# Patient Record
Sex: Female | Born: 1965
Health system: Southern US, Community
[De-identification: ages and names within clinical notes are randomized; demographics above are authoritative.]

## PROBLEM LIST (undated history)

## (undated) DIAGNOSIS — E550 Rickets, active: Secondary | ICD-10-CM

## (undated) DIAGNOSIS — T7840XA Allergy, unspecified, initial encounter: Secondary | ICD-10-CM

## (undated) DIAGNOSIS — E039 Hypothyroidism, unspecified: Secondary | ICD-10-CM

## (undated) DIAGNOSIS — M1990 Unspecified osteoarthritis, unspecified site: Secondary | ICD-10-CM

## (undated) DIAGNOSIS — I10 Essential (primary) hypertension: Secondary | ICD-10-CM

## (undated) HISTORY — PX: TUBAL LIGATION: SHX77

## (undated) HISTORY — DX: Unspecified osteoarthritis, unspecified site: M19.90

## (undated) HISTORY — DX: Rickets, active: E55.0

## (undated) HISTORY — DX: Allergy, unspecified, initial encounter: T78.40XA

## (undated) HISTORY — PX: LEG SURGERY: SHX1003

---

## 2012-08-06 ENCOUNTER — Other Ambulatory Visit: Payer: Self-pay | Admitting: Orthopedic Surgery

## 2012-08-06 MED ORDER — DEXAMETHASONE SODIUM PHOSPHATE 10 MG/ML IJ SOLN: 10.0000 mg | Freq: Once | INTRAMUSCULAR | Status: DC

## 2012-08-06 NOTE — Progress Notes (Signed)
Preoperative surgical orders have been place into the Epic hospital system for Rebecca Miles on 08/06/2012, 5:43 PM  by Patrica Duel for surgery on 09/26/2012.  Preop Femur orders including IV Tylenol, and IV Decadron as long as there are no contraindications to the above medications. Avel Peace, PA-C

## 2012-09-12 ENCOUNTER — Other Ambulatory Visit: Payer: Self-pay | Admitting: Orthopedic Surgery

## 2012-09-12 ENCOUNTER — Encounter (HOSPITAL_COMMUNITY): Payer: Self-pay | Admitting: Pharmacy Technician

## 2012-09-17 ENCOUNTER — Ambulatory Visit (HOSPITAL_COMMUNITY)
Admission: RE | Admit: 2012-09-17 | Discharge: 2012-09-17 | Disposition: A | Discharge status: Home / Self Care | Payer: PRIVATE HEALTH INSURANCE | Source: Ambulatory Visit | Attending: Orthopedic Surgery | Admitting: Orthopedic Surgery

## 2012-09-17 ENCOUNTER — Encounter (HOSPITAL_COMMUNITY): Payer: Self-pay

## 2012-09-17 ENCOUNTER — Encounter (HOSPITAL_COMMUNITY)
Admission: RE | Admit: 2012-09-17 | Discharge: 2012-09-17 | Disposition: A | Discharge status: Home / Self Care | Payer: PRIVATE HEALTH INSURANCE | Source: Ambulatory Visit | Attending: Orthopedic Surgery | Admitting: Orthopedic Surgery

## 2012-09-17 DIAGNOSIS — M4120 Other idiopathic scoliosis, site unspecified: Secondary | ICD-10-CM | POA: Insufficient documentation

## 2012-09-17 DIAGNOSIS — I10 Essential (primary) hypertension: Secondary | ICD-10-CM | POA: Insufficient documentation

## 2012-09-17 DIAGNOSIS — Z01818 Encounter for other preprocedural examination: Secondary | ICD-10-CM | POA: Insufficient documentation

## 2012-09-17 DIAGNOSIS — Z01812 Encounter for preprocedural laboratory examination: Secondary | ICD-10-CM | POA: Insufficient documentation

## 2012-09-17 HISTORY — DX: Essential (primary) hypertension: I10

## 2012-09-17 HISTORY — DX: Hypothyroidism, unspecified: E03.9

## 2012-09-17 HISTORY — DX: Unspecified osteoarthritis, unspecified site: M19.90

## 2012-09-17 LAB — URINALYSIS, ROUTINE W REFLEX MICROSCOPIC
Bilirubin Urine: NEGATIVE
Ketones, ur: NEGATIVE mg/dL
Nitrite: NEGATIVE
pH: 6 (ref 5.0–8.0)

## 2012-09-17 LAB — URINE MICROSCOPIC-ADD ON

## 2012-09-17 LAB — CBC
HCT: 40.9 % (ref 36.0–46.0)
MCV: 86.3 fL (ref 78.0–100.0)
Platelets: 434 10*3/uL — ABNORMAL HIGH (ref 150–400)
RBC: 4.74 MIL/uL (ref 3.87–5.11)
WBC: 8 10*3/uL (ref 4.0–10.5)

## 2012-09-17 LAB — COMPREHENSIVE METABOLIC PANEL
AST: 16 U/L (ref 0–37)
Alkaline Phosphatase: 83 U/L (ref 39–117)
BUN: 7 mg/dL (ref 6–23)
CO2: 28 mEq/L (ref 19–32)
Chloride: 100 mEq/L (ref 96–112)
Creatinine, Ser: 0.62 mg/dL (ref 0.50–1.10)
GFR calc non Af Amer: >90 mL/min (ref >90)
Potassium: 3.5 mEq/L (ref 3.5–5.1)
Total Bilirubin: 0.5 mg/dL (ref 0.3–1.2)

## 2012-09-17 LAB — PROTIME-INR: INR: 1.04 (ref 0.00–1.49)

## 2012-09-17 LAB — SURGICAL PCR SCREEN: Staphylococcus aureus: NEGATIVE

## 2012-09-17 NOTE — Patient Instructions (Signed)
20 Rebecca Miles  09/17/2012   Your procedure is scheduled on: 09/26/12   Report to [facility] at 1015 AM.  Call this number if you have problems the morning of surgery: [phone]   Remember:   Do not eat food:After Midnight.  May have clear liquids:until 0600am then npo  Clear liquids include soda, tea, black coffee, apple or grape juice, broth.  Take these medicines the morning of surgery with A SIP OF WATER:   Do not wear jewelry, make-up or nail polish.  Do not wear lotions, powders, or perfumes.  .  Do not shave 48 hours prior to surgery.    Do not bring valuables to the hospital.  Contacts, dentures or bridgework may not be worn into surgery.  Leave suitcase in the car. After surgery it may be brought to your room.  For patients admitted to the hospital, checkout time is 11:00 AM the day of discharge.         Special Instructions: Incentive Spirometry - Practice and bring it with you on the day of surgery.   Please read over the following fact sheets that you were given: MRSA Information, coughing and deep breathing exercises, leg exercises, Incentive Spirometry Fact Sheet, Blood Transfusion Fact Sheet              Failure to comply with these instructions may result in cancellation of your surgery.                 Patient Signature _______________________________              Nurse signature ________________________________

## 2012-09-18 NOTE — Progress Notes (Signed)
Urinalysis with micro results faxed via EPIC to Dr Lequita Halt.

## 2012-09-19 NOTE — Progress Notes (Signed)
Dr Okey Dupre evaluated confirmed ekg .  No prior EKG done at office of PCP.  No further orders given.

## 2012-09-24 ENCOUNTER — Other Ambulatory Visit: Payer: Self-pay | Admitting: Orthopedic Surgery

## 2012-09-24 NOTE — H&P (Signed)
Rebecca Miles  DOB: 07/09/1966 Separated / Language: English / Race: Black or African American Female  Date of Admission:  09/26/2012  Chief Complaint:  Right Femur Deformity  History of Present Illness The patient is a 47 year old female who comes in for a preoperative History and Physical. The patient is scheduled for a right femoral corrective osteotomy with retrograde IM femoral nailing to be performed by Dr. Frank V. Aluisio, MD at [facility] on 09/26/2012. The patient is a 47 year old female who presents with knee complaints. The patient is seen for a second opinion. The patient reports left knee and right knee symptoms including: pain, giving way, weakness and soreness which began year(s) ago. The patient has the current diagnosis of knee osteoarthritis. Prior to being seen today the patient was previously evaluated by a colleague (Dr. Bassett at [facility]). Previous work-up for this problem has included knee x-rays. Past treatment for this problem has included intra-articular injection of corticosteroids (last injections 04/26/12). Note for "Knee pain": She has been using a cane to ambulate because the knees are bowing so much now. 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 She brought her xrays on a disc today. She's had significant congenital issues with both legs. 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 She's had one operation on her left tibia and two on the right for correction of angular deformity. She's had no problems since then with the tibiae. She still has significant bowing of the femurs. She has significant pain in both knees, right worse than left. It is occurring with activity and with rest. She does not get swelling. She had injections which provided very short-term benefit. Currently the knees are preventing her from doing what she desires. 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  She's not having any hip pain associated with this. No lower extremity weakness or paresthesias. She would like to proceed with surgery to correct her legs. They have been treated conservatively in the past for the above stated problem and despite conservative measures, they continue to have progressive pain and severe functional limitations and dysfunction. They have failed non-operative management including home exercise, medications, and injections. It is felt that they would benefit from undergoing total joint replacement. Risks and benefits of the procedure have been discussed with the patient and they elect to proceed with surgery. There are no active contraindications to surgery such as ongoing infection or rapidly progressive neurological disease.   Problem List Primary osteoarthritis of both knees (715.16) Vitiligo (709.01)   Allergies No Known Drug Allergies   Family History Hypertension. mother and grandmother mothers side Kidney disease. mother Osteoarthritis. grandmother mothers side Diabetes Mellitus. mother Drug / Alcohol Addiction. brother Rheumatoid Arthritis. grandmother mothers side Father. Parkinsonism. Mother. Hypertension, Chronic kidney disease, Hepatitis C. Children. 3 Children (3 sons) with Rickets   Social History Living situation. Lives with family Current work status. disabled Drug/Alcohol Rehab (Currently). no Drug/Alcohol Rehab (Previously). no Marital status. married Tobacco use. former smoker; smoke(d) 1/2 pack(s) per day Number of flights of stairs before winded. 2-3 Pain Contract. no Tobacco / smoke exposure. no Children. 4 Alcohol use. current drinker; drinks wine; only occasionally per week Post-Surgical Plans. Plan is to go home. Current occupation. Certified Nursing Assistant (CNA)   Medication History Armour Thyroid (120MG Tablet, Oral) Active. Diovan HCT (160-12.5MG Tablet, Oral) Active. Multivitamins (  Oral) Active. Ibuprofen (800MG Tablet,   Oral) Active. Tylenol Arthritis Pain (650MG Tablet ER, Oral) Active.  Past Surgical History Tubal Ligation Corrective Left Leg Surgery. Date: 1975. [facility] Corrective Right Leg Surgery. Date: 1982. [facility]   Medical History Rheumatoid Arthritis High blood pressure Osteoarthritis Menopause   Review of Systems General:Not Present- Chills, Fever, Night Sweats, Fatigue, Weight Gain, Weight Loss and Memory Loss. Skin:Not Present- Hives, Itching, Rash, Eczema and Lesions. HEENT:Not Present- Tinnitus, Headache, Double Vision, Visual Loss, Hearing Loss and Dentures. Respiratory:Not Present- Shortness of breath with exertion, Shortness of breath at rest, Allergies, Coughing up blood and Chronic Cough. Cardiovascular:Present- Swelling. Not Present- Chest Pain, Racing/skipping heartbeats, Difficulty Breathing Lying Down, Murmur and Palpitations. Gastrointestinal:Not Present- Bloody Stool, Heartburn, Abdominal Pain, Vomiting, Nausea, Constipation, Diarrhea, Difficulty Swallowing, Jaundice and Loss of appetitie. Female Genitourinary:Present- Urinary frequency. Not Present- Blood in Urine, Weak urinary stream, Discharge, Flank Pain, Incontinence, Painful Urination, Urgency, Urinary Retention and Urinating at Night. Musculoskeletal:Present- Muscle Weakness, Joint Swelling, Joint Pain and Morning Stiffness. Not Present- Muscle Pain, Back Pain and Spasms. Neurological:Not Present- Tremor, Dizziness, Blackout spells, Paralysis, Difficulty with balance and Weakness. Psychiatric:Not Present- Insomnia.   Vitals Weight: 223 lb Height: 58 in Weight was reported by patient. Height was reported by patient. Body Surface Area: 2.03 m Body Mass Index: 46.61 kg/m Pulse: 88 (Regular) Resp.: 16 (Unlabored) BP: 136/84 (Sitting, Right Arm, Standard)    Physical Exam The physical exam findings are as follows:  Note:  Patient is a 47 year old female with continued knee pain. Patient is accompanied today by daughter Jessica and her granddaughter.   General Mental Status - Alert, cooperative and good historian. General Appearance- pleasant. Not in acute distress. Orientation- Oriented X3. Build & Nutrition- Overweight, Well nourished and Well developed. Gait- abnormal and Use of assistive device (4 prong cane).   Head and Neck Head- normocephalic, atraumatic . Neck Global Assessment- supple. no bruit auscultated on the right and no bruit auscultated on the left.   Eye Pupil- Bilateral- Regular and Round. Motion- Bilateral- EOMI.   Chest and Lung Exam Auscultation: Breath sounds:- clear at anterior chest wall and - clear at posterior chest wall. Adventitious sounds:- No Adventitious sounds.   Cardiovascular Auscultation:Rhythm- Regular rate and rhythm. Heart Sounds- S1 WNL and S2 WNL. Murmurs & Other Heart Sounds:Auscultation of the heart reveals - No Murmurs.   Abdomen Inspection:Contour- Generalized moderate distention. Palpation/Percussion:Tenderness- Abdomen is non-tender to palpation. Rigidity (guarding)- Abdomen is soft. Auscultation:Auscultation of the abdomen reveals - Bowel sounds normal.   Female Genitourinary Not done, not pertinent to present illness  Musculoskeletal  Very pleasant, well-developed female, alert and oriented, in no apparent distress. Evaluation of her hips shows normal motion, no discomfort. While standing, she has profound bowing in both femurs. She has procurvatum and varus of the femurs. Her knees show no effusion. There is significant varus at the right knee, range 5-110, marked crepitus on ROM, tender medial greater than lateral with no instability noted. There is left knee varus but to a lesser degree. Range is about 5-120. Moderate crepitus on ROM. Slight tenderness and no instability noted. Pulses, sensation and  motor are intact, both lower extremities. She walks with a horribly antalgic gait pattern.  RADIOGRAPHS: AP and lateral of both femurs, AP and lateral of both knees, show significant angular deformities of about 20 degrees in both femurs. Once again, there is a varus and procurvatum of both femurs, worse on the right than the left. Knees show horrific arthritis, bone on bone, medial and patellofemoral   with large varus deformity, worse on the right than the left.  Assessment & Plan Primary osteoarthritis of both knees (715.16) Bilateral Femoral Deformities  Note: Plan is for a right femoral corrective osteotomy with retrograde IM femoral nialing by Dr. Aluisio.  Plan is to go home following the surgery.  PCP - Dr. Vita Bland  Signed electronically by DREW L PERKINS, PA-C  

## 2012-09-26 ENCOUNTER — Ambulatory Visit (HOSPITAL_COMMUNITY): Payer: PRIVATE HEALTH INSURANCE | Admitting: Anesthesiology

## 2012-09-26 ENCOUNTER — Encounter (HOSPITAL_COMMUNITY): Payer: Self-pay | Admitting: *Deleted

## 2012-09-26 ENCOUNTER — Encounter (HOSPITAL_COMMUNITY)
Admission: RE | Disposition: A | Discharge status: Home Health | Payer: Self-pay | Source: Ambulatory Visit | Attending: Orthopedic Surgery

## 2012-09-26 ENCOUNTER — Ambulatory Visit (HOSPITAL_COMMUNITY): Payer: PRIVATE HEALTH INSURANCE

## 2012-09-26 ENCOUNTER — Encounter (HOSPITAL_COMMUNITY): Payer: Self-pay | Admitting: Anesthesiology

## 2012-09-26 ENCOUNTER — Inpatient Hospital Stay (HOSPITAL_COMMUNITY)
Admission: RE | Admit: 2012-09-26 | Discharge: 2012-09-28 | DRG: 482 | Disposition: A | Discharge status: Home Health | Payer: PRIVATE HEALTH INSURANCE | Source: Ambulatory Visit | Attending: Orthopedic Surgery | Admitting: Orthopedic Surgery

## 2012-09-26 DIAGNOSIS — Q683 Congenital bowing of femur: Secondary | ICD-10-CM

## 2012-09-26 DIAGNOSIS — Q742 Other congenital malformations of lower limb(s), including pelvic girdle: Secondary | ICD-10-CM

## 2012-09-26 DIAGNOSIS — I10 Essential (primary) hypertension: Secondary | ICD-10-CM | POA: Diagnosis present

## 2012-09-26 DIAGNOSIS — Z79899 Other long term (current) drug therapy: Secondary | ICD-10-CM

## 2012-09-26 DIAGNOSIS — Z87891 Personal history of nicotine dependence: Secondary | ICD-10-CM

## 2012-09-26 DIAGNOSIS — L80 Vitiligo: Secondary | ICD-10-CM | POA: Diagnosis present

## 2012-09-26 DIAGNOSIS — M179 Osteoarthritis of knee, unspecified: Secondary | ICD-10-CM

## 2012-09-26 DIAGNOSIS — M069 Rheumatoid arthritis, unspecified: Secondary | ICD-10-CM | POA: Diagnosis present

## 2012-09-26 DIAGNOSIS — E039 Hypothyroidism, unspecified: Secondary | ICD-10-CM | POA: Diagnosis present

## 2012-09-26 DIAGNOSIS — M1710 Unilateral primary osteoarthritis, unspecified knee: Principal | ICD-10-CM | POA: Diagnosis present

## 2012-09-26 HISTORY — PX: TIBIA OSTEOTOMY: SHX1065

## 2012-09-26 HISTORY — PX: FEMUR IM NAIL: SHX1597

## 2012-09-26 LAB — TYPE AND SCREEN
ABO/RH(D): A POS
Antibody Screen: NEGATIVE

## 2012-09-26 LAB — ABO/RH: ABO/RH(D): A POS

## 2012-09-26 SURGERY — OSTEOTOMY, TIBIA
Anesthesia: General | Site: Leg Upper | Laterality: Right

## 2012-09-26 MED ORDER — METOCLOPRAMIDE HCL 5 MG/ML IJ SOLN: 5.0000 mg | Freq: Three times a day (TID) | INTRAMUSCULAR | Status: DC | PRN

## 2012-09-26 MED ORDER — MIDAZOLAM HCL 5 MG/5ML IJ SOLN
INTRAMUSCULAR | Status: DC | PRN
  Administered 2012-09-26: 2 mg via INTRAVENOUS

## 2012-09-26 MED ORDER — KCL IN DEXTROSE-NACL 20-5-0.9 MEQ/L-%-% IV SOLN
INTRAVENOUS | Status: DC
  Administered 2012-09-26 – 2012-09-27 (×2): via INTRAVENOUS
  Filled 2012-09-26 (×4): qty 1000

## 2012-09-26 MED ORDER — CHLORHEXIDINE GLUCONATE 4 % EX LIQD
60.0000 mL | Freq: Once | CUTANEOUS | Status: DC
  Filled 2012-09-26: qty 60

## 2012-09-26 MED ORDER — IRBESARTAN 150 MG PO TABS
150.0000 mg | ORAL_TABLET | Freq: Every day | ORAL | Status: DC
  Administered 2012-09-27 – 2012-09-28 (×2): 150 mg via ORAL
  Filled 2012-09-26 (×2): qty 1

## 2012-09-26 MED ORDER — DEXAMETHASONE SODIUM PHOSPHATE 10 MG/ML IJ SOLN
INTRAMUSCULAR | Status: DC | PRN
  Administered 2012-09-26: 10 mg via INTRAVENOUS

## 2012-09-26 MED ORDER — 0.9 % SODIUM CHLORIDE (POUR BTL) OPTIME
TOPICAL | Status: DC | PRN
  Administered 2012-09-26: 1000 mL

## 2012-09-26 MED ORDER — FLEET ENEMA 7-19 GM/118ML RE ENEM: 1.0000 | ENEMA | Freq: Once | RECTAL | Status: AC | PRN

## 2012-09-26 MED ORDER — CISATRACURIUM BESYLATE (PF) 10 MG/5ML IV SOLN
INTRAVENOUS | Status: DC | PRN
  Administered 2012-09-26: 10 mg via INTRAVENOUS

## 2012-09-26 MED ORDER — OXYCODONE HCL 5 MG PO TABS
5.0000 mg | ORAL_TABLET | ORAL | Status: DC | PRN
  Administered 2012-09-27: 5 mg via ORAL
  Administered 2012-09-27 (×2): 10 mg via ORAL
  Administered 2012-09-27: 5 mg via ORAL
  Administered 2012-09-27 – 2012-09-28 (×2): 10 mg via ORAL
  Filled 2012-09-26 (×5): qty 2
  Filled 2012-09-26: qty 1

## 2012-09-26 MED ORDER — LIDOCAINE HCL (CARDIAC) 20 MG/ML IV SOLN
INTRAVENOUS | Status: DC | PRN
  Administered 2012-09-26: 50 mg via INTRAVENOUS
  Administered 2012-09-26: 100 mg via INTRAVENOUS

## 2012-09-26 MED ORDER — PROPOFOL 10 MG/ML IV BOLUS
INTRAVENOUS | Status: DC | PRN
  Administered 2012-09-26: 200 mg via INTRAVENOUS

## 2012-09-26 MED ORDER — POLYETHYLENE GLYCOL 3350 17 G PO PACK: 17.0000 g | PACK | Freq: Every day | ORAL | Status: DC | PRN

## 2012-09-26 MED ORDER — TRAMADOL HCL 50 MG PO TABS
50.0000 mg | ORAL_TABLET | Freq: Four times a day (QID) | ORAL | Status: DC | PRN
  Administered 2012-09-26: 100 mg via ORAL
  Filled 2012-09-26: qty 2

## 2012-09-26 MED ORDER — VALSARTAN-HYDROCHLOROTHIAZIDE 160-12.5 MG PO TABS: 1.0000 | ORAL_TABLET | Freq: Every day | ORAL | Status: DC

## 2012-09-26 MED ORDER — ACETAMINOPHEN 10 MG/ML IV SOLN
1000.0000 mg | Freq: Once | INTRAVENOUS | Status: AC
  Administered 2012-09-26: 1000 mg via INTRAVENOUS

## 2012-09-26 MED ORDER — ONDANSETRON HCL 4 MG/2ML IJ SOLN
INTRAMUSCULAR | Status: DC | PRN
  Administered 2012-09-26: 4 mg via INTRAVENOUS

## 2012-09-26 MED ORDER — LACTATED RINGERS IV SOLN
INTRAVENOUS | Status: DC
  Administered 2012-09-26: 1000 mL via INTRAVENOUS

## 2012-09-26 MED ORDER — METHYLPREDNISOLONE ACETATE 40 MG/ML IJ SUSP
INTRAMUSCULAR | Status: AC
  Filled 2012-09-26: qty 1

## 2012-09-26 MED ORDER — ENOXAPARIN SODIUM 40 MG/0.4ML ~~LOC~~ SOLN
40.0000 mg | SUBCUTANEOUS | Status: DC
  Administered 2012-09-27 – 2012-09-28 (×2): 40 mg via SUBCUTANEOUS
  Filled 2012-09-26 (×3): qty 0.4

## 2012-09-26 MED ORDER — ONDANSETRON HCL 4 MG PO TABS: 4.0000 mg | ORAL_TABLET | Freq: Four times a day (QID) | ORAL | Status: DC | PRN

## 2012-09-26 MED ORDER — CEFAZOLIN SODIUM-DEXTROSE 2-3 GM-% IV SOLR
2.0000 g | INTRAVENOUS | Status: AC
  Administered 2012-09-26: 2 g via INTRAVENOUS

## 2012-09-26 MED ORDER — MORPHINE SULFATE 2 MG/ML IJ SOLN
1.0000 mg | INTRAMUSCULAR | Status: DC | PRN
  Administered 2012-09-26: 2 mg via INTRAVENOUS
  Administered 2012-09-26: 1 mg via INTRAVENOUS
  Administered 2012-09-26: 2 mg via INTRAVENOUS
  Filled 2012-09-26 (×3): qty 1

## 2012-09-26 MED ORDER — SUCCINYLCHOLINE CHLORIDE 20 MG/ML IJ SOLN
INTRAMUSCULAR | Status: DC | PRN
  Administered 2012-09-26: 100 mg via INTRAVENOUS

## 2012-09-26 MED ORDER — CEFAZOLIN SODIUM-DEXTROSE 2-3 GM-% IV SOLR
2.0000 g | Freq: Four times a day (QID) | INTRAVENOUS | Status: AC
  Administered 2012-09-26 – 2012-09-27 (×3): 2 g via INTRAVENOUS
  Filled 2012-09-26 (×4): qty 50

## 2012-09-26 MED ORDER — BISACODYL 10 MG RE SUPP: 10.0000 mg | Freq: Every day | RECTAL | Status: DC | PRN

## 2012-09-26 MED ORDER — CEFAZOLIN SODIUM-DEXTROSE 2-3 GM-% IV SOLR
INTRAVENOUS | Status: AC
  Filled 2012-09-26: qty 50

## 2012-09-26 MED ORDER — ONDANSETRON HCL 4 MG/2ML IJ SOLN: 4.0000 mg | Freq: Four times a day (QID) | INTRAMUSCULAR | Status: DC | PRN

## 2012-09-26 MED ORDER — METOCLOPRAMIDE HCL 10 MG PO TABS: 5.0000 mg | ORAL_TABLET | Freq: Three times a day (TID) | ORAL | Status: DC | PRN

## 2012-09-26 MED ORDER — METHYLPREDNISOLONE ACETATE 80 MG/ML IJ SUSP
INTRAMUSCULAR | Status: DC | PRN
  Administered 2012-09-26: 80 mg via INTRA_ARTICULAR

## 2012-09-26 MED ORDER — THYROID 60 MG PO TABS
120.0000 mg | ORAL_TABLET | Freq: Every day | ORAL | Status: DC
Start: 2012-09-27 — End: 2012-09-28
  Administered 2012-09-27 – 2012-09-28 (×2): 120 mg via ORAL
  Filled 2012-09-26 (×3): qty 2

## 2012-09-26 MED ORDER — METHOCARBAMOL 100 MG/ML IJ SOLN
500.0000 mg | Freq: Four times a day (QID) | INTRAVENOUS | Status: DC | PRN
  Administered 2012-09-26: 500 mg via INTRAVENOUS
  Filled 2012-09-26: qty 5

## 2012-09-26 MED ORDER — ACETAMINOPHEN 10 MG/ML IV SOLN
INTRAVENOUS | Status: AC
  Filled 2012-09-26: qty 100

## 2012-09-26 MED ORDER — HYDROMORPHONE HCL PF 1 MG/ML IJ SOLN
INTRAMUSCULAR | Status: DC | PRN
  Administered 2012-09-26 (×2): .4 mg via INTRAVENOUS
  Administered 2012-09-26 (×2): .6 mg via INTRAVENOUS

## 2012-09-26 MED ORDER — STERILE WATER FOR IRRIGATION IR SOLN
Status: DC | PRN
  Administered 2012-09-26: 3000 mL

## 2012-09-26 MED ORDER — HYDROMORPHONE HCL PF 1 MG/ML IJ SOLN
INTRAMUSCULAR | Status: AC
  Filled 2012-09-26: qty 1

## 2012-09-26 MED ORDER — KETAMINE HCL 10 MG/ML IJ SOLN
INTRAMUSCULAR | Status: DC | PRN
  Administered 2012-09-26: 30 mg via INTRAVENOUS
  Administered 2012-09-26 (×2): 10 mg via INTRAVENOUS

## 2012-09-26 MED ORDER — LACTATED RINGERS IV SOLN
INTRAVENOUS | Status: DC | PRN
  Administered 2012-09-26 (×3): via INTRAVENOUS

## 2012-09-26 MED ORDER — BUPIVACAINE HCL (PF) 0.25 % IJ SOLN
INTRAMUSCULAR | Status: AC
  Filled 2012-09-26: qty 30

## 2012-09-26 MED ORDER — HYDROCHLOROTHIAZIDE 12.5 MG PO CAPS
12.5000 mg | ORAL_CAPSULE | Freq: Every day | ORAL | Status: DC
  Administered 2012-09-27 – 2012-09-28 (×2): 12.5 mg via ORAL
  Filled 2012-09-26 (×2): qty 1

## 2012-09-26 MED ORDER — HYDROMORPHONE HCL PF 1 MG/ML IJ SOLN
0.2500 mg | INTRAMUSCULAR | Status: DC | PRN
  Administered 2012-09-26: 0.5 mg via INTRAVENOUS
  Administered 2012-09-26: 0.25 mg via INTRAVENOUS
  Administered 2012-09-26: 0.5 mg via INTRAVENOUS

## 2012-09-26 MED ORDER — METHOCARBAMOL 500 MG PO TABS
500.0000 mg | ORAL_TABLET | Freq: Four times a day (QID) | ORAL | Status: DC | PRN
  Administered 2012-09-27 – 2012-09-28 (×3): 500 mg via ORAL
  Filled 2012-09-26 (×3): qty 1

## 2012-09-26 MED ORDER — BUPIVACAINE HCL (PF) 0.25 % IJ SOLN
INTRAMUSCULAR | Status: DC | PRN
  Administered 2012-09-26: 5 mL via INTRA_ARTICULAR

## 2012-09-26 MED ORDER — DOCUSATE SODIUM 100 MG PO CAPS
100.0000 mg | ORAL_CAPSULE | Freq: Two times a day (BID) | ORAL | Status: DC
  Administered 2012-09-26 – 2012-09-28 (×4): 100 mg via ORAL

## 2012-09-26 MED ORDER — SODIUM CHLORIDE 0.9 % IV SOLN: INTRAVENOUS | Status: DC

## 2012-09-26 MED ORDER — SUFENTANIL CITRATE 50 MCG/ML IV SOLN
INTRAVENOUS | Status: DC | PRN
  Administered 2012-09-26 (×2): 10 ug via INTRAVENOUS
  Administered 2012-09-26: 20 ug via INTRAVENOUS
  Administered 2012-09-26: 10 ug via INTRAVENOUS

## 2012-09-26 SURGICAL SUPPLY — 65 items
BAG ZIPLOCK 12X15 (MISCELLANEOUS) ×3 IMPLANT
BANDAGE ELASTIC 6 VELCRO ST LF (GAUZE/BANDAGES/DRESSINGS) ×3 IMPLANT
BANDAGE ESMARK 6X9 LF (GAUZE/BANDAGES/DRESSINGS) ×2 IMPLANT
BANDAGE GAUZE ELAST BULKY 4 IN (GAUZE/BANDAGES/DRESSINGS) ×3 IMPLANT
BIT DRILL CALIBRATED 4.3MMX365 (DRILL) ×4 IMPLANT
BIT DRILL CROWE PNT TWST 4.5MM (DRILL) ×2 IMPLANT
BLADE SAW SGTL 18X1.27X75 (BLADE) ×3 IMPLANT
BNDG COHESIVE 4X5 TAN STRL (GAUZE/BANDAGES/DRESSINGS) ×3 IMPLANT
BNDG ESMARK 6X9 LF (GAUZE/BANDAGES/DRESSINGS) ×3
CLOTH BEACON ORANGE TIMEOUT ST (SAFETY) ×3 IMPLANT
CLSR STERI-STRIP ANTIMIC 1/2X4 (GAUZE/BANDAGES/DRESSINGS) ×3 IMPLANT
DRAPE EXTREMITY T 121X128X90 (DRAPE) ×3 IMPLANT
DRAPE INCISE IOBAN 66X45 STRL (DRAPES) ×3 IMPLANT
DRAPE LG THREE QUARTER DISP (DRAPES) ×6 IMPLANT
DRAPE STERI IOBAN 125X83 (DRAPES) ×3 IMPLANT
DRAPE U-SHAPE 47X51 STRL (DRAPES) ×3 IMPLANT
DRILL CALIBRATED 4.3MMX365 (DRILL) ×6
DRILL CROWE POINT TWIST 4.5MM (DRILL) ×3
DRSG ADAPTIC 3X8 NADH LF (GAUZE/BANDAGES/DRESSINGS) ×3 IMPLANT
DRSG EMULSION OIL 3X3 NADH (GAUZE/BANDAGES/DRESSINGS) ×6 IMPLANT
DRSG MEPILEX BORDER 4X4 (GAUZE/BANDAGES/DRESSINGS) ×3 IMPLANT
DRSG MEPILEX BORDER 4X8 (GAUZE/BANDAGES/DRESSINGS) ×3 IMPLANT
DRSG PAD ABDOMINAL 8X10 ST (GAUZE/BANDAGES/DRESSINGS) ×3 IMPLANT
DURAPREP 26ML APPLICATOR (WOUND CARE) ×3 IMPLANT
ELECT REM PT RETURN 9FT ADLT (ELECTROSURGICAL) ×3
ELECTRODE REM PT RTRN 9FT ADLT (ELECTROSURGICAL) ×2 IMPLANT
GLOVE BIO SURGEON STRL SZ7.5 (GLOVE) ×3 IMPLANT
GLOVE BIO SURGEON STRL SZ8 (GLOVE) ×3 IMPLANT
GLOVE BIOGEL M 6.5 STRL (GLOVE) ×6 IMPLANT
GLOVE BIOGEL PI IND STRL 8 (GLOVE) ×4 IMPLANT
GLOVE BIOGEL PI INDICATOR 8 (GLOVE) ×2
GOWN STRL NON-REIN LRG LVL3 (GOWN DISPOSABLE) ×3 IMPLANT
GOWN STRL REIN XL XLG (GOWN DISPOSABLE) ×3 IMPLANT
GUIDEWIRE BEAD TIP (WIRE) ×6 IMPLANT
GUIDEWIRE LAGSCREW 3.2X460 (WIRE) ×3 IMPLANT
KIT BASIN OR (CUSTOM PROCEDURE TRAY) ×3 IMPLANT
LAG SCREW GUIDE WIRE IMPLANT
MANIFOLD NEPTUNE II (INSTRUMENTS) ×3 IMPLANT
NS IRRIG 1000ML POUR BTL (IV SOLUTION) ×3 IMPLANT
PACK LOWER EXTREMITY WL (CUSTOM PROCEDURE TRAY) ×3 IMPLANT
PACK TOTAL JOINT (CUSTOM PROCEDURE TRAY) ×3 IMPLANT
PAD CAST 4YDX4 CTTN HI CHSV (CAST SUPPLIES) ×4 IMPLANT
PADDING CAST COTTON 4X4 STRL (CAST SUPPLIES) ×2
PADDING CAST COTTON 6X4 STRL (CAST SUPPLIES) ×3 IMPLANT
POSITIONER SURGICAL ARM (MISCELLANEOUS) ×3 IMPLANT
SAGITTAL BLADE IMPLANT
SCREW CORT TI DBL LEAD 5X34 (Screw) ×3 IMPLANT
SCREW CORT TI DBL LEAD 5X56 (Screw) ×3 IMPLANT
SCREW CORT TI DBL LEAD 5X80 (Screw) ×3 IMPLANT
SET PAD KNEE POSITIONER (MISCELLANEOUS) IMPLANT
SPONGE GAUZE 4X4 12PLY (GAUZE/BANDAGES/DRESSINGS) ×3 IMPLANT
STAPLER VISISTAT (STAPLE) ×3 IMPLANT
STAPLER VISISTAT 35W (STAPLE) ×3 IMPLANT
STRIP CLOSURE SKIN 1/2X4 (GAUZE/BANDAGES/DRESSINGS) ×3 IMPLANT
SUT MNCRL AB 4-0 PS2 18 (SUTURE) ×3 IMPLANT
SUT VIC AB 0 CT1 27 (SUTURE) ×1
SUT VIC AB 0 CT1 27XBRD ANTBC (SUTURE) ×2 IMPLANT
SUT VIC AB 1 CT1 27 (SUTURE) ×3
SUT VIC AB 1 CT1 27XBRD ANTBC (SUTURE) ×6 IMPLANT
SUT VIC AB 2-0 CT1 27 (SUTURE) ×3
SUT VIC AB 2-0 CT1 TAPERPNT 27 (SUTURE) ×6 IMPLANT
TOWEL OR 17X26 10 PK STRL BLUE (TOWEL DISPOSABLE) ×6 IMPLANT
TRAY FOLEY CATH 14FRSI W/METER (CATHETERS) ×3 IMPLANT
WATER STERILE IRR 1500ML POUR (IV SOLUTION) ×3 IMPLANT
femoral nail retro 10.5mm x 300mm ×3 IMPLANT

## 2012-09-26 NOTE — Anesthesia Postprocedure Evaluation (Signed)
  Anesthesia Post-op Note  Patient: Rebecca Miles  Procedure(s) Performed: Procedure(s) (LRB): TIBIAL OSTEOTOMY (Right) INTRAMEDULLARY (IM) RETROGRADE FEMORAL NAILING (Right)  Patient Location: PACU  Anesthesia Type: General  Level of Consciousness: awake and alert   Airway and Oxygen Therapy: Patient Spontanous Breathing  Post-op Pain: mild  Post-op Assessment: Post-op Vital signs reviewed, Patient's Cardiovascular Status Stable, Respiratory Function Stable, Patent Airway and No signs of Nausea or vomiting  Last Vitals:  Filed Vitals:   09/26/12 1600  BP: 142/83  Pulse: 95  Temp: 36.8 C  Resp: 17    Post-op Vital Signs: stable   Complications: No apparent anesthesia complications

## 2012-09-26 NOTE — H&P (View-Only) (Signed)
Rebecca Miles  DOB: 12-Jun-1966 Separated / Language: [place] / Race: Black or African American Female  Date of Admission:  09/26/2012  Chief Complaint:  Right Femur Deformity  History of Present Illness The patient is a 47 year old female who comes in for a preoperative History and Physical. The patient is scheduled for a right femoral corrective osteotomy with retrograde IM femoral nailing to be performed by Dr. Gus Rankin. Aluisio, MD at [facility] on 09/26/2012. The patient is a 47 year old female who presents with knee complaints. The patient is seen for a second opinion. The patient reports left knee and right knee symptoms including: pain, giving way, weakness and soreness which began year(s) ago. The patient has the current diagnosis of knee osteoarthritis. Prior to being seen today the patient was previously evaluated by a colleague (Dr. Cleophas Dunker at [facility]). Previous work-up for this problem has included knee x-rays. Past treatment for this problem has included intra-articular injection of corticosteroids (last injections 04/26/12). Note for "Knee pain": She has been using a cane to ambulate because the knees are bowing so much now. 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 She brought her xrays on a disc today. She's had significant congenital issues with both legs. 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 She's had one operation on her left tibia and two on the right for correction of angular deformity. She's had no problems since then with the tibiae. She still has significant bowing of the femurs. She has significant pain in both knees, right worse than left. It is occurring with activity and with rest. She does not get swelling. She had injections which provided very short-term benefit. Currently the knees are preventing her from doing what she desires. 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  She's not having any hip pain associated with this. No lower extremity weakness or paresthesias. She would like to proceed with surgery to correct her legs. They have been treated conservatively in the past for the above stated problem and despite conservative measures, they continue to have progressive pain and severe functional limitations and dysfunction. They have failed non-operative management including home exercise, medications, and injections. It is felt that they would benefit from undergoing total joint replacement. Risks and benefits of the procedure have been discussed with the patient and they elect to proceed with surgery. There are no active contraindications to surgery such as ongoing infection or rapidly progressive neurological disease.   Problem List Primary osteoarthritis of both knees (715.16) Vitiligo (709.01)   Allergies No Known Drug Allergies   Family History Hypertension. mother and grandmother mothers side Kidney disease. mother Osteoarthritis. grandmother mothers side Diabetes Mellitus. mother Drug / Alcohol Addiction. brother Rheumatoid Arthritis. grandmother mothers side Father. Parkinsonism. Mother. Hypertension, Chronic kidney disease, Hepatitis C. Children. 3 Children (3 sons) with Rickets   Social History Living situation. Lives with family Current work status. disabled Drug/Alcohol Rehab (Currently). no Drug/Alcohol Rehab (Previously). no Marital status. married Tobacco use. former smoker; smoke(d) 1/2 pack(s) per day Number of flights of stairs before winded. 2-3 Pain Contract. no Tobacco / smoke exposure. no Children. 4 Alcohol use. current drinker; drinks wine; only occasionally per week Post-Surgical Plans. Plan is to go home. Current occupation. Certified Nursing Assistant (CNA)   Medication History Armour Thyroid (120MG  Tablet, Oral) Active. Diovan HCT (160-12.5MG  Tablet, Oral) Active. Multivitamins (  Oral) Active. Ibuprofen (800MG  Tablet,  Oral) Active. Tylenol Arthritis Pain (650MG  Tablet ER, Oral) Active.  Past Surgical History Tubal Ligation Corrective Left Leg Surgery. Date: 65. [facility] Corrective Right Leg Surgery. Date: 89. [facility]   Medical History Rheumatoid Arthritis High blood pressure Osteoarthritis Menopause   Review of Systems General:Not Present- Chills, Fever, Night Sweats, Fatigue, Weight Gain, Weight Loss and Memory Loss. Skin:Not Present- Hives, Itching, Rash, Eczema and Lesions. HEENT:Not Present- Tinnitus, Headache, Double Vision, Visual Loss, Hearing Loss and Dentures. Respiratory:Not Present- Shortness of breath with exertion, Shortness of breath at rest, Allergies, Coughing up blood and Chronic Cough. Cardiovascular:Present- Swelling. Not Present- Chest Pain, Racing/skipping heartbeats, Difficulty Breathing Lying Down, Murmur and Palpitations. Gastrointestinal:Not Present- Bloody Stool, Heartburn, Abdominal Pain, Vomiting, Nausea, Constipation, Diarrhea, Difficulty Swallowing, Jaundice and Loss of appetitie. Female Genitourinary:Present- Urinary frequency. Not Present- Blood in Urine, Weak urinary stream, Discharge, Flank Pain, Incontinence, Painful Urination, Urgency, Urinary Retention and Urinating at Night. Musculoskeletal:Present- Muscle Weakness, Joint Swelling, Joint Pain and Morning Stiffness. Not Present- Muscle Pain, Back Pain and Spasms. Neurological:Not Present- Tremor, Dizziness, Blackout spells, Paralysis, Difficulty with balance and Weakness. Psychiatric:Not Present- Insomnia.   Vitals Weight: 223 lb Height: 58 in Weight was reported by patient. Height was reported by patient. Body Surface Area: 2.03 m Body Mass Index: 46.61 kg/m Pulse: 88 (Regular) Resp.: 16 (Unlabored) BP: 136/84 (Sitting, Right Arm, Standard)    Physical Exam The physical exam findings are as follows:  Note:  Patient is a 47 year old female with continued knee pain. Patient is accompanied today by daughter Shanda Bumps and her granddaughter.   General Mental Status - Alert, cooperative and good historian. General Appearance- pleasant. Not in acute distress. Orientation- Oriented X3. Build & Nutrition- Overweight, Well nourished and Well developed. Gait- abnormal and Use of assistive device (4 prong cane).   Head and Neck Head- normocephalic, atraumatic . Neck Global Assessment- supple. no bruit auscultated on the right and no bruit auscultated on the left.   Eye Pupil- Bilateral- Regular and Round. Motion- Bilateral- EOMI.   Chest and Lung Exam Auscultation: Breath sounds:- clear at anterior chest wall and - clear at posterior chest wall. Adventitious sounds:- No Adventitious sounds.   Cardiovascular Auscultation:Rhythm- Regular rate and rhythm. Heart Sounds- S1 WNL and S2 WNL. Murmurs & Other Heart Sounds:Auscultation of the heart reveals - No Murmurs.   Abdomen Inspection:Contour- Generalized moderate distention. Palpation/Percussion:Tenderness- Abdomen is non-tender to palpation. Rigidity (guarding)- Abdomen is soft. Auscultation:Auscultation of the abdomen reveals - Bowel sounds normal.   Female Genitourinary Not done, not pertinent to present illness  Musculoskeletal  Very pleasant, well-developed female, alert and oriented, in no apparent distress. Evaluation of her hips shows normal motion, no discomfort. While standing, she has profound bowing in both femurs. She has procurvatum and varus of the femurs. Her knees show no effusion. There is significant varus at the right knee, range 5-110, marked crepitus on ROM, tender medial greater than lateral with no instability noted. There is left knee varus but to a lesser degree. Range is about 5-120. Moderate crepitus on ROM. Slight tenderness and no instability noted. Pulses, sensation and  motor are intact, both lower extremities. She walks with a horribly antalgic gait pattern.  RADIOGRAPHS: AP and lateral of both femurs, AP and lateral of both knees, show significant angular deformities of about 20 degrees in both femurs. Once again, there is a varus and procurvatum of both femurs, worse on the right than the left. Knees show horrific arthritis, bone on bone, medial and patellofemoral  with large varus deformity, worse on the right than the left.  Assessment & Plan Primary osteoarthritis of both knees (715.16) Bilateral Femoral Deformities  Note: Plan is for a right femoral corrective osteotomy with retrograde IM femoral nialing by Dr. Lequita Halt.  Plan is to go home following the surgery.  PCP - Dr. Ocie Bob  Signed electronically by Roberts Gaudy, PA-C

## 2012-09-26 NOTE — Brief Op Note (Signed)
09/26/2012  3:12 PM  PATIENT:  Rebecca Miles  47 y.o. female  PRE-OPERATIVE DIAGNOSIS:  ANGULAR DEFORMITY OF RIGHT FEMUR with OA knee 2. OA left Knee  POST-OPERATIVE DIAGNOSIS: 1. ANGULAR DEFORMITY OF RIGHT FEMUR with OA knee 2. OA left knee  PROCEDURE:  Procedure(s) (LRB) with comments: TIBIAL OSTEOTOMY (Right) - RIGHT FEMORAL CORRECTIVE OSTEOMITY WITH RETROGRADE IM FEMORAL NAIL INTRAMEDULLARY (IM) RETROGRADE FEMORAL NAILING (Right) Left knee cortisone injection  SURGEON:  Surgeon(s) and Role:    * Loanne Drilling, MD - Primary  PHYSICIAN ASSISTANT:   ASSISTANTS: Avel Peace, PA-C   ANESTHESIA:   general  EBL:  Total I/O In: 2000 [I.V.:2000] Out: 325 [Urine:75; Blood:250]  BLOOD ADMINISTERED:none  DRAINS: none   COUNTS:  YES  TOURNIQUET:  * Missing tourniquet times found for documented tourniquets in log:  [id number] *  DICTATION: .Other Dictation: Dictation Number [id number]  PLAN OF CARE: Admit to inpatient   PATIENT DISPOSITION:  PACU - hemodynamically stable.

## 2012-09-26 NOTE — Interval H&P Note (Signed)
History and Physical Interval Note:  09/26/2012 1:02 PM  Rebecca Miles  has presented today for surgery, with the diagnosis of ANGULAR DEFORMITY OF RIGHT FEMUR  The various methods of treatment have been discussed with the patient and family. After consideration of risks, benefits and other options for treatment, the patient has consented to  Procedure(s) (LRB) with comments: TIBIAL OSTEOTOMY (Right) - RIGHT FEMORAL CORRECTIVE OSTEOMITY WITH RETROGRADE IM FEMORAL NAIL INTRAMEDULLARY (IM) RETROGRADE FEMORAL NAILING (Right) as a surgical intervention .  The patient's history has been reviewed, patient examined, no change in status, stable for surgery.  I have reviewed the patient's chart and labs.  Questions were answered to the patient's satisfaction.     Loanne Drilling

## 2012-09-26 NOTE — Anesthesia Procedure Notes (Signed)
Procedure Name: Intubation Date/Time: 09/26/2012 1:15 PM Performed by: Leroy Libman L Patient Re-evaluated:Patient Re-evaluated prior to inductionOxygen Delivery Method: Circle system utilized Preoxygenation: Pre-oxygenation with 100% oxygen Intubation Type: IV induction Ventilation: Mask ventilation without difficulty and Oral airway inserted - appropriate to patient size Laryngoscope Size: Miller and 2 Grade View: Grade I Tube size: 7.5 mm Number of attempts: 1 Airway Equipment and Method: Stylet Placement Confirmation: ETT inserted through vocal cords under direct vision,  breath sounds checked- equal and bilateral and positive ETCO2 Secured at: 20 cm Tube secured with: Tape Dental Injury: Teeth and Oropharynx as per pre-operative assessment

## 2012-09-26 NOTE — Preoperative (Signed)
Beta Blockers   Reason not to administer Beta Blockers:Not Applicable 

## 2012-09-26 NOTE — Transfer of Care (Signed)
Immediate Anesthesia Transfer of Care Note  Patient: [name]  Procedure(s) Performed: Procedure(s) (LRB) with comments: TIBIAL OSTEOTOMY (Right) - RIGHT FEMORAL CORRECTIVE OSTEOMITY WITH RETROGRADE IM FEMORAL NAIL INTRAMEDULLARY (IM) RETROGRADE FEMORAL NAILING (Right)  Patient Location: PACU  Anesthesia Type:General  Level of Consciousness: awake, alert , oriented and patient cooperative  Airway & Oxygen Therapy: Patient Spontanous Breathing and Patient connected to face mask oxygen  Post-op Assessment: Report given to PACU RN and Post -op Vital signs reviewed and stable  Post vital signs: Reviewed and stable  Complications: No apparent anesthesia complications

## 2012-09-26 NOTE — Anesthesia Preprocedure Evaluation (Addendum)
Anesthesia Evaluation  Patient identified by MRN, date of birth, ID band Patient awake    Reviewed: Allergy & Precautions, H&P , NPO status , Patient's Chart, lab work & pertinent test results  Airway Mallampati: II TM Distance: >3 FB Neck ROM: full    Dental  (+) Edentulous Upper and Edentulous Lower   Pulmonary neg pulmonary ROS,  breath sounds clear to auscultation  Pulmonary exam normal       Cardiovascular Exercise Tolerance: Good hypertension, Pt. on medications Rhythm:regular Rate:Normal     Neuro/Psych negative neurological ROS  negative psych ROS   GI/Hepatic negative GI ROS, Neg liver ROS,   Endo/Other  Hypothyroidism   Renal/GU negative Renal ROS  negative genitourinary   Musculoskeletal   Abdominal   Peds  Hematology negative hematology ROS (+)   Anesthesia Other Findings   Reproductive/Obstetrics negative OB ROS                          Anesthesia Physical Anesthesia Plan  ASA: II  Anesthesia Plan: General   Post-op Pain Management:    Induction: Intravenous  Airway Management Planned: Oral ETT  Additional Equipment:   Intra-op Plan:   Post-operative Plan: Extubation in OR  Informed Consent: I have reviewed the patients History and Physical, chart, labs and discussed the procedure including the risks, benefits and alternatives for the proposed anesthesia with the patient or authorized representative who has indicated his/her understanding and acceptance.   Dental Advisory Given  Plan Discussed with: CRNA and Surgeon  Anesthesia Plan Comments:         Anesthesia Quick Evaluation

## 2012-09-27 ENCOUNTER — Encounter (HOSPITAL_COMMUNITY): Payer: Self-pay | Admitting: Orthopedic Surgery

## 2012-09-27 LAB — CBC
Hemoglobin: 11.7 g/dL — ABNORMAL LOW (ref 12.0–15.0)
MCH: 30.2 pg (ref 26.0–34.0)
MCHC: 34.5 g/dL (ref 30.0–36.0)
RDW: 12.8 % (ref 11.5–15.5)

## 2012-09-27 LAB — BASIC METABOLIC PANEL
Calcium: 9 mg/dL (ref 8.4–10.5)
GFR calc Af Amer: >90 mL/min (ref >90)
GFR calc non Af Amer: >90 mL/min (ref >90)
Glucose, Bld: 159 mg/dL — ABNORMAL HIGH (ref 70–99)
Potassium: 3.8 mEq/L (ref 3.5–5.1)
Sodium: 137 mEq/L (ref 135–145)

## 2012-09-27 MED ORDER — KCL IN DEXTROSE-NACL 20-5-0.9 MEQ/L-%-% IV SOLN
INTRAVENOUS | Status: AC
  Filled 2012-09-27: qty 1000

## 2012-09-27 MED ORDER — SODIUM CHLORIDE 0.9 % IV BOLUS (SEPSIS)
500.0000 mL | Freq: Once | INTRAVENOUS | Status: AC
  Administered 2012-09-27: 500 mL via INTRAVENOUS

## 2012-09-27 NOTE — Evaluation (Signed)
Physical Therapy Evaluation Patient Details Name: Rebecca Miles MRN: [id number] DOB: [birth year] Today's Date: 09/27/2012 Time: 4098-1191 PT Time Calculation (min): 26 min  PT Assessment / Plan / Recommendation Clinical Impression   1 Day Post-Op TIBIAL OSTEOTOMY (Right), IM femoral nail; will benefit from PT to maximize independence for home setting    PT Assessment  Patient needs continued PT services    Follow Up Recommendations  Home health PT    Does the patient have the potential to tolerate intense rehabilitation      Barriers to Discharge        Equipment Recommendations  None recommended by PT    Recommendations for Other Services     Frequency Min 6X/week    Precautions / Restrictions Precautions Precautions: None Restrictions RLE Weight Bearing: Partial weight bearing RLE Partial Weight Bearing Percentage or Pounds: 50%   Pertinent Vitals/Pain Resting HR 122 During/after acitvity 130-125  pt reports decreased pain after transfer/mobility      Mobility  Bed Mobility Bed Mobility: Supine to Sit Supine to Sit: 4: Min assist;3: Mod assist Details for Bed Mobility Assistance: increased time, cues for technique Transfers Transfers: Sit to Stand;Stand to Sit Sit to Stand: 4: Min assist;3: Mod assist;From bed Stand to Sit: 4: Min assist;To chair/3-in-1 Details for Transfer Assistance: cues for hand placemetn and wt shift Ambulation/Gait Ambulation/Gait Assistance: 4: Min assist Ambulation Distance (Feet): 6 Feet Assistive device: Rolling walker Ambulation/Gait Assistance Details: cues for sequence, Rw distance form self, posture, and PWB RLE Gait Pattern: Step-to pattern;Decreased stance time - right;Trunk flexed General Gait Details: increased time    Shoulder Instructions     Exercises General Exercises - Lower Extremity Ankle Circles/Pumps: AROM;Both;10 reps   PT Diagnosis: Difficulty walking  PT Problem List: Decreased strength;Decreased range  of motion;Decreased activity tolerance;Decreased balance;Decreased mobility;Decreased knowledge of use of DME;Obesity PT Treatment Interventions: DME instruction;Gait training;Functional mobility training;Therapeutic activities;Stair training;Therapeutic exercise;Balance training;Patient/family education   PT Goals Acute Rehab PT Goals PT Goal Formulation: With patient Time For Goal Achievement: 10/04/12 Potential to Achieve Goals: Good Pt will go Supine/Side to Sit: with min assist;with supervision PT Goal: Supine/Side to Sit - Progress: Goal set today Pt will go Sit to Supine/Side: with min assist;with supervision PT Goal: Sit to Supine/Side - Progress: Goal set today Pt will go Sit to Stand: with supervision PT Goal: Sit to Stand - Progress: Goal set today Pt will go Stand to Sit: with supervision PT Goal: Stand to Sit - Progress: Goal set today Pt will Ambulate: 16 - 50 feet;with supervision;with rolling walker PT Goal: Ambulate - Progress: Goal set today  Visit Information  Last PT Received On: 09/27/12 Assistance Needed: +1    Subjective Data  Subjective: i was hoping I wouldn't be in pain when you got here; I have had meds Patient Stated Goal: home   Prior Functioning  Home Living Lives With: Son Available Help at Discharge: Family;Available PRN/intermittently Type of Home: House Home Access: Stairs to enter Entrance Stairs-Number of Steps: 0 Home Layout: Two level;Able to live on main level with bedroom/bathroom Bathroom Shower/Tub: Walk-in shower Home Adaptive Equipment: Walker - rolling;Quad cane Prior Function Level of Independence: Independent with assistive device(s) Vocation: On disability    Cognition  Overall Cognitive Status: Appears within functional limits for tasks assessed/performed Arousal/Alertness: Awake/alert Orientation Level: Appears intact for tasks assessed Behavior During Session: [facility] for tasks performed    Extremity/Trunk Assessment Right  Upper Extremity Assessment RUE ROM/Strength/Tone: [facility] for tasks assessed Left Upper  Extremity Assessment LUE ROM/Strength/Tone: [facility] for tasks assessed Right Lower Extremity Assessment RLE ROM/Strength/Tone: Deficits;Unable to fully assess;Due to pain RLE ROM/Strength/Tone Deficits: quads 2/5; hip 1/5 due to pain; ankle WFL; able to bear ~25% wt on RLE in standing Left Lower Extremity Assessment LLE ROM/Strength/Tone: [facility] for tasks assessed   Balance    End of Session PT - End of Session Equipment Utilized During Treatment: Gait belt Activity Tolerance: Patient limited by pain;Patient limited by fatigue Patient left: in chair;with call bell/phone within reach;with family/visitor present Nurse Communication: Mobility status  GP     [facility] 09/27/2012, 2:03 PM

## 2012-09-27 NOTE — Progress Notes (Signed)
   Subjective: 1 Day Post-Op Procedure(s) (LRB): TIBIAL OSTEOTOMY (Right) INTRAMEDULLARY (IM) RETROGRADE FEMORAL NAILING (Right) Patient reports pain as mild.   Patient seen in rounds with Dr. Lequita Halt. Family member in room. Patient is well, and has had no acute complaints or problems We will start therapy today.  Plan is to go Home after hospital stay.  Objective: Vital signs in last 24 hours: Temp:  [97.6 F (36.4 C)-98.7 F (37.1 C)] 98.7 F (37.1 C) (01/17 0609) Pulse Rate:  [95-120] 107  (01/17 0609) Resp:  [13-20] 14  (01/17 0609) BP: (129-161)/(75-99) 135/83 mmHg (01/17 0609) SpO2:  [94 %-100 %] 96 % (01/17 0609) FiO2 (%):  [99 %] 99 % (01/16 1700) Weight:  [97.977 kg (216 lb)] 97.977 kg (216 lb) (01/16 1700)  Intake/Output from previous day:  Intake/Output Summary (Last 24 hours) at 09/27/12 0918 Last data filed at 09/27/12 0805  Gross per 24 hour  Intake 4538.75 ml  Output   1220 ml  Net 3318.75 ml    Intake/Output this shift: Total I/O In: 240 [P.O.:240] Out: -   Labs: CBC and BMET pending at time of note and rounds.  EXAM General - Patient is Alert, Appropriate and Oriented Extremity - Neurovascular intact Sensation intact distally Dorsiflexion/Plantar flexion intact Dressing - dressing C/D/I Motor Function - intact, moving foot and toes well on exam.   Past Medical History  Diagnosis Date  . Hypertension   . Hypothyroidism   . Arthritis     Assessment/Plan: 1 Day Post-Op Procedure(s) (LRB): TIBIAL OSTEOTOMY (Right) INTRAMEDULLARY (IM) RETROGRADE FEMORAL NAILING (Right) Principal Problem:  *OA (osteoarthritis) of knee  Estimated Body mass index is 43.63 kg/(m^2) as calculated from the following:   Height as of this encounter: 4\' 11" (1.499 m).   Weight as of this encounter: 216 lb(97.977 kg). Advance diet Up with therapy Plan for discharge tomorrow Discharge home with home health  DVT Prophylaxis - Lovenox Partial Weight-Bearing 50% to  right leg No vaccines. D/C O2 and Pulse OX and try on Room [address]  Patrica Duel 09/27/2012, 9:18 AM

## 2012-09-27 NOTE — Op Note (Signed)
NAMECLAUDELL, RHODY NO.:  [id number]  MEDICAL RECORD NO.:  [id number]  LOCATION:  1604                         FACILITY:  [facility]  PHYSICIAN:  Ollen Gross, M.D.    DATE OF BIRTH:  29-Apr-1966  DATE OF PROCEDURE:  09/26/2012 DATE OF DISCHARGE:                              OPERATIVE REPORT   PREOPERATIVE DIAGNOSES: 1. Right knee osteoarthritis with angular deformity, right femur. 2. Osteoarthritis, left knee.  PROCEDURES: 1. Right femoral corrective osteotomy with retrograde intramedullary     nail placement. 2. Left knee cortisone injection.  SURGEON:  Ollen Gross, M.D.  ASSISTANT:  Alexzandrew L. Perkins, PA-C.  ANESTHESIA:  General.  ESTIMATED BLOOD LOSS:  250 mL.  DRAINS:  None.  COMPLICATIONS:  None.  CONDITION:  Stable to recovery.  BRIEF CLINICAL NOTE:  Ms. Rebecca Miles is a 47 year old female who has severe end-stage arthritis of both knees.  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  This has led to severe malalignment of her legs.  She has had corrective osteotomies of her tibia in the past.  It was felt that prior to considering any total knee arthroplasty, she would need a corrective osteotomy of the angular deformity of her femur. She presents for that today.  In addition, she has severe osteoarthritis of her left knee and was requesting cortisone injection into the left knee today.  PROCEDURE IN DETAIL:  After successful administration of general anesthetic, the patient's right lower extremity was isolated from her perineum with plastic drapes and prepped and draped in usual sterile fashion.  Midline incision was made over the right knee.  Skin was cut with 10-blade for the subcutaneous tissue to the extensor mechanism. Medial arthrotomy was made to gain access to the distal femur.  Knee was held in a flexed position and then a guidepin for the [employer]  retrograde femoral nail was passed into the femoral canal.  The reamer was then passed over that guidepin under fluoroscopic guidance.  I then placed a ball-tipped guidewire into the distal femur up to the level of the angular deformity.  Under fluoro guidance, I then marked out level and then made a lateral incision on the thigh with a 10-blade for the subcutaneous tissue to the fascia lata, which was incised in line with the skin incision.  Fascia to vastus lateralis was incised and the muscle was split to get to the femur.  At the apex of the angular deformity, we placed retractors and confirmed this to be apex under fluoro guidance.  I then removed a wedge of the femur from lateral to medial wire, lateral than medial in order to close this down and correct the varus deformity.  We had made the osteotomy and removed the bone wedge.  I then placed the guidewire into the proximal fragment and reamed up to 13 mm for placement of an 11-mm nail.  We then removed that guidewire and then passed the guidewire from distal to proximal through the knee across the fracture site and into the proximal fragment.  We controlled her rotation and locked in an apposition.  The length of the  nail was 300 mm.  The 11 x 300 nail was then passed over the guidewire across the fracture site into the proximal fragment.  This had great rotational stability.  Through the external guide, we then placed two distal interlocks from lateral to medial.  I then removed the external guide.  Under freehand technique, I placed a proximal interlock through the nail, which was distal in nail with proximal in the femur.  This had excellent purchase.  We scanned the femur with C-arm in AP and lateral views and this showed excellent correction of the angular deformity both in the sagittal and the coronal plane.  The wounds were then copiously irrigated with saline solution.  Arthrotomy was closed with interrupted #1 Vicryl,  subcu with interrupted 2-0 Vicryl and subcuticular running 4- 0 Monocryl.  The lateral incision was closed with #1 Vicryl for the fascia lata, 2-0 Vicryl for the subcu and Monocryl for the subcuticular. The interlock incisions were closed with interrupted 4-0 nylon.  The incisions were cleaned and dried and bulky sterile dressing was applied. She was awakened and transported to recovery in stable condition.  Prior to this, I had also prepped the left knee and injected with 5 mL of Marcaine mixed with 80 mg of Depo-Medrol.  This was done without difficulty.  Please note that a surgical assistant was a medical necessity for the femoral osteotomy.  Assistant was necessary for protection of vital neurovascular structures and also for retraction to allow for the corrective osteotomy as well as for the alignment of the femur for placement of the rod.     Ollen Gross, M.D.     FA/MEDQ  D:  09/26/2012  T:  09/27/2012  Job:  [id number]

## 2012-09-27 NOTE — Progress Notes (Signed)
Utilization review completed.  

## 2012-09-28 LAB — CBC
MCH: 29.9 pg (ref 26.0–34.0)
MCHC: 33.7 g/dL (ref 30.0–36.0)
Platelets: 282 10*3/uL (ref 150–400)
RBC: 3.74 MIL/uL — ABNORMAL LOW (ref 3.87–5.11)

## 2012-09-28 LAB — BASIC METABOLIC PANEL
CO2: 27 mEq/L (ref 19–32)
Calcium: 8.8 mg/dL (ref 8.4–10.5)
GFR calc non Af Amer: >90 mL/min (ref >90)
Potassium: 4.2 mEq/L (ref 3.5–5.1)
Sodium: 137 mEq/L (ref 135–145)

## 2012-09-28 MED ORDER — ASPIRIN EC 325 MG PO TBEC: 325.0000 mg | DELAYED_RELEASE_TABLET | Freq: Every day | ORAL | Status: DC

## 2012-09-28 MED ORDER — OXYCODONE HCL 5 MG PO TABS: 5.0000 mg | ORAL_TABLET | ORAL | Status: DC | PRN

## 2012-09-28 MED ORDER — ENOXAPARIN SODIUM 40 MG/0.4ML ~~LOC~~ SOLN: 40.0000 mg | SUBCUTANEOUS | Status: DC

## 2012-09-28 MED ORDER — METHOCARBAMOL 500 MG PO TABS: 500.0000 mg | ORAL_TABLET | Freq: Four times a day (QID) | ORAL | Status: DC | PRN

## 2012-09-28 NOTE — Progress Notes (Signed)
Physical Therapy Treatment Patient Details Name: Rebecca Miles MRN: [id number] DOB: 01-01-1966 Today's Date: 09/28/2012 Time: 1000-1027 PT Time Calculation (min): 27 min  PT Assessment / Plan / Recommendation Comments on Treatment Session  slowly progressing with ambulation-fatigues easily. discussed possible need for wheelchair use due to limited distance. pt states she has wheelchair available at home that she can use. recommend hhpt and intermittent supervision/assist at home.     Follow Up Recommendations  Home health PT;Supervision - Intermittent     Does the patient have the potential to tolerate intense rehabilitation     Barriers to Discharge        Equipment Recommendations  None recommended by PT    Recommendations for Other Services    Frequency Min 6X/week   Plan Discharge plan remains appropriate    Precautions / Restrictions Precautions Precautions: None Restrictions Weight Bearing Restrictions: Yes RLE Weight Bearing: Partial weight bearing RLE Partial Weight Bearing Percentage or Pounds: 50%   Pertinent Vitals/Pain 4/10 R LE with activity    Mobility  Bed Mobility Bed Mobility: Supine to Sit Supine to Sit: 4: Min assist;HOB elevated Details for Bed Mobility Assistance: increased time, cues for technique  Transfers Transfers: Sit to Stand;Stand to Sit Sit to Stand: 4: Min assist;From bed;With upper extremity assist Stand to Sit: 4: Min assist;To chair/3-in-1;With armrests;With upper extremity assist Details for Transfer Assistance: Assist to rise, stabilize, control descent. VCs safety, technique, hand placement.  Ambulation/Gait Ambulation/Gait Assistance: 4: Min guard;Min assist Ambulation Distance (Feet): 12 Feet Assistive device: Rolling walker Ambulation/Gait Assistance Details: cues for sequence, Rw distance form self, posture, and PWB RLE  Gait Pattern: Trunk flexed;Step-to pattern;Decreased stance time - right;Decreased stride  length;Decreased step length - right;Decreased step length - left;Antalgic    Exercises     PT Diagnosis:    PT Problem List:   PT Treatment Interventions:     PT Goals Acute Rehab PT Goals Pt will go Supine/Side to Sit: with supervision PT Goal: Supine/Side to Sit - Progress: Progressing toward goal Pt will go Sit to Stand: with supervision PT Goal: Sit to Stand - Progress: Progressing toward goal Pt will go Stand to Sit: with supervision PT Goal: Stand to Sit - Progress: Progressing toward goal Pt will Ambulate: 16 - 50 feet;with supervision;with rolling walker PT Goal: Ambulate - Progress: Progressing toward goal  Visit Information  Last PT Received On: 09/28/12 Assistance Needed: +1    Subjective Data  Subjective: "It feel better today" Patient Stated Goal: Home   Cognition  Overall Cognitive Status: Appears within functional limits for tasks assessed/performed Arousal/Alertness: Awake/Miles Orientation Level: Appears intact for tasks assessed Behavior During Session: [facility] for tasks performed    Balance     End of Session PT - End of Session Equipment Utilized During Treatment: Gait belt Activity Tolerance: Patient limited by fatigue Patient left: in chair;with call bell/phone within reach;with family/visitor present   GP     Rebecca Miles [facility] 09/28/2012, 11:15 AM [phone]

## 2012-09-28 NOTE — Care Management (Signed)
[name] spoke with pt concerning dc planning with adult son in room. Per pt choice [facility] to provide Hh services upon discharge. Pt request [facility]. [facility] to deliver Dme prior to discharge.   Rebecca Miles [phone]

## 2012-09-28 NOTE — Progress Notes (Signed)
Subjective: 2 Days Post-Op Procedure(s) (LRB): TIBIAL OSTEOTOMY (Right) INTRAMEDULLARY (IM) RETROGRADE FEMORAL NAILING (Right) Patient reports pain as mild.   No c/o.  Did well with PT.  Objective: Vital signs in last 24 hours: Temp:  [98.4 F (36.9 C)-99.4 F (37.4 C)] 98.7 F (37.1 C) (01/18 0500) Pulse Rate:  [97-125] 97  (01/18 0500) Resp:  [14-16] 14  (01/18 0500) BP: (120-162)/(76-88) 120/76 mmHg (01/18 0500) SpO2:  [94 %-97 %] 97 % (01/18 0500)  Intake/Output from previous day: 01/17 0701 - 01/18 0700 In: 2256.1 [P.O.:960; I.V.:1046.1; IV Piggyback:250] Out: 4600 [Urine:4600] Intake/Output this shift: Total I/O In: 60 [I.V.:60] Out: -    Basename 09/28/12 0443 09/27/12 0905  HGB 11.2* 11.7*    Basename 09/28/12 0443 09/27/12 0905  WBC 19.6* 20.0*  RBC 3.74* 3.87  HCT 33.2* 33.9*  PLT 282 291    Basename 09/28/12 0443 09/27/12 0905  NA 137 137  K 4.2 3.8  CL 105 103  CO2 27 25  BUN 11 8  CREATININE 0.54 0.51  GLUCOSE 136* 159*  CALCIUM 8.8 9.0   No results found for this basename: LABPT:2,INR:2 in the last 72 hours  R LE wounds dressed and dry.  Assessment/Plan: 2 Days Post-Op Procedure(s) (LRB): TIBIAL OSTEOTOMY (Right) INTRAMEDULLARY (IM) RETROGRADE FEMORAL NAILING (Right) Discharge home with home health  Toni Arthurs 09/28/2012, 10:42 AM

## 2012-09-28 NOTE — Discharge Summary (Signed)
Physician Discharge Summary   Patient ID: Rebecca Miles MRN: [id number] DOB/AGE: 1966/08/07 47 y.o.  Admit date: 09/26/2012 Discharge date: 09/28/2012  Primary Diagnosis: 1. Right knee osteoarthritis with angular deformity, right femur.  2. Osteoarthritis, left knee.  Admission Diagnoses:  Past Medical History  Diagnosis Date  . Hypertension   . Hypothyroidism   . Arthritis    Discharge Diagnoses:   Principal Problem:  *OA (osteoarthritis) of knee  Estimated Body mass index is 43.63 kg/(m^2) as calculated from the following:   Height as of this encounter: 4\' 11" (1.499 m).   Weight as of this encounter: 216 lb(97.977 kg).  Classification of overweight in adults according to BMI (WHO, 1998)   Procedure: Procedure(s) (LRB): TIBIAL OSTEOTOMY (Right) INTRAMEDULLARY (IM) RETROGRADE FEMORAL NAILING (Right)   Consults: None  HPI: Rebecca Miles is a 47 year old female who has  severe end-stage arthritis of both knees. 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 This has led to severe malalignment of her  legs. She has had corrective osteotomies of her tibia in the past. It  was felt that prior to considering any total knee arthroplasty, she  would need a corrective osteotomy of the angular deformity of her femur.  She presents for that today. In addition, she has severe osteoarthritis  of her left knee and was requesting cortisone injection into the left  knee today.  Laboratory Data: Admission on 09/26/2012  Component Date Value Range Status  . ABO/RH(D) 09/26/2012 A POS   Final  . Antibody Screen 09/26/2012 NEG   Final  . Sample Expiration 09/26/2012 09/29/2012   Final  . Preg Test, Ur 09/26/2012 NEGATIVE  NEGATIVE Final   Comment:                                 THE SENSITIVITY OF THIS                          METHODOLOGY IS >24 mIU/mL  . ABO/RH(D) 09/26/2012 A POS   Final  . WBC  09/27/2012 20.0* 4.0 - 10.5 K/uL Final  . RBC 09/27/2012 3.87  3.87 - 5.11 MIL/uL Final  . Hemoglobin 09/27/2012 11.7* 12.0 - 15.0 g/dL Final  . HCT 78/29/5621 33.9* 36.0 - 46.0 % Final  . MCV 09/27/2012 87.6  78.0 - 100.0 fL Final  . MCH 09/27/2012 30.2  26.0 - 34.0 pg Final  . MCHC 09/27/2012 34.5  30.0 - 36.0 g/dL Final  . RDW 30/86/5784 12.8  11.5 - 15.5 % Final  . Platelets 09/27/2012 291  150 - 400 K/uL Final  . Sodium 09/27/2012 137  135 - 145 mEq/L Final  . Potassium 09/27/2012 3.8  3.5 - 5.1 mEq/L Final  . Chloride 09/27/2012 103  96 - 112 mEq/L Final  . CO2 09/27/2012 25  19 - 32 mEq/L Final  . Glucose, Bld 09/27/2012 159* 70 - 99 mg/dL Final  . BUN 69/62/9528 8  6 - 23 mg/dL Final  . Creatinine, Ser 09/27/2012 0.51  0.50 - 1.10 mg/dL Final  . Calcium 41/32/4401 9.0  8.4 - 10.5 mg/dL Final  . GFR calc non Af Amer 09/27/2012 >90  >90 mL/min Final  . GFR calc Af Amer 09/27/2012 >90  >90 mL/min Final   Comment:  The eGFR has been calculated                          using the CKD EPI equation.                          This calculation has not been                          validated in all clinical                          situations.                          eGFR's persistently                          <90 mL/min signify                          possible Chronic Kidney Disease.  . WBC 09/28/2012 19.6* 4.0 - 10.5 K/uL Final  . RBC 09/28/2012 3.74* 3.87 - 5.11 MIL/uL Final  . Hemoglobin 09/28/2012 11.2* 12.0 - 15.0 g/dL Final  . HCT 16/06/9603 33.2* 36.0 - 46.0 % Final  . MCV 09/28/2012 88.8  78.0 - 100.0 fL Final  . MCH 09/28/2012 29.9  26.0 - 34.0 pg Final  . MCHC 09/28/2012 33.7  30.0 - 36.0 g/dL Final  . RDW 54/05/8118 13.3  11.5 - 15.5 % Final  . Platelets 09/28/2012 282  150 - 400 K/uL Final  . Sodium 09/28/2012 137  135 - 145 mEq/L Final  . Potassium 09/28/2012 4.2  3.5 - 5.1 mEq/L Final  . Chloride 09/28/2012 105  96 - 112 mEq/L Final    . CO2 09/28/2012 27  19 - 32 mEq/L Final  . Glucose, Bld 09/28/2012 136* 70 - 99 mg/dL Final  . BUN 14/78/2956 11  6 - 23 mg/dL Final  . Creatinine, Ser 09/28/2012 0.54  0.50 - 1.10 mg/dL Final  . Calcium 21/30/8657 8.8  8.4 - 10.5 mg/dL Final  . GFR calc non Af Amer 09/28/2012 >90  >90 mL/min Final  . GFR calc Af Amer 09/28/2012 >90  >90 mL/min Final   Comment:                                 The eGFR has been calculated                          using the CKD EPI equation.                          This calculation has not been                          validated in all clinical                          situations.                          eGFR's persistently                          <  90 mL/min signify                          possible Chronic Kidney Disease.  Hospital Outpatient Visit on 09/17/2012  Component Date Value Range Status  . aPTT 09/17/2012 33  24 - 37 seconds Final  . WBC 09/17/2012 8.0  4.0 - 10.5 K/uL Final  . RBC 09/17/2012 4.74  3.87 - 5.11 MIL/uL Final  . Hemoglobin 09/17/2012 14.0  12.0 - 15.0 g/dL Final  . HCT 47/82/9562 40.9  36.0 - 46.0 % Final  . MCV 09/17/2012 86.3  78.0 - 100.0 fL Final  . MCH 09/17/2012 29.5  26.0 - 34.0 pg Final  . MCHC 09/17/2012 34.2  30.0 - 36.0 g/dL Final  . RDW 13/04/6577 12.8  11.5 - 15.5 % Final  . Platelets 09/17/2012 434* 150 - 400 K/uL Final  . Sodium 09/17/2012 136  135 - 145 mEq/L Final  . Potassium 09/17/2012 3.5  3.5 - 5.1 mEq/L Final  . Chloride 09/17/2012 100  96 - 112 mEq/L Final  . CO2 09/17/2012 28  19 - 32 mEq/L Final  . Glucose, Bld 09/17/2012 87  70 - 99 mg/dL Final  . BUN 46/96/2952 7  6 - 23 mg/dL Final  . Creatinine, Ser 09/17/2012 0.62  0.50 - 1.10 mg/dL Final  . Calcium 84/13/2440 9.6  8.4 - 10.5 mg/dL Final  . Total Protein 09/17/2012 7.3  6.0 - 8.3 g/dL Final  . Albumin 07/08/2535 3.8  3.5 - 5.2 g/dL Final  . AST 64/40/3474 16  0 - 37 U/L Final  . ALT 09/17/2012 15  0 - 35 U/L Final  . Alkaline  Phosphatase 09/17/2012 83  39 - 117 U/L Final  . Total Bilirubin 09/17/2012 0.5  0.3 - 1.2 mg/dL Final  . GFR calc non Af Amer 09/17/2012 >90  >90 mL/min Final  . GFR calc Af Amer 09/17/2012 >90  >90 mL/min Final   Comment:                                 The eGFR has been calculated                          using the CKD EPI equation.                          This calculation has not been                          validated in all clinical                          situations.                          eGFR's persistently                          <90 mL/min signify                          possible Chronic Kidney Disease.  Marland Kitchen Prothrombin Time 09/17/2012 13.5  11.6 - 15.2 seconds Final  . INR 09/17/2012 1.04  0.00 - 1.49 Final  .  Color, Urine 09/17/2012 YELLOW  YELLOW Final  . APPearance 09/17/2012 CLEAR  CLEAR Final  . Specific Gravity, Urine 09/17/2012 1.011  1.005 - 1.030 Final  . pH 09/17/2012 6.0  5.0 - 8.0 Final  . Glucose, UA 09/17/2012 NEGATIVE  NEGATIVE mg/dL Final  . Hgb urine dipstick 09/17/2012 SMALL* NEGATIVE Final  . Bilirubin Urine 09/17/2012 NEGATIVE  NEGATIVE Final  . Ketones, ur 09/17/2012 NEGATIVE  NEGATIVE mg/dL Final  . Protein, ur 29/56/2130 NEGATIVE  NEGATIVE mg/dL Final  . Urobilinogen, UA 09/17/2012 0.2  0.0 - 1.0 mg/dL Final  . Nitrite 86/57/8469 NEGATIVE  NEGATIVE Final  . Leukocytes, UA 09/17/2012 NEGATIVE  NEGATIVE Final  . MRSA, PCR 09/17/2012 NEGATIVE  NEGATIVE Final  . Staphylococcus aureus 09/17/2012 NEGATIVE  NEGATIVE Final   Comment:                                 The Xpert SA Assay (FDA                          approved for NASAL specimens                          in patients over 77 years of age),                          is one component of                          a comprehensive surveillance                          program.  Test performance has                          been validated by [employer] for patients  greater                          than or equal to 57 year old.                          It is not intended                          to diagnose infection nor to                          guide or monitor treatment.  . Squamous Epithelial / LPF 09/17/2012 FEW* RARE Final  . RBC / HPF 09/17/2012 0-2  <3 RBC/hpf Final  . Bacteria, UA 09/17/2012 FEW* RARE Final     X-Rays:Dg Chest 2 View  09/17/2012  *RADIOLOGY REPORT*  Clinical Data: Preoperative right femoral surgery; hypertension  CHEST - 2 VIEW  Comparison: None.  Findings: Lungs clear.  Heart size and pulmonary vascularity are normal.  No adenopathy.  There is thoracolumbar levoscoliosis.  IMPRESSION: No edema or consolidation.   Original Report Authenticated By: Bretta Bang, M.D.    Dg Femur Right  09/26/2012  *  RADIOLOGY REPORT*  Clinical Data: Right femur injury  DG C-ARM 1-60 MIN - NRPT [facility],RIGHT FEMUR - 2 VIEW  Comparison: None.  Findings: C-arm films document placement of an intramedullary nail across a mid femur fracture.  Improved position and alignment.  IMPRESSION: As above.   Original Report Authenticated By: Davonna Belling, M.D.    Dg C-arm 1-60 Min-no Report  09/26/2012  *RADIOLOGY REPORT*  Clinical Data: Right femur injury  DG C-ARM 1-60 MIN - NRPT [facility],RIGHT FEMUR - 2 VIEW  Comparison: None.  Findings: C-arm films document placement of an intramedullary nail across a mid femur fracture.  Improved position and alignment.  IMPRESSION: As above.   Original Report Authenticated By: Davonna Belling, M.D.     EKG: Orders placed during the hospital encounter of 09/17/12  . EKG 12-LEAD  . EKG 12-LEAD     Hospital Course: Patient was admitted to [facility] and taken to the OR and underwent the above state procedure without complications.  Patient tolerated the procedure well and was later transferred to the recovery room and then to the orthopaedic floor for postoperative care.  They were given PO and IV analgesics for pain  control following their surgery.  They were given 24 hours of postoperative antibiotics of  Anti-infectives     Start     Dose/Rate Route Frequency Ordered Stop   09/26/12 2000   ceFAZolin (ANCEF) IVPB 2 g/50 mL premix        2 g 100 mL/hr over 30 Minutes Intravenous Every 6 hours 09/26/12 1659 09/27/12 0920   09/26/12 0944   ceFAZolin (ANCEF) IVPB 2 g/50 mL premix        2 g 100 mL/hr over 30 Minutes Intravenous 60 min pre-op 09/26/12 0944 09/26/12 1318         and started on DVT prophylaxis in the form of Lovenox.   PT and OT were ordered for total hip protocol.  The patient was allowed to be WBAT with therapy. Discharge planning was consulted to help with postop disposition and equipment needs.  Patient had a good night on the evening of surgery and started to get up OOB with therapy on day one walking about 6 feet.  Continued to work with therapy into day two.  Dressing was changed on day two and the incision was healing well.   Patient was seen in rounds by the weekend coverage staff and was ready to go home.   Discharge Medications: Prior to Admission medications   Medication Sig Start Date End Date Taking? Authorizing Provider  aspirin EC 325 MG tablet Take 1 tablet (325 mg total) by mouth daily. 09/28/12   Toni Arthurs, MD  enoxaparin (LOVENOX) 40 MG/0.4ML injection Inject 0.4 mLs (40 mg total) into the skin daily. 09/28/12   Toni Arthurs, MD  methocarbamol (ROBAXIN) 500 MG tablet Take 1 tablet (500 mg total) by mouth every 6 (six) hours as needed. 09/28/12   Toni Arthurs, MD  Multiple Vitamin (MULTIVITAMIN WITH MINERALS) TABS Take 1 tablet by mouth daily.   Yes Historical Provider, MD  oxyCODONE (OXY IR/ROXICODONE) 5 MG immediate release tablet Take 1-2 tablets (5-10 mg total) by mouth every 3 (three) hours as needed for pain. 09/28/12   Toni Arthurs, MD  thyroid (ARMOUR) 120 MG tablet Take 120 mg by mouth daily before breakfast.   Yes Historical Provider, MD  valsartan-hydrochlorothiazide  (DIOVAN-HCT) 160-12.5 MG per tablet Take 1 tablet by mouth daily before breakfast.   Yes Historical Provider,  MD    Diet: Cardiac diet Activity: Partial Weight-Bearing 50% to right leg Follow-up:in 2 weeks Disposition - Home Discharged Condition: good   Discharge Orders    Future Orders Please Complete By Expires   Diet - low sodium heart healthy      Diet - low sodium heart healthy      Call MD / Call 911      Comments:   If you experience chest pain or shortness of breath, CALL 911 and be transported to the hospital emergency room.  If you develope a fever above 101 F, pus (white drainage) or increased drainage or redness at the wound, or calf pain, call your surgeon's office.   Constipation Prevention      Comments:   Drink plenty of fluids.  Prune juice may be helpful.  You may use a stool softener, such as Colace (over the counter) 100 mg twice a day.  Use MiraLax (over the counter) for constipation as needed.   Increase activity slowly as tolerated      Call MD / Call 911      Comments:   If you experience chest pain or shortness of breath, CALL 911 and be transported to the hospital emergency room.  If you develope a fever above 101 F, pus (white drainage) or increased drainage or redness at the wound, or calf pain, call your surgeon's office.   Discharge instructions      Comments:   Pick up stool softner and laxative for home. Do not submerge incision under water. May shower. Continue to use ice for pain and swelling from surgery.  Take Lovenox for 7 more days, then take and Aspirin 325 mg daily   Constipation Prevention      Comments:   Drink plenty of fluids.  Prune juice may be helpful.  You may use a stool softener, such as Colace (over the counter) 100 mg twice a day.  Use MiraLax (over the counter) for constipation as needed.   Increase activity slowly as tolerated      Patient may shower      Comments:   You may shower without a dressing once there is no  drainage.  Do not wash over the wound.  If drainage remains, do not shower until drainage stops.   Driving restrictions      Comments:   No driving until released by the physician.   Lifting restrictions      Comments:   No lifting until released by the physician.   TED hose      Comments:   Use stockings (TED hose) for 3 weeks on both leg(s).  You may remove them at night for sleeping.   Change dressing      Comments:   Change dressing daily with sterile 4 x 4 inch gauze dressing and apply TED hose. Do not submerge the incision under water.   Do not sit on low chairs, stoools or toilet seats, as it may be difficult to get up from low surfaces      Partial weight bearing      Scheduling Instructions:   50% weight bearing only. Do not advance.       Medication List     As of 09/28/2012 10:54 AM    STOP taking these medications         acetaminophen 650 MG CR tablet   Commonly known as: TYLENOL      ibuprofen 800 MG tablet   Commonly known  as: ADVIL,MOTRIN      TAKE these medications         aspirin EC 325 MG tablet   Take 1 tablet (325 mg total) by mouth daily.      enoxaparin 40 MG/0.4ML injection   Commonly known as: LOVENOX   Inject 0.4 mLs (40 mg total) into the skin daily.      methocarbamol 500 MG tablet   Commonly known as: ROBAXIN   Take 1 tablet (500 mg total) by mouth every 6 (six) hours as needed.      multivitamin with minerals Tabs   Take 1 tablet by mouth daily.      oxyCODONE 5 MG immediate release tablet   Commonly known as: Oxy IR/ROXICODONE   Take 1-2 tablets (5-10 mg total) by mouth every 3 (three) hours as needed for pain.      thyroid 120 MG tablet   Commonly known as: ARMOUR   Take 120 mg by mouth daily before breakfast.      valsartan-hydrochlorothiazide 160-12.5 MG per tablet   Commonly known as: DIOVAN-HCT   Take 1 tablet by mouth daily before breakfast.           Follow-up Information    Follow up with Loanne Drilling, MD.  Schedule an appointment as soon as possible for a visit in 2 weeks.   Contact information:   [address] [place] [address] [phone]          Signed: Patrica Duel 09/28/2012, 10:54 AM

## 2012-09-28 NOTE — Progress Notes (Signed)
Discharged from floor via w/c, family with pt. No changes in assessment. Eustace Hur  

## 2012-09-28 NOTE — Plan of Care (Signed)
Problem: Discharge Progression Outcomes Goal: Tubes and drains discontinued if indicated Outcome: Completed/Met Date Met:  09/28/12 Iv only     

## 2013-10-06 ENCOUNTER — Other Ambulatory Visit: Payer: Self-pay | Admitting: Orthopedic Surgery

## 2013-10-06 DIAGNOSIS — IMO0001 Reserved for inherently not codable concepts without codable children: Secondary | ICD-10-CM

## 2013-10-17 ENCOUNTER — Ambulatory Visit
Admission: RE | Admit: 2013-10-17 | Discharge: 2013-10-17 | Disposition: A | Discharge status: Home / Self Care | Payer: 59 | Source: Ambulatory Visit | Attending: Orthopedic Surgery | Admitting: Orthopedic Surgery

## 2013-10-17 DIAGNOSIS — IMO0001 Reserved for inherently not codable concepts without codable children: Secondary | ICD-10-CM

## 2015-06-24 DIAGNOSIS — Z23 Encounter for immunization: Secondary | ICD-10-CM | POA: Diagnosis not present

## 2015-06-24 DIAGNOSIS — Q6680 Congenital vertical talus deformity, unspecified foot: Secondary | ICD-10-CM | POA: Diagnosis not present

## 2015-06-24 DIAGNOSIS — R7309 Other abnormal glucose: Secondary | ICD-10-CM | POA: Diagnosis not present

## 2015-06-24 DIAGNOSIS — I10 Essential (primary) hypertension: Secondary | ICD-10-CM | POA: Diagnosis not present

## 2015-08-18 DIAGNOSIS — M1711 Unilateral primary osteoarthritis, right knee: Secondary | ICD-10-CM | POA: Diagnosis not present

## 2015-08-18 DIAGNOSIS — M1712 Unilateral primary osteoarthritis, left knee: Secondary | ICD-10-CM | POA: Diagnosis not present

## 2015-08-18 DIAGNOSIS — M170 Bilateral primary osteoarthritis of knee: Secondary | ICD-10-CM | POA: Diagnosis not present

## 2015-11-15 DIAGNOSIS — R7309 Other abnormal glucose: Secondary | ICD-10-CM | POA: Diagnosis not present

## 2015-11-15 DIAGNOSIS — I10 Essential (primary) hypertension: Secondary | ICD-10-CM | POA: Diagnosis not present

## 2015-11-15 DIAGNOSIS — Q6680 Congenital vertical talus deformity, unspecified foot: Secondary | ICD-10-CM | POA: Diagnosis not present

## 2015-11-15 DIAGNOSIS — H02732 Vitiligo of right lower eyelid and periocular area: Secondary | ICD-10-CM | POA: Diagnosis not present

## 2015-11-15 DIAGNOSIS — E038 Other specified hypothyroidism: Secondary | ICD-10-CM | POA: Diagnosis not present

## 2015-12-08 DIAGNOSIS — Q6680 Congenital vertical talus deformity, unspecified foot: Secondary | ICD-10-CM | POA: Diagnosis not present

## 2015-12-08 DIAGNOSIS — Z6841 Body Mass Index (BMI) 40.0 and over, adult: Secondary | ICD-10-CM | POA: Diagnosis not present

## 2015-12-08 DIAGNOSIS — E038 Other specified hypothyroidism: Secondary | ICD-10-CM | POA: Diagnosis not present

## 2015-12-08 DIAGNOSIS — N3281 Overactive bladder: Secondary | ICD-10-CM | POA: Diagnosis not present

## 2015-12-08 DIAGNOSIS — H02732 Vitiligo of right lower eyelid and periocular area: Secondary | ICD-10-CM | POA: Diagnosis not present

## 2015-12-08 DIAGNOSIS — I10 Essential (primary) hypertension: Secondary | ICD-10-CM | POA: Diagnosis not present

## 2015-12-08 DIAGNOSIS — R7309 Other abnormal glucose: Secondary | ICD-10-CM | POA: Diagnosis not present

## 2015-12-30 DIAGNOSIS — M1712 Unilateral primary osteoarthritis, left knee: Secondary | ICD-10-CM | POA: Diagnosis not present

## 2015-12-30 DIAGNOSIS — M170 Bilateral primary osteoarthritis of knee: Secondary | ICD-10-CM | POA: Diagnosis not present

## 2015-12-30 DIAGNOSIS — M1711 Unilateral primary osteoarthritis, right knee: Secondary | ICD-10-CM | POA: Diagnosis not present

## 2016-09-26 DIAGNOSIS — Z23 Encounter for immunization: Secondary | ICD-10-CM | POA: Diagnosis not present

## 2017-01-12 LAB — BASIC METABOLIC PANEL: GLUCOSE: 122 mg/dL

## 2017-02-16 DIAGNOSIS — M1712 Unilateral primary osteoarthritis, left knee: Secondary | ICD-10-CM | POA: Diagnosis not present

## 2017-02-16 DIAGNOSIS — M170 Bilateral primary osteoarthritis of knee: Secondary | ICD-10-CM | POA: Diagnosis not present

## 2017-02-16 DIAGNOSIS — M1711 Unilateral primary osteoarthritis, right knee: Secondary | ICD-10-CM | POA: Diagnosis not present

## 2017-05-21 ENCOUNTER — Encounter: Payer: Self-pay | Admitting: Family Medicine

## 2017-05-21 ENCOUNTER — Ambulatory Visit (INDEPENDENT_AMBULATORY_CARE_PROVIDER_SITE_OTHER): Payer: BLUE CROSS/BLUE SHIELD | Admitting: Family Medicine

## 2017-05-21 VITALS — BP 140/82 | HR 68 | Temp 97.6°F | Ht 59.0 in | Wt 227.4 lb

## 2017-05-21 DIAGNOSIS — E039 Hypothyroidism, unspecified: Secondary | ICD-10-CM | POA: Diagnosis not present

## 2017-05-21 DIAGNOSIS — Z8639 Personal history of other endocrine, nutritional and metabolic disease: Secondary | ICD-10-CM

## 2017-05-21 DIAGNOSIS — R262 Difficulty in walking, not elsewhere classified: Secondary | ICD-10-CM

## 2017-05-21 DIAGNOSIS — I10 Essential (primary) hypertension: Secondary | ICD-10-CM | POA: Diagnosis not present

## 2017-05-21 DIAGNOSIS — R7303 Prediabetes: Secondary | ICD-10-CM

## 2017-05-21 MED ORDER — LEVOTHYROXINE SODIUM 150 MCG PO TABS: 150.0000 ug | ORAL_TABLET | Freq: Every day | ORAL | 3 refills | Status: DC

## 2017-05-21 MED ORDER — OLMESARTAN MEDOXOMIL-HCTZ 20-12.5 MG PO TABS: 1.0000 | ORAL_TABLET | Freq: Every day | ORAL | 2 refills | Status: DC

## 2017-05-21 NOTE — Progress Notes (Signed)
BP (!) 147/92   Pulse 68   Temp 97.6 F (36.4 C) (Oral)   Ht 4\' 11"  (1.499 m)   Wt 227 lb 6 oz (103.1 kg)   BMI 45.92 kg/m    Subjective:    Patient ID: Rebecca Miles, female    DOB: [birth year], 51 y.o.   MRN: [id number]  HPI: Rebecca Miles is a 51 y.o. female presenting on 05/21/2017 for Establish Care (not fasting, BP has been elevated, off of thyroid medication)   HPI Hypertension Patient is coming in as a new patient to our office today to establish care. Patient is currently on valsartan/hydrochlorothiazide that she has been out of it for at least a month, and their blood pressure today is 147/92. Patient denies any lightheadedness or dizziness. Patient denies headaches, blurred vision, chest pains, shortness of breath, or weakness. Denies any side effects from medication and is content with current medication.   Hypothyroidism recheck Patient is coming in for thyroid recheck today as well. They deny any issues with hair changes or heat or cold problems or diarrhea or constipation. They deny any chest pain or palpitations. They are currently on levothyroxine 141micrograms she thinks that she is also been out of it for some time and can't remember what the last dose was. Calling her pharmacy we could not get a good answer what her last levothyroxine dose was either   Prediabetes Patient comes in today for recheck of his diabetes. Patient has been currently taking diet control, is not currently on medication. Patient is currently on an ACE inhibitor/ARB. Patient has not seen an ophthalmologist this year. Patient denies any issues with their feet.   Patient has a history of rickets and deformity of legs and difficulty walking because of this. She does have a current orthopedic who she sees for this but would like to get a scooter for prolonged trips when she cannot ambulate for those long distances.  Relevant past medical, surgical, family and social history reviewed and  updated as indicated. Interim medical history since our last visit reviewed. Allergies and medications reviewed and updated.  Review of Systems  Constitutional: Negative for chills and fever.  HENT: Negative for congestion, ear discharge, ear pain and tinnitus.   Eyes: Negative for pain, redness and visual disturbance.  Respiratory: Negative for cough, chest tightness, shortness of breath and wheezing.   Cardiovascular: Negative for chest pain, palpitations and leg swelling.  Gastrointestinal: Negative for abdominal pain, blood in stool, constipation and diarrhea.  Genitourinary: Negative for difficulty urinating, dysuria and hematuria.  Musculoskeletal: Positive for arthralgias. Negative for back pain, gait problem and myalgias.  Skin: Negative for rash.  Neurological: Negative for dizziness, weakness, light-headedness and headaches.  Psychiatric/Behavioral: Negative for agitation, behavioral problems and suicidal ideas.  All other systems reviewed and are negative.   Per HPI unless specifically indicated above  Social History   Social History  . Marital status: Married    Spouse name: N/A  . Number of children: N/A  . Years of education: N/A   Occupational History  . Not on file.   Social History Main Topics  . Smoking status: Former Smoker    Packs/day: 0.50    Years: 20.00    Quit date: 09/12/1999  . Smokeless tobacco: Never Used  . Alcohol use No     Comment: occasional  . Drug use: No  . Sexual activity: Not on file   Other Topics Concern  . Not on file  Social History Narrative  . No narrative on file    Past Surgical History:  Procedure Laterality Date  . CESAREAN SECTION    . FEMUR IM NAIL  09/26/2012   Procedure: INTRAMEDULLARY (IM) RETROGRADE FEMORAL NAILING;  Surgeon: Gearlean Alf, MD;  Location: [facility];  Service: Orthopedics;  Laterality: Right;  . LEG SURGERY     bilateral at age 55   . TIBIA OSTEOTOMY  09/26/2012   Procedure: TIBIAL OSTEOTOMY;   Surgeon: Gearlean Alf, MD;  Location: [facility];  Service: Orthopedics;  Laterality: Right;  RIGHT FEMORAL CORRECTIVE OSTEOMITY WITH RETROGRADE IM FEMORAL NAIL  . TUBAL LIGATION    . TUBAL LIGATION      Family History  Problem Relation Age of Onset  . Diabetes Mother   . Hypertension Mother   . Kidney disease Mother   . Lupus Sister   . Drug abuse Brother   . Early death Brother   . Stroke Brother   . Arthritis Maternal Grandmother   . Hypertension Maternal Grandmother   . Kidney disease Maternal Grandmother     Allergies as of 05/21/2017   No Known Allergies     Medication List       Accurate as of 05/21/17  9:51 AM. Always use your most recent med list.          GERITOL COMPLETE PO Take 1 tablet by mouth daily.   olmesartan-hydrochlorothiazide 20-12.5 MG tablet Commonly known as:  BENICAR HCT Take 1 tablet by mouth daily.   phosphorus 155-852-130 MG tablet Commonly known as:  K PHOS NEUTRAL Take by mouth 2 (two) times daily.   SYNTHROID 150 MCG tablet Generic drug:  levothyroxine Take 150 mcg by mouth daily before breakfast.   traMADol 50 MG tablet Commonly known as:  ULTRAM Take by mouth every 6 (six) hours as needed.            Discharge Care Instructions        Start     Ordered   05/21/17 0000  olmesartan-hydrochlorothiazide (BENICAR HCT) 20-12.5 MG tablet  Daily     05/21/17 0949         Objective:    BP (!) 147/92   Pulse 68   Temp 97.6 F (36.4 C) (Oral)   Ht 4\' 11"  (1.499 m)   Wt 227 lb 6 oz (103.1 kg)   BMI 45.92 kg/m   Wt Readings from Last 3 Encounters:  05/21/17 227 lb 6 oz (103.1 kg)  09/26/12 216 lb (98 kg)  09/17/12 216 lb 4.8 oz (98.1 kg)    Physical Exam  Constitutional: She is oriented to person, place, and time. She appears well-developed and well-nourished. No distress.  HENT:  Mouth/Throat: Oropharynx is clear and moist.  Eyes: Conjunctivae are normal.  Neck: Neck supple. No thyromegaly present.    Cardiovascular: Normal rate, regular rhythm, normal heart sounds and intact distal pulses.   No murmur heard. Pulmonary/Chest: Effort normal and breath sounds normal. No respiratory distress. She has no wheezes. She has no rales.  Abdominal: Soft. Bowel sounds are normal. She exhibits no distension. There is no tenderness. There is no rebound.  Musculoskeletal: Normal range of motion. She exhibits tenderness (Tenderness in the knees bilaterally) and deformity ([place] of lower legs because of rickets). She exhibits no edema.  Lymphadenopathy:    She has no cervical adenopathy.  Neurological: She is alert and oriented to person, place, and time. Coordination normal.  Skin: Skin is warm and  dry. No rash noted. She is not diaphoretic.  Psychiatric: She has a normal mood and affect. Her behavior is normal. Thought content normal.  Nursing note and vitals reviewed.   Results for orders placed or performed in visit on 82/95/62  Basic metabolic panel  Result Value Ref Range   Glucose 122 mg/dL      Assessment & Plan:   Problem List Items Addressed This Visit      Cardiovascular and Mediastinum   Hypertension   Relevant Medications   olmesartan-hydrochlorothiazide (BENICAR HCT) 20-12.5 MG tablet     Endocrine   Hypothyroidism - Primary   Relevant Medications   levothyroxine (SYNTHROID) 150 MCG tablet     Other   Prediabetes    Other Visit Diagnoses    History of rickets       Relevant Orders   DME Wheelchair electric   Difficulty walking       Relevant Orders   DME Wheelchair electric       Follow up plan: Return in about 4 weeks (around 06/18/2017), or if symptoms worsen or fail to improve, for thyroid and fasting labs.  Caryl Pina, MD [facility] 05/21/2017, 9:51 AM

## 2017-06-07 ENCOUNTER — Ambulatory Visit (INDEPENDENT_AMBULATORY_CARE_PROVIDER_SITE_OTHER): Payer: Medicare Other | Admitting: Family Medicine

## 2017-06-07 ENCOUNTER — Encounter: Payer: Self-pay | Admitting: Family Medicine

## 2017-06-07 VITALS — BP 155/92 | HR 87 | Temp 98.6°F | Ht 59.0 in | Wt 227.0 lb

## 2017-06-07 DIAGNOSIS — Z8639 Personal history of other endocrine, nutritional and metabolic disease: Secondary | ICD-10-CM | POA: Diagnosis not present

## 2017-06-07 DIAGNOSIS — R296 Repeated falls: Secondary | ICD-10-CM

## 2017-06-07 DIAGNOSIS — R2681 Unsteadiness on feet: Secondary | ICD-10-CM

## 2017-06-07 DIAGNOSIS — M174 Other bilateral secondary osteoarthritis of knee: Secondary | ICD-10-CM | POA: Diagnosis not present

## 2017-06-07 MED ORDER — OLMESARTAN MEDOXOMIL 20 MG PO TABS: 20.0000 mg | ORAL_TABLET | Freq: Every day | ORAL | 1 refills | Status: DC

## 2017-06-07 MED ORDER — HYDROCHLOROTHIAZIDE 12.5 MG PO TABS: 12.5000 mg | ORAL_TABLET | Freq: Every day | ORAL | 1 refills | Status: DC

## 2017-06-07 NOTE — Progress Notes (Signed)
BP (!) 155/92   Pulse 87   Temp 98.6 F (37 C) (Oral)   Ht 4\' 11"  (1.499 m)   Wt 227 lb (103 kg)   BMI 45.85 kg/m    Subjective:    Patient ID: Rebecca Miles, female    DOB: [birth year], 51 y.o.   MRN: [id number]  HPI: Rebecca Miles is a 51 y.o. female presenting on 06/07/2017 for Mobility evaluation and Hypertension (causing very frequent urination, unable to get to restroom at times; Benicar HCT seems to be causing her to have edema; would rather have fluid pill separate so that she could take only when she notices edema or SOB)   HPI Gait issues and mobility and needs prescription for motorized wheel chair Patient has chronic gait issues due to rickets when she was a child that left her very bowlegged and has since led to arthritis significantly both her hips and her knees. She has difficulty with mobility and has been using a cane but despite the cane she does have recurrent falls in her own home and when she goes out. Her gait is so unsteady that the cane walker just not working well for her. She does not feel like she could manage a manual wheelchair because her upper body strength and decreased endurance. She has difficulties walking even across short rooms without being unsteady or possibly falling even using her cane. Patient's home is not optimally arranged for a scooter because of the lack of space and inability to make full turns with a scooter. Unsteady gait and recurrent falls is something that is affecting her daily living and ability to move from room to room and cook and clean  Relevant past medical, surgical, family and social history reviewed and updated as indicated. Interim medical history since our last visit reviewed. Allergies and medications reviewed and updated.  Review of Systems  Constitutional: Negative for chills and fever.  Respiratory: Negative for chest tightness and shortness of breath.   Cardiovascular: Negative for chest pain and leg swelling.    Musculoskeletal: Positive for arthralgias, gait problem and myalgias. Negative for back pain.  Skin: Negative for rash.  Neurological: Positive for weakness. Negative for dizziness, light-headedness, numbness and headaches.  Psychiatric/Behavioral: Negative for agitation and behavioral problems.  All other systems reviewed and are negative.   Per HPI unless specifically indicated above        Objective:    BP (!) 155/92   Pulse 87   Temp 98.6 F (37 C) (Oral)   Ht 4\' 11"  (1.499 m)   Wt 227 lb (103 kg)   BMI 45.85 kg/m   Wt Readings from Last 3 Encounters:  06/07/17 227 lb (103 kg)  05/21/17 227 lb 6 oz (103.1 kg)  09/26/12 216 lb (98 kg)    Physical Exam  Constitutional: She is oriented to person, place, and time. She appears well-developed and well-nourished. No distress.  Eyes: Conjunctivae are normal.  Cardiovascular: Normal rate, regular rhythm, normal heart sounds and intact distal pulses.   No murmur heard. Pulmonary/Chest: Effort normal and breath sounds normal. No respiratory distress. She has no wheezes. She has no rales.  Musculoskeletal: She exhibits tenderness. She exhibits no edema.       Right knee: She exhibits decreased range of motion (mildly decreased). She exhibits no swelling, no erythema, normal alignment, no LCL laxity, normal patellar mobility, normal meniscus and no MCL laxity. Tenderness found. Medial joint line tenderness noted.  Left knee: She exhibits decreased range of motion (Mildly decreased range of motion). She exhibits no swelling, normal alignment, no LCL laxity, normal patellar mobility, normal meniscus and no MCL laxity. Tenderness found. Medial joint line tenderness noted.  3 out of 5 strength in bilateral lower extremities, 4 out of 5 strength in both arms. Range of motion intact.  Neurological: She is alert and oriented to person, place, and time. She has normal reflexes. She displays no atrophy. No sensory deficit. She exhibits  abnormal muscle tone. Gait abnormal.  Skin: Skin is warm and dry. No rash noted. She is not diaphoretic.  Psychiatric: She has a normal mood and affect. Her behavior is normal.  Nursing note and vitals reviewed.     Assessment & Plan:   Problem List Items Addressed This Visit      Musculoskeletal and Integument   OA (osteoarthritis) of knee    Other Visit Diagnoses    Gait instability    -  Primary   Patient is coming in for evaluation for motorized wheelchair   History of rickets       Recurrent falls           Follow up plan: Return if symptoms worsen or fail to improve.  Counseling provided for all of the vaccine components No orders of the defined types were placed in this encounter.   Caryl Pina, MD [facility] 06/07/2017, 1:56 PM

## 2017-06-22 ENCOUNTER — Encounter: Payer: Self-pay | Admitting: Family Medicine

## 2017-06-22 ENCOUNTER — Ambulatory Visit (INDEPENDENT_AMBULATORY_CARE_PROVIDER_SITE_OTHER): Payer: BLUE CROSS/BLUE SHIELD | Admitting: Family Medicine

## 2017-06-22 VITALS — BP 133/88 | HR 86 | Temp 98.0°F | Ht 59.0 in | Wt 230.0 lb

## 2017-06-22 DIAGNOSIS — N841 Polyp of cervix uteri: Secondary | ICD-10-CM

## 2017-06-22 DIAGNOSIS — Z23 Encounter for immunization: Secondary | ICD-10-CM

## 2017-06-22 DIAGNOSIS — R7303 Prediabetes: Secondary | ICD-10-CM

## 2017-06-22 DIAGNOSIS — Z01419 Encounter for gynecological examination (general) (routine) without abnormal findings: Secondary | ICD-10-CM | POA: Diagnosis not present

## 2017-06-22 DIAGNOSIS — E039 Hypothyroidism, unspecified: Secondary | ICD-10-CM

## 2017-06-22 DIAGNOSIS — Z0000 Encounter for general adult medical examination without abnormal findings: Secondary | ICD-10-CM

## 2017-06-22 DIAGNOSIS — M174 Other bilateral secondary osteoarthritis of knee: Secondary | ICD-10-CM | POA: Diagnosis not present

## 2017-06-22 LAB — BAYER DCA HB A1C WAIVED: HB A1C (BAYER DCA - WAIVED): 5.5 % (ref <7.0)

## 2017-06-22 MED ORDER — METHYLPREDNISOLONE ACETATE 80 MG/ML IJ SUSP
80.0000 mg | Freq: Once | INTRAMUSCULAR | Status: AC
  Administered 2017-06-22: 80 mg via INTRAMUSCULAR

## 2017-06-22 NOTE — Progress Notes (Addendum)
BP 133/88   Pulse 86   Temp 98 F (36.7 C) (Oral)   Ht _0  (1.499 m)   Wt 230 lb (104.3 kg)   BMI 46.45 kg/m    Subjective:    Patient ID: Rebecca Miles, female    DOB: 02-29-1966, 51 y.o.   MRN: [id number]  HPI: Rebecca Miles is a 51 y.o. female presenting on 06/22/2017 for Gynecologic Exam and Knee Pain (bilateral knee pain, would like injection)   HPI Well woman exam and gynecological exam Patient is coming in today for well woman exam and gynecological exam. It has been quite some time since she's had a previous Pap smear and she has never had a mammogram. She denies any breast issues that she knows of. She does have inverted nipples that she's had those forever and there is nothing new to those. She denies any lumps bumps or discharge from her breasts. She does have occasional vaginal discharge but she denies any irritation and that's about her baseline. She denies any fevers or chills.  Bilateral knee pain Patient has bilateral knee pain due to osteoarthritis and recommended causing abnormalities in her joints. She would like repeat injections today.  Relevant past medical, surgical, family and social history reviewed and updated as indicated. Interim medical history since our last visit reviewed. Allergies and medications reviewed and updated.  Review of Systems  Constitutional: Negative for chills and fever.  HENT: Negative for ear pain and tinnitus.   Eyes: Negative for pain, redness and visual disturbance.  Respiratory: Negative for cough, chest tightness, shortness of breath and wheezing.   Cardiovascular: Negative for chest pain, palpitations and leg swelling.  Gastrointestinal: Negative for abdominal pain, blood in stool, constipation and diarrhea.  Genitourinary: Positive for vaginal discharge. Negative for decreased urine volume, dysuria, frequency, hematuria, menstrual problem, vaginal bleeding and vaginal pain.  Musculoskeletal: Positive for  arthralgias and joint swelling. Negative for back pain, gait problem and myalgias.  Skin: Negative for rash.  Neurological: Negative for dizziness, weakness, light-headedness and headaches.  Psychiatric/Behavioral: Negative for agitation, behavioral problems and suicidal ideas.  All other systems reviewed and are negative.   Per HPI unless specifically indicated above        Objective:    BP 133/88   Pulse 86   Temp 98 F (36.7 C) (Oral)   Ht _1  (1.499 m)   Wt 230 lb (104.3 kg)   BMI 46.45 kg/m   Wt Readings from Last 3 Encounters:  06/22/17 230 lb (104.3 kg)  06/07/17 227 lb (103 kg)  05/21/17 227 lb 6 oz (103.1 kg)    Physical Exam  Constitutional: She is oriented to person, place, and time. She appears well-developed and well-nourished. No distress.  Eyes: Conjunctivae are normal.  Neck: Neck supple. No thyromegaly present.  Cardiovascular: Normal rate, regular rhythm, normal heart sounds and intact distal pulses.   No murmur heard. Pulmonary/Chest: Effort normal and breath sounds normal. No respiratory distress. She has no wheezes. Right breast exhibits inverted nipple. Right breast exhibits no mass, no nipple discharge, no skin change and no tenderness. Left breast exhibits inverted nipple. Left breast exhibits no mass, no nipple discharge, no skin change and no tenderness. Breasts are symmetrical.  Abdominal: Soft. Bowel sounds are normal. She exhibits no distension. There is no tenderness. There is no rebound and no guarding.  Genitourinary: Vagina normal and uterus normal. No breast swelling, tenderness, discharge or bleeding. Pelvic exam was performed with patient supine. There  is no rash or lesion on the right labia. There is no rash or lesion on the left labia. Uterus is not deviated, not enlarged, not fixed and not tender. Cervix exhibits no motion tenderness, no discharge and no friability. Right adnexum displays no mass and no tenderness. Left adnexum displays no  mass and no tenderness.  Genitourinary Comments: Patient has a polyp hanging out her cervix, we have taken pictures of it, and is slightly pink or than the surrounding tissue and is a good-sized polyp. Will refer to gynecology  Musculoskeletal: Normal range of motion. She exhibits no edema.  Lymphadenopathy:    She has no cervical adenopathy.    She has no axillary adenopathy.  Neurological: She is alert and oriented to person, place, and time. Coordination normal.  Skin: Skin is warm and dry. No rash noted. She is not diaphoretic.  Psychiatric: She has a normal mood and affect. Her behavior is normal.  Vitals reviewed.        Knee injection 2: Consent form signed. Risk factors of bleeding and infection discussed with patient and patient is agreeable towards injection. Patient prepped with Betadine. Lateral approach towards injection used. Injected 80 mg of Depo-Medrol and 1 mL of 2% lidocaine. Patient tolerated procedure well and no side effects from noted. Minimal to no bleeding. Simple bandage applied after.     Assessment & Plan:   Problem List Items Addressed This Visit      Endocrine   Hypothyroidism   Relevant Orders   TSH (Completed)     Musculoskeletal and Integument   OA (osteoarthritis) of knee   Relevant Medications   methylPREDNISolone acetate (DEPO-MEDROL) injection 80 mg (Completed)   methylPREDNISolone acetate (DEPO-MEDROL) injection 80 mg (Completed)     Other   Prediabetes   Relevant Orders   Bayer DCA Hb A1c Waived (Completed)    Other Visit Diagnoses    Well woman exam with routine gynecological exam    -  Primary   Relevant Orders   Pap IG, CT/NG w/ reflex HPV when ASC-U (Completed)   Bayer DCA Hb A1c Waived (Completed)   CMP14+EGFR (Completed)   Lipid panel (Completed)   Cervical polyp       Relevant Orders   Ambulatory referral to Gynecology   Need for immunization against influenza       Relevant Orders   Flu Vaccine QUAD 36+ mos IM  (Completed)       Follow up plan: Return in about 6 months (around 12/21/2017), or if symptoms worsen or fail to improve, for Thyroid and prediabetes.  Counseling provided for all of the vaccine components No orders of the defined types were placed in this encounter.   Caryl Pina, MD [facility] 06/22/2017, 2:30 PM

## 2017-06-24 LAB — CMP14+EGFR
ALBUMIN: 4 g/dL (ref 3.5–5.5)
ALK PHOS: 111 IU/L (ref 39–117)
ALT: 13 IU/L (ref 0–32)
AST: 15 IU/L (ref 0–40)
Albumin/Globulin Ratio: 1.5 (ref 1.2–2.2)
BILIRUBIN TOTAL: 0.3 mg/dL (ref 0.0–1.2)
BUN/Creatinine Ratio: 11 (ref 9–23)
BUN: 7 mg/dL (ref 6–24)
CHLORIDE: 105 mmol/L (ref 96–106)
CO2: 24 mmol/L (ref 20–29)
Calcium: 9.2 mg/dL (ref 8.7–10.2)
Creatinine, Ser: 0.64 mg/dL (ref 0.57–1.00)
GFR calc Af Amer: 121 mL/min/{1.73_m2} (ref >59)
GFR, EST NON AFRICAN AMERICAN: 105 mL/min/{1.73_m2} (ref >59)
GLOBULIN, TOTAL: 2.7 g/dL (ref 1.5–4.5)
Glucose: 87 mg/dL (ref 65–99)
POTASSIUM: 3.8 mmol/L (ref 3.5–5.2)
Sodium: 142 mmol/L (ref 134–144)
Total Protein: 6.7 g/dL (ref 6.0–8.5)

## 2017-06-24 LAB — LIPID PANEL
CHOL/HDL RATIO: 3.2 ratio (ref 0.0–4.4)
Cholesterol, Total: 158 mg/dL (ref 100–199)
HDL: 49 mg/dL (ref >39)
LDL Calculated: 94 mg/dL (ref 0–99)
TRIGLYCERIDES: 77 mg/dL (ref 0–149)
VLDL Cholesterol Cal: 15 mg/dL (ref 5–40)

## 2017-06-24 LAB — TSH: TSH: 0.019 u[IU]/mL — ABNORMAL LOW (ref 0.450–4.500)

## 2017-06-25 LAB — PAP IG, CT-NG, RFX HPV ASCU
Chlamydia, Nuc. Acid Amp: NEGATIVE
GONOCOCCUS BY NUCLEIC ACID AMP: NEGATIVE
PAP Smear Comment: 0

## 2017-06-25 MED ORDER — LEVOTHYROXINE SODIUM 137 MCG PO CAPS: 137.0000 ug | ORAL_CAPSULE | Freq: Every day | ORAL | 5 refills | Status: DC

## 2017-06-25 NOTE — Addendum Note (Signed)
Addended byFaylene Million C on: 06/25/2017 12:52 PM   Modules accepted: Orders

## 2017-07-24 ENCOUNTER — Telehealth: Payer: Self-pay | Admitting: Family Medicine

## 2017-07-24 NOTE — Telephone Encounter (Signed)
Patient is aware of lab results.

## 2017-07-26 DIAGNOSIS — N841 Polyp of cervix uteri: Secondary | ICD-10-CM | POA: Diagnosis not present

## 2017-07-26 DIAGNOSIS — Z6841 Body Mass Index (BMI) 40.0 and over, adult: Secondary | ICD-10-CM | POA: Diagnosis not present

## 2017-08-29 ENCOUNTER — Telehealth: Payer: Self-pay | Admitting: *Deleted

## 2017-08-29 NOTE — Telephone Encounter (Signed)
Fax received Alternative requested Pt paying $42 for olmesartan 20 mg tab 1 qd Suggested alternatives:  Losartan potassium 50 mg $7 Irbesartan 150 mg  $27 Please advise and send new Rx into [facility]

## 2017-08-29 NOTE — Telephone Encounter (Signed)
We can do the losartan 50 mg which would be cheapest as long as the pharmacy has some that is not on recall.  There is just been so much of it that has been on recall that I have avoided it.  Give her 6 months worth.  If she is wary of this then we can go ahead and do that here irbesartan 150 mg and give her 6 months worth of that.

## 2017-08-30 MED ORDER — LOSARTAN POTASSIUM 50 MG PO TABS: 50.0000 mg | ORAL_TABLET | Freq: Every day | ORAL | 1 refills | Status: DC

## 2017-08-30 NOTE — Addendum Note (Signed)
Addended by: Marylin Crosby on: 08/30/2017 08:08 AM   Modules accepted: Orders

## 2017-09-21 ENCOUNTER — Other Ambulatory Visit: Payer: Self-pay | Admitting: *Deleted

## 2017-09-21 MED ORDER — LEVOTHYROXINE SODIUM 137 MCG PO CAPS: 137.0000 ug | ORAL_CAPSULE | Freq: Every day | ORAL | 0 refills | Status: DC

## 2017-09-24 ENCOUNTER — Telehealth: Payer: Self-pay | Admitting: *Deleted

## 2017-09-24 NOTE — Addendum Note (Signed)
Addended by: Zannie Cove on: 09/24/2017 01:55 PM   Modules accepted: Orders

## 2017-09-24 NOTE — Telephone Encounter (Signed)
I do not know why this is coming again, we dealt with this last month and we had sent in losartan for her when he ate 2018

## 2017-09-24 NOTE — Telephone Encounter (Signed)
Rebecca Miles, see note

## 2017-09-24 NOTE — Telephone Encounter (Signed)
Fax received Pt paying $105 for Olmesartan Medoxomil 20 mg 1 qd Alternatives suggested lower out of pocket costs Losartan potassium 50 mg $9 Irbesartan 150 mg $23 Valsartan 160 mg $54 Telmisartan 40 mg $76 Please advise [facility]

## 2017-10-11 ENCOUNTER — Encounter: Payer: Self-pay | Admitting: *Deleted

## 2017-10-12 ENCOUNTER — Encounter: Payer: Self-pay | Admitting: Family Medicine

## 2017-10-12 ENCOUNTER — Ambulatory Visit (INDEPENDENT_AMBULATORY_CARE_PROVIDER_SITE_OTHER): Payer: BLUE CROSS/BLUE SHIELD | Admitting: Family Medicine

## 2017-10-12 VITALS — BP 136/86 | HR 101 | Temp 98.3°F | Ht 59.0 in | Wt 224.0 lb

## 2017-10-12 DIAGNOSIS — M174 Other bilateral secondary osteoarthritis of knee: Secondary | ICD-10-CM

## 2017-10-12 MED ORDER — METHYLPREDNISOLONE ACETATE 80 MG/ML IJ SUSP
80.0000 mg | Freq: Once | INTRAMUSCULAR | Status: AC
  Administered 2017-10-12: 80 mg via INTRAMUSCULAR

## 2017-10-12 NOTE — Progress Notes (Signed)
BP 136/86   Pulse (!) 101   Temp 98.3 F (36.8 C) (Oral)   Ht 4\' 11"  (1.499 m)   Wt 224 lb (101.6 kg)   BMI 45.24 kg/m    Subjective:    Patient ID: Rebecca Miles, female    DOB: 1966/02/05, 52 y.o.   MRN: [id number]  HPI: Rebecca Miles is a 52 y.o. female presenting on 10/12/2017 for Joint Pain (both knees, both hands)   HPI Bilateral knee pain, returned And is coming in for recheck on both of her knees.  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  She denies any fevers or chills or redness or warmth or giving way or popping or catching.  She is also trying to get a motorized chair to help with mobility but she is still using a cane to help walk around and shorter distances.  Pain is moderate  Relevant past medical, surgical, family and social history reviewed and updated as indicated. Interim medical history since our last visit reviewed. Allergies and medications reviewed and updated.  Review of Systems  Constitutional: Negative for chills and fever.  Eyes: Negative for visual disturbance.  Respiratory: Negative for chest tightness and shortness of breath.   Cardiovascular: Negative for chest pain and leg swelling.  Musculoskeletal: Positive for arthralgias. Negative for back pain, gait problem and joint swelling.  Skin: Negative for color change and rash.  Neurological: Negative for light-headedness and headaches.  Psychiatric/Behavioral: Negative for agitation and behavioral problems.  All other systems reviewed and are negative.   Per HPI unless specifically indicated above      Objective:    BP 136/86   Pulse (!) 101   Temp 98.3 F (36.8 C) (Oral)   Ht  4\' 11"  (1.499 m)   Wt 224 lb (101.6 kg)   BMI 45.24 kg/m   Wt Readings from Last 3 Encounters:  10/12/17 224 lb (101.6 kg)  06/22/17 230 lb (104.3 kg)  06/07/17 227 lb (103 kg)    Physical Exam  Constitutional: She is oriented to person, place, and time. She appears well-developed and well-nourished. No distress.  Eyes: Conjunctivae are normal.  Cardiovascular: Normal rate, regular rhythm, normal heart sounds and intact distal pulses.  No murmur heard. Pulmonary/Chest: Effort normal and breath sounds normal. No respiratory distress. She has no wheezes.  Musculoskeletal: Normal range of motion. She exhibits tenderness. She exhibits no edema.       Right knee: She exhibits swelling and abnormal patellar mobility. She exhibits normal alignment, no LCL laxity, normal meniscus and no MCL laxity. Tenderness found. Medial joint line and lateral joint line tenderness noted.       Left knee: She exhibits swelling and abnormal patellar mobility. She exhibits normal range of motion, normal alignment, no LCL laxity, normal meniscus and no MCL laxity. Tenderness found. Medial joint line and lateral joint line tenderness noted.  Neurological: She is alert and oriented to person, place, and time. Coordination normal.  Skin: Skin is warm and dry. No rash noted. She is not diaphoretic.  Psychiatric: She has a normal mood and affect. Her behavior is normal.  Nursing note and vitals  reviewed.   Knee injection bilateral: Verbal consent obtained. Risk factors of bleeding and infection discussed with patient and patient is agreeable towards injection. Patient prepped with Betadine. Lateral approach towards injection used. Injected 80 mg of Depo-Medrol and 1 mL of 2% lidocaine. Patient tolerated procedure well and no side effects from noted. Minimal to no bleeding. Simple bandage applied after.     Assessment & Plan:   Problem List Items Addressed This Visit      Musculoskeletal and Integument   OA  (osteoarthritis) of knee - Primary   Relevant Medications   methylPREDNISolone acetate (DEPO-MEDROL) injection 80 mg   methylPREDNISolone acetate (DEPO-MEDROL) injection 80 mg       Follow up plan: Return if symptoms worsen or fail to improve.  Counseling provided for all of the vaccine components No orders of the defined types were placed in this encounter.   Caryl Pina, MD [facility] 10/12/2017, 4:07 PM

## 2017-10-25 ENCOUNTER — Ambulatory Visit (INDEPENDENT_AMBULATORY_CARE_PROVIDER_SITE_OTHER): Payer: Medicare Other | Admitting: *Deleted

## 2017-10-25 ENCOUNTER — Encounter: Payer: Self-pay | Admitting: *Deleted

## 2017-10-25 VITALS — BP 133/84 | HR 94 | Ht 59.0 in | Wt 220.0 lb

## 2017-10-25 DIAGNOSIS — E039 Hypothyroidism, unspecified: Secondary | ICD-10-CM

## 2017-10-25 DIAGNOSIS — Z0000 Encounter for general adult medical examination without abnormal findings: Secondary | ICD-10-CM

## 2017-10-25 NOTE — Patient Instructions (Signed)
  Rebecca Miles , Thank you for taking time to come for your Medicare Wellness Visit. I appreciate your ongoing commitment to your health goals. Please review the following plan we discussed and let me know if I can assist you in the future.   These are the goals we discussed: Increase physical activity. Aim for 150 minutes of activity a week.   This is a list of the screening recommended for you and due dates:  Health Maintenance  Topic Date Due  . HIV Screening  09/13/1982  . Mammogram  09/13/2017  . Colon Cancer Screening  09/13/2017  . Tetanus Vaccine  10/12/2018*  . Pap Smear  06/22/2020  . Flu Shot  Completed  *Topic was postponed. The date shown is not the original due date.     Your doctor has prescribed Cologuard, an easy-to-use, noninvasive test for colon cancer screening, based on the latest advances in stool DNA science.   Here's what will happen next:  1. You may receive a call or email from [facility] to confirm your mailing address and insurance information 2. Your kit will be shipped directly to you 3. You collet your stool sample in the privacy of your own home 4. You return the kit via [facility] shipping or pick-up, in the same box it arrived in 5. You doctor will contact you with the results once they are available  Screening for colon cancer is very important to your good health, so if you have any questions at all, please call [employer]'s Customer Support Specialists at [phone]. They are available 24 hours a day, 6 days a week.

## 2017-10-25 NOTE — Progress Notes (Signed)
Subjective:   Rebecca Miles is a 52 y.o. female who presents for an Initial Medicare Annual Wellness Visit. Rebecca Miles is married and lives at home with her husband. They have four children. She homeschools her youngest son who is 46. She also enjoys crocheting, knitting, and sewing.   Review of Systems    Health is better than last year.   Cardiac Risk Factors include: obesity (BMI >30kg/m2);sedentary lifestyle   Other systems negative today     Objective:    BP 133/84 (BP Location: Left Wrist, Patient Position: Sitting, Cuff Size: Small)   Pulse 94   Ht 4\' 11"  (1.499 m)   Wt 220 lb (99.8 kg)   BMI 44.43 kg/m   Advanced Directives 10/25/2017 09/26/2012 09/17/2012  Does Patient Have a Medical Advance Directive? No Patient does not have advance directive;Patient would not like information Patient does not have advance directive  Would patient like information on creating a medical advance directive? No - Patient declined - -  Pre-existing out of facility DNR order (yellow form or pink MOST form) - No -    Current Medications (verified) Outpatient Encounter Medications as of 10/25/2017  Medication Sig  . hydrochlorothiazide (HYDRODIURIL) 12.5 MG tablet Take 1 tablet (12.5 mg total) by mouth daily.  . Iron-Vitamins (GERITOL COMPLETE PO) Take 1 tablet by mouth daily.  . Levothyroxine Sodium 137 MCG CAPS Take 1 capsule (137 mcg total) by mouth daily before breakfast.  . losartan (COZAAR) 50 MG tablet Take 1 tablet (50 mg total) by mouth daily.  . phosphorus (K PHOS NEUTRAL) 155-852-130 MG tablet Take by mouth 2 (two) times daily.  . traMADol (ULTRAM) 50 MG tablet Take by mouth every 6 (six) hours as needed.   No facility-administered encounter medications on file as of 10/25/2017.     Allergies (verified) Patient has no known allergies.   History: Past Medical History:  Diagnosis Date  . Allergy   . Arthritis   . Hypertension   . Hypothyroidism   . Osteoarthritis      ankles and joints  . Rickets    Past Surgical History:  Procedure Laterality Date  . CESAREAN SECTION    . FEMUR IM NAIL  09/26/2012   Procedure: INTRAMEDULLARY (IM) RETROGRADE FEMORAL NAILING;  Surgeon: Gearlean Alf, MD;  Location: [facility];  Service: Orthopedics;  Laterality: Right;  . LEG SURGERY     bilateral at age 59   . TIBIA OSTEOTOMY  09/26/2012   Procedure: TIBIAL OSTEOTOMY;  Surgeon: Gearlean Alf, MD;  Location: [facility];  Service: Orthopedics;  Laterality: Right;  RIGHT FEMORAL CORRECTIVE OSTEOMITY WITH RETROGRADE IM FEMORAL NAIL  . TUBAL LIGATION    . TUBAL LIGATION     Family History  Problem Relation Age of Onset  . Diabetes Mother   . Hypertension Mother   . Kidney disease Mother   . Parkinson's disease Father   . Lupus Sister   . Drug abuse Brother   . Early death Brother   . Stroke Brother   . Arthritis Maternal Grandmother   . Hypertension Maternal Grandmother   . Kidney disease Maternal Grandmother   . Graves' disease Son   . Myasthenia gravis Son   . Rickets Son   . Rickets Son   . Rickets Son   . ADD / ADHD Son    Social History   Socioeconomic History  . Marital status: Married    Spouse name: Not on file  .  Number of children: 4  . Years of education: 11  . Highest education level: Some college, no degree  Social Needs  . Financial resource strain: Not hard at all  . Food insecurity - worry: Never true  . Food insecurity - inability: Never true  . Transportation needs - medical: No  . Transportation needs - non-medical: No  Occupational History  . Occupation: disabled  Tobacco Use  . Smoking status: Former Smoker    Packs/day: 0.50    Years: 20.00    Pack years: 10.00    Last attempt to quit: 09/12/1999    Years since quitting: 18.1  . Smokeless tobacco: Never Used  Substance and Sexual Activity  . Alcohol use: No    Comment: occasional  . Drug use: No  . Sexual activity: Not Currently  Other Topics Concern  . Not on file   Social History Narrative  . Not on file    Tobacco Counseling Counseling given: No Comment: No current tobacco use   Clinical Intake:   Pain : 0-10 Pain Score: 2  Pain Type: Chronic pain Pain Location: Generalized Pain Onset: More than a month ago Pain Frequency: Constant Effect of Pain on Daily Activities: moderate    Nutritional Status: BMI 25 -29 Overweight Diabetes: No  How often do you need to have someone help you when you read instructions, pamphlets, or other written materials from your doctor or pharmacy?: 1 - Never What is the last grade level you completed in school?: some college  Interpreter Needed?: No  Information entered by :: Chong Sicilian, RN   Activities of Daily Living In your present state of health, do you have any difficulty performing the following activities: 10/25/2017  Hearing? N  Vision? N  Difficulty concentrating or making decisions? N  Walking or climbing stairs? N  Dressing or bathing? N  Doing errands, shopping? N  Preparing Food and eating ? N  Using the Toilet? N  In the past six months, have you accidently leaked urine? Y  Comment some urge incontinence  Do you have problems with loss of bowel control? N  Managing your Medications? N  Managing your Finances? N  Housekeeping or managing your Housekeeping? N  Some recent data might be hidden     Immunizations and Health Maintenance Immunization History  Administered Date(s) Administered  . Influenza,inj,Quad PF,6+ Mos 06/22/2017   Health Maintenance Due  Topic Date Due  . HIV Screening  09/13/1982  . MAMMOGRAM  09/13/2017  . COLONOSCOPY  09/13/2017    Patient Care Team: Dettinger, Fransisca Kaufmann, MD as PCP - General (Family Medicine)       Assessment:   This is a routine wellness examination for Rebecca Miles.  Hearing/Vision screen No exam data present  Dietary issues and exercise activities discussed: Current Exercise Habits: The patient does not participate in  regular exercise at present, Exercise limited by: orthopedic condition(s)  Goals Exercise for 150 minutes a week  Depression Screen PHQ 2/9 Scores 10/25/2017 10/12/2017 06/22/2017 06/07/2017 05/21/2017  PHQ - 2 Score 0 0 0 0 3  PHQ- 9 Score - - - - 9    Fall Risk Fall Risk  10/25/2017 10/12/2017 06/07/2017  Falls in the past year? Yes No Yes  Number falls in past yr: 2 or more - 2 or more  Injury with Fall? No - Yes  Comment - - bruising, scrapes, soreness  Risk for fall due to : History of fall(s);Impaired balance/gait - -  Follow up Education  provided - -    Cognitive Function: MMSE - Mini Mental State Exam 10/25/2017  Orientation to time 5  Orientation to Place 5  Registration 3  Attention/ Calculation 5  Recall 3  Language- name 2 objects 2  Language- repeat 1  Language- follow 3 step command 3  Language- read & follow direction 1  Write a sentence 1  Copy design 1  Total score 30        Screening Tests Health Maintenance  Topic Date Due  . HIV Screening  09/13/1982  . MAMMOGRAM  09/13/2017  . COLONOSCOPY  09/13/2017  . TETANUS/TDAP  10/12/2018 (Originally 09/13/1986)  . PAP SMEAR  06/22/2020  . INFLUENZA VACCINE  Completed    Plan:  Increase physical activity as tolerated. Aim for 150 minutes of moderate activity a week Keep f/u with PCP Need repeat TSH since dose was increased at last visit Cologuard ordered Schedule mammogram  I have personally reviewed and noted the following in the patient's chart:   . Medical and social history . Use of alcohol, tobacco or illicit drugs  . Current medications and supplements . Functional ability and status . Nutritional status . Physical activity . Advanced directives . List of other physicians . Hospitalizations, surgeries, and ER visits in previous 12 months . Vitals . Screenings to include cognitive, depression, and falls . Referrals and appointments  In addition, I have reviewed and discussed with patient  certain preventive protocols, quality metrics, and best practice recommendations. A written personalized care plan for preventive services as well as general preventive health recommendations were provided to patient.     Chong Sicilian, RN   10/25/2017

## 2017-10-26 ENCOUNTER — Encounter: Payer: Self-pay | Admitting: *Deleted

## 2017-11-05 ENCOUNTER — Encounter: Payer: Self-pay | Admitting: Family Medicine

## 2017-11-05 DIAGNOSIS — Z1231 Encounter for screening mammogram for malignant neoplasm of breast: Secondary | ICD-10-CM | POA: Diagnosis not present

## 2017-11-07 DIAGNOSIS — Z1211 Encounter for screening for malignant neoplasm of colon: Secondary | ICD-10-CM | POA: Diagnosis not present

## 2017-11-07 DIAGNOSIS — Z1212 Encounter for screening for malignant neoplasm of rectum: Secondary | ICD-10-CM | POA: Diagnosis not present

## 2017-11-15 LAB — COLOGUARD: COLOGUARD: NEGATIVE

## 2017-12-10 ENCOUNTER — Ambulatory Visit: Payer: Medicare Other | Admitting: Family Medicine

## 2017-12-12 ENCOUNTER — Encounter: Payer: Self-pay | Admitting: Family Medicine

## 2017-12-20 ENCOUNTER — Ambulatory Visit (INDEPENDENT_AMBULATORY_CARE_PROVIDER_SITE_OTHER): Payer: Medicare Other | Admitting: Family Medicine

## 2017-12-20 ENCOUNTER — Encounter: Payer: Self-pay | Admitting: Family Medicine

## 2017-12-20 VITALS — BP 125/87 | HR 91 | Temp 98.5°F | Ht 59.0 in | Wt 220.0 lb

## 2017-12-20 DIAGNOSIS — I10 Essential (primary) hypertension: Secondary | ICD-10-CM

## 2017-12-20 DIAGNOSIS — E039 Hypothyroidism, unspecified: Secondary | ICD-10-CM

## 2017-12-20 DIAGNOSIS — R7303 Prediabetes: Secondary | ICD-10-CM

## 2017-12-20 NOTE — Progress Notes (Signed)
BP 125/87   Pulse 91   Temp 98.5 F (36.9 C) (Oral)   Ht 4' 11"  (1.499 m)   Wt 220 lb (99.8 kg)   BMI 44.43 kg/m    Subjective:    Patient ID: Rebecca Miles, female    DOB: 1965/11/09, 52 y.o.   MRN: [id number]  HPI: Rebecca Miles is a 52 y.o. female presenting on 12/20/2017 for Hypothyroidism (6 mo); Hypertension; Diabetes; and Discuss need for Hoveround   HPI Hypothyroidism recheck Patient is coming in for thyroid recheck today as well. They deny any issues with hair changes or heat or cold problems or diarrhea or constipation. They deny any chest pain or palpitations. They are currently on levothyroxine 137 micrograms   Hypertension Patient is currently on hydrochlorothiazide and losartan, and their blood pressure today is 125/87. Patient denies any lightheadedness or dizziness. Patient denies headaches, blurred vision, chest pains, shortness of breath, or weakness. Denies any side effects from medication and is content with current medication.   Prediabetes Patient comes in today for recheck of his diabetes. Patient has been currently taking no medication and we are monitoring and she has been following diet and lifestyle changes. Patient is currently on an ACE inhibitor/ARB. Patient has not seen an ophthalmologist this year. Patient denies any issues with their feet.   Relevant past medical, surgical, family and social history reviewed and updated as indicated. Interim medical history since our last visit reviewed. Allergies and medications reviewed and updated.  Review of Systems  Constitutional: Negative for chills and fever.  HENT: Negative for congestion, ear discharge and ear pain.   Eyes: Negative for redness and visual disturbance.  Respiratory: Negative for chest tightness and shortness of breath.   Cardiovascular: Negative for chest pain and leg swelling.  Genitourinary: Negative for difficulty urinating and dysuria.  Musculoskeletal: Positive for  arthralgias and gait problem. Negative for back pain.  Skin: Negative for rash.  Neurological: Negative for light-headedness and headaches.  Psychiatric/Behavioral: Negative for agitation and behavioral problems.  All other systems reviewed and are negative.   Per HPI unless specifically indicated above   Allergies as of 12/20/2017   No Known Allergies     Medication List        Accurate as of 12/20/17 11:59 PM. Always use your most recent med list.          GERITOL COMPLETE PO Take 1 tablet by mouth daily.   hydrochlorothiazide 12.5 MG tablet Commonly known as:  HYDRODIURIL Take 1 tablet (12.5 mg total) by mouth daily.   Levothyroxine Sodium 137 MCG Caps Take 1 capsule (137 mcg total) by mouth daily before breakfast.   losartan 50 MG tablet Commonly known as:  COZAAR Take 1 tablet (50 mg total) by mouth daily.   phosphorus 155-852-130 MG tablet Commonly known as:  K PHOS NEUTRAL Take by mouth 2 (two) times daily.   traMADol 50 MG tablet Commonly known as:  ULTRAM Take by mouth every 6 (six) hours as needed.          Objective:    BP 125/87   Pulse 91   Temp 98.5 F (36.9 C) (Oral)   Ht 4' 11"  (1.499 m)   Wt 220 lb (99.8 kg)   BMI 44.43 kg/m   Wt Readings from Last 3 Encounters:  12/20/17 220 lb (99.8 kg)  10/25/17 220 lb (99.8 kg)  10/12/17 224 lb (101.6 kg)    Physical Exam  Constitutional: She is oriented to  person, place, and time. She appears well-developed and well-nourished. No distress.  Eyes: Pupils are equal, round, and reactive to light. Conjunctivae and EOM are normal.  Cardiovascular: Normal rate, regular rhythm, normal heart sounds and intact distal pulses.  No murmur heard. Pulmonary/Chest: Effort normal and breath sounds normal. No respiratory distress. She has no wheezes.  Neurological: She is alert and oriented to person, place, and time.  Skin: Skin is warm and dry. No rash noted. She is not diaphoretic.  Psychiatric: She has a  normal mood and affect. Her behavior is normal.  Nursing note and vitals reviewed.   Results for orders placed or performed in visit on 11/21/17  Cologuard  Result Value Ref Range   Cologuard Negative       Assessment & Plan:   Problem List Items Addressed This Visit      Cardiovascular and Mediastinum   Hypertension   Relevant Orders   CMP14+EGFR   Lipid panel     Endocrine   Hypothyroidism   Relevant Orders   CBC with Differential/Platelet   TSH     Other   Prediabetes - Primary   Relevant Orders   Bayer DCA Hb A1c Waived   Lipid panel       Follow up plan: Return in about 3 months (around 03/21/2018), or if symptoms worsen or fail to improve, for Prediabetes and thyroid and hypertension follow-up.  Counseling provided for all of the vaccine components Orders Placed This Encounter  Procedures  . Bayer DCA Hb A1c Waived  . CMP14+EGFR  . CBC with Differential/Platelet  . Lipid panel  . TSH    Caryl Pina, MD [facility] 12/21/2017, 6:49 PM

## 2017-12-21 ENCOUNTER — Ambulatory Visit: Payer: Medicare Other | Admitting: Family Medicine

## 2017-12-31 ENCOUNTER — Ambulatory Visit (INDEPENDENT_AMBULATORY_CARE_PROVIDER_SITE_OTHER): Payer: Medicare Other | Admitting: Family Medicine

## 2017-12-31 ENCOUNTER — Encounter: Payer: Self-pay | Admitting: Family Medicine

## 2017-12-31 VITALS — BP 138/89 | HR 105 | Temp 98.2°F | Ht 59.0 in | Wt 220.0 lb

## 2017-12-31 DIAGNOSIS — M174 Other bilateral secondary osteoarthritis of knee: Secondary | ICD-10-CM

## 2017-12-31 DIAGNOSIS — R262 Difficulty in walking, not elsewhere classified: Secondary | ICD-10-CM | POA: Insufficient documentation

## 2017-12-31 DIAGNOSIS — N3281 Overactive bladder: Secondary | ICD-10-CM | POA: Diagnosis not present

## 2017-12-31 DIAGNOSIS — M160 Bilateral primary osteoarthritis of hip: Secondary | ICD-10-CM | POA: Diagnosis not present

## 2017-12-31 DIAGNOSIS — E550 Rickets, active: Secondary | ICD-10-CM | POA: Insufficient documentation

## 2017-12-31 NOTE — Progress Notes (Addendum)
BP 138/89   Pulse (!) 105   Temp 98.2 F (36.8 C) (Oral)   Ht 4\' 11"  (1.499 m)   Wt 220 lb (99.8 kg)   BMI 44.43 kg/m    Subjective:    Patient ID: Rebecca Miles, female    DOB: 06/10/1966, 52 y.o.   MRN: [id number]  HPI: Rebecca Miles is a 51 y.o. female presenting on 12/31/2017 for Discuss need for Hoveround Pulse ox 92% on room air while ambulating and 96% on room air while resting  HPI Pain and difficulty ambulating and mobility examination Patient comes in today for a face-to-face evaluation for a powered wheelchair/Hoveround.  Patient has a chronic history of rickets since she was a child and has had surgeries on her right femur to try and correct the deformity but she is still remained deformed and that she is very bowlegged.  Because of her being bowlegged it has limited her mobility throughout her life and has led to significant osteoarthritis in both of her knees and her hips and also led to her being obese because she has not been able to be mobile because of the pain.  She currently is using a cane for mobility and she cannot walk even short distances of 5 feet without the cane and with the cane she cannot walk more than a half a block or 2 grocery store aisles which is about 100 feet without having to stop from the pain.  Frequently at the store currently she is using their power mobility devices unless there is not one available.  She does not get out of her house frequently because of her very limited mobility with a cane.  The pain has been worsening because of arthritis and it is gradually been getting harder over the past few years because of the bilateral knee and hip pain for her to be able to ambulate.  She is currently using tramadol and ibuprofen and Tylenol to help with the pain which gives her some function but still very limited.  Her pace of ambulation with a cane is less than 1 mph currently and this can cause a significant issue with her overactive bladder  and her being able to make it to the restroom even in her own house safely without falling.  She has had 2 falls in the past year where she snagged her foot on a small step and fell onto her side or front.  She did not sustain any fractures at those times but there is concerned that she may continue to fall as it is becoming more difficult for her to ambulate.  Currently she uses a cane but she feels like she cannot even go to the grocery store without using someone else's power mobility device because she cannot walk even to grocery store aisles without it.  A power operated scooter would not benefit her in her current home because of the thin hallways and tight corners it would be too large and she would be unable to make the turns into some of her rooms.  Currently she lives with grandchildren who live upstairs but she stays on the first floor and cannot go up the stairs because of her limited mobility.  The pain has limited her ability to wash dishes or even hold a job because she can no longer even walk short distances without significant pain and limitation.  Her balance has also become significantly weakened and she cannot currently balance on either leg individually.  Relevant past medical, surgical, family and social history reviewed and updated as indicated. Interim medical history since our last visit reviewed. Allergies and medications reviewed and updated.  Review of Systems  Constitutional: Negative for chills and fever.  Eyes: Negative for redness and visual disturbance.  Respiratory: Negative for chest tightness and shortness of breath.   Cardiovascular: Negative for chest pain and leg swelling.  Gastrointestinal: Negative for abdominal pain.  Genitourinary: Positive for frequency. Negative for difficulty urinating and dysuria.  Musculoskeletal: Positive for arthralgias and myalgias. Negative for back pain, gait problem and joint swelling.  Skin: Negative for color change and rash.    Neurological: Positive for weakness. Negative for light-headedness, numbness and headaches.  Psychiatric/Behavioral: Negative for agitation and behavioral problems.  All other systems reviewed and are negative.   Per HPI unless specifically indicated above   Allergies as of 12/31/2017   No Known Allergies     Medication List        Accurate as of 12/31/17  4:42 PM. Always use your most recent med list.          GERITOL COMPLETE PO Take 1 tablet by mouth daily.   hydrochlorothiazide 12.5 MG tablet Commonly known as:  HYDRODIURIL Take 1 tablet (12.5 mg total) by mouth daily.   Levothyroxine Sodium 137 MCG Caps Take 1 capsule (137 mcg total) by mouth daily before breakfast.   losartan 50 MG tablet Commonly known as:  COZAAR Take 1 tablet (50 mg total) by mouth daily.   phosphorus 155-852-130 MG tablet Commonly known as:  K PHOS NEUTRAL Take by mouth 2 (two) times daily.   traMADol 50 MG tablet Commonly known as:  ULTRAM Take by mouth every 6 (six) hours as needed.          Objective:    BP 138/89   Pulse (!) 105   Temp 98.2 F (36.8 C) (Oral)   Ht 4\' 11"  (1.499 m)   Wt 220 lb (99.8 kg)   BMI 44.43 kg/m   Wt Readings from Last 3 Encounters:  12/31/17 220 lb (99.8 kg)  12/20/17 220 lb (99.8 kg)  10/25/17 220 lb (99.8 kg)    Physical Exam  Constitutional: She is oriented to person, place, and time. She appears well-developed and well-nourished. No distress.  Eyes: Conjunctivae are normal.  Cardiovascular: Normal rate, regular rhythm, normal heart sounds and intact distal pulses.  No murmur heard. Pulmonary/Chest: Effort normal and breath sounds normal. No respiratory distress. She has no wheezes.  Musculoskeletal: Normal range of motion. She exhibits edema (1+ edema in bilateral lower extremities).  4 out of 5 strength in all 4 extremities.  Patient has severe bowlegged lower extremities.  She gets significant tenderness of both her knees and her hips  with range of motion.  Patient is able to fully extend both knees but cannot flex past 95 degrees.  Patient unable to balance on either of her legs individually evening while using support.  Gait: Patient has significantly contorted gait that she has to shuffle where she cannot lift her feet up very high and twists her body with each step.  Gait is very unstable and patient needs cane to support her.  Gait is less than 1 mph.  Patient has limited extension to about 160 degrees in both elbows.  Full flexion  Neurological: She is alert and oriented to person, place, and time. She displays normal reflexes. No cranial nerve deficit or sensory deficit. She exhibits abnormal muscle tone. Coordination and  gait abnormal.  Skin: Skin is warm and dry. No rash noted. She is not diaphoretic.  Psychiatric: She has a normal mood and affect. Her behavior is normal.  Nursing note and vitals reviewed.  6 out of 10 pain both of her knees today  Patient has decreased strength and decreased mobility in both of her elbows and upper arms that would limit her ability to use a manual wheelchair  Patient is currently using a cane and it is insufficient for her needs because she cannot make it to the restroom in time and cannot make it around grocery stores to do her usual shopping with the cane.  Patient is of perfectly sound mind and would be able to safely operate a power mobility device without any issues and has used power mobility devices from grocery stores at times.  Patient is very motivated and willing to use power mobility device and that is why she is coming in today  And would like one to help her become more mobile and do more things on a day-to-day basis.    Assessment & Plan:   Problem List Items Addressed This Visit      Musculoskeletal and Integument   OA (osteoarthritis) of knee - Primary   Clinical rickets   Bilateral hip joint arthritis     Genitourinary   Overactive bladder     Other    Walking difficulty due to joint disorder involving multiple sites       Follow up plan: Return in about 3 months (around 04/01/2018), or if symptoms worsen or fail to improve, for Prediabetes recheck.  Counseling provided for all of the vaccine components No orders of the defined types were placed in this encounter.   Caryl Pina, MD [facility] 12/31/2017, 4:42 PM

## 2018-02-13 ENCOUNTER — Telehealth: Payer: Self-pay | Admitting: *Deleted

## 2018-02-13 DIAGNOSIS — M25551 Pain in right hip: Secondary | ICD-10-CM

## 2018-02-13 DIAGNOSIS — M25562 Pain in left knee: Principal | ICD-10-CM

## 2018-02-13 DIAGNOSIS — M25561 Pain in right knee: Principal | ICD-10-CM

## 2018-02-13 DIAGNOSIS — M25552 Pain in left hip: Secondary | ICD-10-CM

## 2018-02-13 DIAGNOSIS — G8929 Other chronic pain: Secondary | ICD-10-CM

## 2018-02-13 NOTE — Telephone Encounter (Signed)
VM received from patient Her [employer] would like her to be referred to physical therapy before qualifying her for the [facility]

## 2018-02-13 NOTE — Telephone Encounter (Signed)
Referral ordered, patient aware 

## 2018-02-13 NOTE — Telephone Encounter (Signed)
Okay fine go ahead and do the referral to physical therapy, she is already done this previously but we can do it again.  Diagnosis pain in bilateral knees and pain in bilateral hips

## 2018-02-14 ENCOUNTER — Encounter: Payer: Self-pay | Admitting: *Deleted

## 2018-04-12 ENCOUNTER — Encounter: Payer: Self-pay | Admitting: Family

## 2018-04-12 ENCOUNTER — Other Ambulatory Visit: Payer: Self-pay | Admitting: Family Medicine

## 2018-04-12 ENCOUNTER — Ambulatory Visit (INDEPENDENT_AMBULATORY_CARE_PROVIDER_SITE_OTHER): Payer: Medicare Other | Admitting: Family

## 2018-04-12 VITALS — BP 150/93 | HR 90 | Temp 98.4°F | Ht 59.0 in | Wt 217.0 lb

## 2018-04-12 DIAGNOSIS — J301 Allergic rhinitis due to pollen: Secondary | ICD-10-CM

## 2018-04-12 DIAGNOSIS — J029 Acute pharyngitis, unspecified: Secondary | ICD-10-CM | POA: Diagnosis not present

## 2018-04-12 LAB — RAPID STREP SCREEN (MED CTR MEBANE ONLY): Strep Gp A Ag, IA W/Reflex: NEGATIVE

## 2018-04-12 LAB — CULTURE, GROUP A STREP

## 2018-04-12 MED ORDER — FLUTICASONE PROPIONATE 50 MCG/ACT NA SUSP: 2.0000 | Freq: Every day | NASAL | 6 refills | Status: AC

## 2018-04-12 MED ORDER — CETIRIZINE HCL 10 MG PO TABS: 10.0000 mg | ORAL_TABLET | Freq: Every day | ORAL | 11 refills | Status: DC

## 2018-04-12 NOTE — Patient Instructions (Signed)

## 2018-04-12 NOTE — Progress Notes (Signed)
   Subjective:    Patient ID: [name], female    DOB: 09/18/1965, 52 y.o.   MRN: [id number]  Chief Complaint  Patient presents with  . Sore Throat    Sore Throat   This is a new problem. The current episode started 1 to 4 weeks ago. There has been no fever. The pain is at a severity of 2/10. The pain is mild. Pertinent negatives include no congestion, coughing, ear discharge, ear pain, headaches, hoarse voice or trouble swallowing. She has tried nothing for the symptoms. The treatment provided no relief.      Review of Systems  HENT: Negative for congestion, ear discharge, ear pain, hoarse voice and trouble swallowing.   Respiratory: Negative for cough.   Neurological: Negative for headaches.  All other systems reviewed and are negative.      Objective:   Physical Exam  Constitutional: She is oriented to person, place, and time. She appears well-developed and well-nourished. No distress.  HENT:  Head: Normocephalic and atraumatic.  Right Ear: External ear normal.  Left Ear: External ear normal.  Nose: Mucosal edema and rhinorrhea present.  Mouth/Throat: Posterior oropharyngeal erythema present.  Eyes: Pupils are equal, round, and reactive to light.  Neck: Normal range of motion. Neck supple. No thyromegaly present.  Cardiovascular: Normal rate, regular rhythm, normal heart sounds and intact distal pulses.  No murmur heard. Pulmonary/Chest: Effort normal and breath sounds normal. No respiratory distress. She has no wheezes.  Abdominal: Soft. Bowel sounds are normal. She exhibits no distension. There is no tenderness.  Musculoskeletal: Normal range of motion. She exhibits no edema or tenderness.  Neurological: She is alert and oriented to person, place, and time. She has normal reflexes. No cranial nerve deficit.  Skin: Skin is warm and dry.  Psychiatric: She has a normal mood and affect. Her behavior is normal. Judgment and thought content normal.  Vitals  reviewed.     BP (!) 150/93   Pulse 90   Temp 98.4 F (36.9 C) (Oral)   Ht 4\' 11"  (1.499 m)   Wt 217 lb (98.4 kg)   BMI 43.83 kg/m      Assessment & Plan:  Aruna was seen today for sore throat.  Diagnoses and all orders for this visit:  Sore throat -     Rapid Strep Screen ([facility] ONLY)  Allergic rhinitis due to pollen, unspecified seasonality -     fluticasone (FLONASE) 50 MCG/ACT nasal spray; Place 2 sprays into both nostrils daily. -     cetirizine (ZYRTEC) 10 MG tablet; Take 1 tablet (10 mg total) by mouth daily.   Start Flonase and zyrtec  Call Monday if pain is worsen or does not improve - Take meds as prescribed - Use a cool mist humidifier  -Use saline nose sprays frequently -Force fluids -For any cough or congestion  Use plain Mucinex- regular strength or max strength is fine -For fever or aces or pains- take tylenol or ibuprofen. -Throat lozenges if help -New toothbrush in 3 days   Evelina Dun, FNP

## 2018-05-02 ENCOUNTER — Telehealth: Payer: Self-pay | Admitting: Family Medicine

## 2018-05-02 NOTE — Telephone Encounter (Signed)
Yes if it has been 6 months I can give her knee injections again as long as she is okay with keeping going on that.

## 2018-05-02 NOTE — Telephone Encounter (Signed)
Pt is having throbbing pain in both knees at night Pain is keeping her awake Pt would like some recommendations Please advise

## 2018-05-02 NOTE — Telephone Encounter (Signed)
Patient would like to know if you can give her knee injections again instead of going to the orthopaedic doctor.  Last ones were 6 months ago.

## 2018-05-02 NOTE — Telephone Encounter (Signed)
With the deformities that she has in her legs the only other thing that I can think to do is send her to an orthopedic, I do not have any other answers because of her history and the issues that she is had

## 2018-05-03 NOTE — Telephone Encounter (Signed)
Spoke with pt and scheduled her for MON

## 2018-05-06 ENCOUNTER — Ambulatory Visit (INDEPENDENT_AMBULATORY_CARE_PROVIDER_SITE_OTHER): Payer: Medicare Other | Admitting: Family Medicine

## 2018-05-06 ENCOUNTER — Encounter: Payer: Self-pay | Admitting: Family Medicine

## 2018-05-06 VITALS — BP 145/89 | HR 108 | Temp 97.4°F | Ht 59.0 in | Wt 217.6 lb

## 2018-05-06 DIAGNOSIS — M174 Other bilateral secondary osteoarthritis of knee: Secondary | ICD-10-CM | POA: Diagnosis not present

## 2018-05-06 MED ORDER — METHYLPREDNISOLONE ACETATE 80 MG/ML IJ SUSP
80.0000 mg | Freq: Once | INTRAMUSCULAR | Status: AC
  Administered 2018-05-06: 80 mg via INTRAMUSCULAR

## 2018-05-06 MED ORDER — METHYLPREDNISOLONE ACETATE 80 MG/ML IJ SUSP: 80.0000 mg | Freq: Once | INTRAMUSCULAR | Status: DC

## 2018-05-06 NOTE — Progress Notes (Signed)
BP (!) 145/89   Pulse (!) 108   Temp (!) 97.4 F (36.3 C) (Oral)   Ht 4\' 11"  (1.499 m)   Wt 217 lb 9.6 oz (98.7 kg)   BMI 43.95 kg/m    Subjective:    Patient ID: Rebecca Miles, female    DOB: 1965/10/24, 52 y.o.   MRN: [id number]  HPI: Rebecca Miles is a 52 y.o. female presenting on 05/06/2018 for knee injections (bilateral)   HPI Bilateral knee injections Patient has chronic bilateral knee pain secondary to arthritis which is likely secondary to her having rickets and abnormal Q angle because of that.  She is also heavier and her weight likely contributes to this as well.  She has been getting injections about every 4 months and says they still been doing great for her but usually wear off about 3-1/2 months and that is why she is coming in again today.  She denies any giving way or popping or catching or fevers or chills.  Relevant past medical, surgical, family and social history reviewed and updated as indicated. Interim medical history since our last visit reviewed. Allergies and medications reviewed and updated.  Review of Systems  Constitutional: Negative for chills and fever.  Respiratory: Negative for chest tightness and shortness of breath.   Cardiovascular: Negative for chest pain and leg swelling.  Musculoskeletal: Positive for arthralgias and gait problem.  Skin: Negative for rash.  Neurological: Negative for light-headedness and headaches.  Psychiatric/Behavioral: Negative for agitation and behavioral problems.  All other systems reviewed and are negative.   Per HPI unless specifically indicated above   Allergies as of 05/06/2018   No Known Allergies     Medication List        Accurate as of 05/06/18  4:49 PM. Always use your most recent med list.          cetirizine 10 MG tablet Commonly known as:  ZYRTEC Take 1 tablet (10 mg total) by mouth daily.   fluticasone 50 MCG/ACT nasal spray Commonly known as:  FLONASE Place 2 sprays into both  nostrils daily.   GERITOL COMPLETE PO Take 1 tablet by mouth daily.   hydrochlorothiazide 12.5 MG tablet Commonly known as:  HYDRODIURIL Take 1 tablet (12.5 mg total) by mouth daily.   levothyroxine 137 MCG tablet Commonly known as:  SYNTHROID, LEVOTHROID TAKE 1 TABLET BY MOUTH DAILY BEFORE BREAKFAST   losartan 50 MG tablet Commonly known as:  COZAAR Take 1 tablet (50 mg total) by mouth daily.   phosphorus 155-852-130 MG tablet Commonly known as:  K PHOS NEUTRAL Take by mouth 2 (two) times daily.   traMADol 50 MG tablet Commonly known as:  ULTRAM Take by mouth every 6 (six) hours as needed.          Objective:    BP (!) 145/89   Pulse (!) 108   Temp (!) 97.4 F (36.3 C) (Oral)   Ht 4\' 11"  (1.499 m)   Wt 217 lb 9.6 oz (98.7 kg)   BMI 43.95 kg/m   Wt Readings from Last 3 Encounters:  05/06/18 217 lb 9.6 oz (98.7 kg)  04/12/18 217 lb (98.4 kg)  12/31/17 220 lb (99.8 kg)    Physical Exam  Constitutional: She is oriented to person, place, and time. She appears well-developed and well-nourished. No distress.  Eyes: Conjunctivae are normal.  Musculoskeletal: Normal range of motion. She exhibits no edema.       Right knee: Tenderness found. Medial  joint line and lateral joint line tenderness noted.       Left knee: Tenderness found. Medial joint line and lateral joint line tenderness noted.  Neurological: She is alert and oriented to person, place, and time.  Skin: Skin is warm and dry. No rash noted. She is not diaphoretic.  Psychiatric: She has a normal mood and affect. Her behavior is normal.  Nursing note and vitals reviewed.   Knee injection x2: Consent form signed. Risk factors of bleeding and infection discussed with patient and patient is agreeable towards injection. Patient prepped with Betadine. Lateral approach towards injection used. Injected 80 mg of Depo-Medrol and 1 mL of 2% lidocaine. Patient tolerated procedure well and no side effects from noted.  Minimal to no bleeding. Simple bandage applied after.     Assessment & Plan:   Problem List Items Addressed This Visit      Musculoskeletal and Integument   OA (osteoarthritis) of knee - Primary   Relevant Medications   methylPREDNISolone acetate (DEPO-MEDROL) injection 80 mg (Completed)   methylPREDNISolone acetate (DEPO-MEDROL) injection 80 mg (Completed)       Follow up plan: Return if symptoms worsen or fail to improve.  Counseling provided for all of the vaccine components No orders of the defined types were placed in this encounter.   Caryl Pina, MD [facility] 05/06/2018, 4:49 PM

## 2018-05-28 ENCOUNTER — Ambulatory Visit: Payer: Medicare Other | Admitting: Family Medicine

## 2018-05-29 ENCOUNTER — Other Ambulatory Visit: Payer: Self-pay

## 2018-05-29 ENCOUNTER — Encounter: Payer: Self-pay | Admitting: Physical Therapy

## 2018-05-29 ENCOUNTER — Ambulatory Visit: Payer: BLUE CROSS/BLUE SHIELD | Attending: Family Medicine | Admitting: Physical Therapy

## 2018-05-29 DIAGNOSIS — M25561 Pain in right knee: Secondary | ICD-10-CM | POA: Diagnosis present

## 2018-05-29 DIAGNOSIS — M25562 Pain in left knee: Secondary | ICD-10-CM | POA: Insufficient documentation

## 2018-05-29 DIAGNOSIS — R262 Difficulty in walking, not elsewhere classified: Secondary | ICD-10-CM | POA: Diagnosis present

## 2018-05-29 DIAGNOSIS — R2689 Other abnormalities of gait and mobility: Secondary | ICD-10-CM | POA: Diagnosis present

## 2018-05-29 DIAGNOSIS — G8929 Other chronic pain: Secondary | ICD-10-CM | POA: Diagnosis present

## 2018-05-29 NOTE — Therapy (Signed)
[facility] [place], [place], [address] Phone: [phone]   Fax:  [phone]  Physical Therapy Evaluation  Patient Details  Name: Rebecca Miles MRN: [id number] Date of Birth: [birth year] Referring Provider: Caryl Pina, MD   Encounter Date: 05/29/2018  PT End of Session - 05/29/18 1140    Visit Number  1    Number of Visits  12    Date for PT Re-Evaluation  07/17/18    PT Start Time  0815    PT Stop Time  0916    PT Time Calculation (min)  61 min    Activity Tolerance  Patient tolerated treatment well    Behavior During Therapy  [facility] for tasks assessed/performed       Past Medical History:  Diagnosis Date  . Allergy   . Arthritis   . Hypertension   . Hypothyroidism   . Osteoarthritis    ankles and joints  . Rickets     Past Surgical History:  Procedure Laterality Date  . CESAREAN SECTION    . FEMUR IM NAIL  09/26/2012   Procedure: INTRAMEDULLARY (IM) RETROGRADE FEMORAL NAILING;  Surgeon: Gearlean Alf, MD;  Location: [facility];  Service: Orthopedics;  Laterality: Right;  . LEG SURGERY     bilateral at age 43   . TIBIA OSTEOTOMY  09/26/2012   Procedure: TIBIAL OSTEOTOMY;  Surgeon: Gearlean Alf, MD;  Location: [facility];  Service: Orthopedics;  Laterality: Right;  RIGHT FEMORAL CORRECTIVE OSTEOMITY WITH RETROGRADE IM FEMORAL NAIL  . TUBAL LIGATION    . TUBAL LIGATION      There were no vitals filed for this visit.   Subjective Assessment - 05/29/18 1218    Subjective  Patient arrives to physical therapy with reports of bilateral chronic knee pain R>L that began to get progressively worse since 2012. Patient states she has a history of rickets as a child and has had tibial osteotomies as a young adult. She stated her most recent surgery was a right femur and tibial osteotomy in 09/26/12. Patient states she has been getting cortisone injections in bilateral knees which has helped with pain. Patient's last  injection was September 2019. Patient states she can ambulate with SPC for less than 30 minutes but for long distance ambulation, she uses electric scooters at grocery stores. Patient stated she has limited her outings especially if she knows she must walk long distances. Patient's goals are to decrease pain, improve movement, improve strength, and to improve balance to reduce falls and walk without a cane.     Pertinent History  Rickets as a child, HTN, Tibial osteotomy 09/26/12, Femur IM nail 09/26/12    Limitations  Walking;Standing;House hold activities    How long can you sit comfortably?  unlimited    How long can you stand comfortably?  15-20 minutes    How long can you walk comfortably?  less than 30 minutes with cane    Diagnostic tests  MRI X-ray, see imaging tab    Patient Stated Goals  walk better without cane, improve balance, ride a bike    Currently in Pain?  Yes    Pain Score  4     Pain Location  Knee    Pain Orientation  Right;Left    Pain Descriptors / Indicators  Sore;Aching;Constant    Pain Type  Chronic pain    Pain Onset  More than a month ago    Pain Frequency  Constant  Aggravating Factors   standing and walking for long distances    Pain Relieving Factors  staying off legs, tylenol, heat pads, warm bath    Effect of Pain on Daily Activities  limited with outings and activities around the home         [facility] PT Assessment - 05/29/18 0001      Assessment   Medical Diagnosis  Chronic pain of both knee, hip pain, bilateral    Referring Provider  Caryl Pina, MD    Onset Date/Surgical Date  --   January 2012   Next MD Visit  October 2019    Prior Therapy  No      Precautions   Precautions  Fall      Restrictions   Weight Bearing Restrictions  No      Balance Screen   Has the patient fallen in the past 6 months  Yes    How many times?  2-3    Has the patient had a decrease in activity level because of a fear of falling?   Yes    Is the patient  reluctant to leave their home because of a fear of falling?   No      Home Environment   Living Environment  Private residence    Living Arrangements  Children    Home Access  Level entry    [place] - single point;Shower seat;Toilet riser;Grab bars - tub/shower      Prior Function   Level of Independence  Independent with household mobility with device    Vocation  On disability      Posture/Postural Control   Posture Comments  moderate genu varum in stance.      ROM / Strength   AROM / PROM / Strength  AROM;Strength      AROM   Overall AROM   Due to pain    AROM Assessment Site  Knee    Right/Left Knee  Right;Left    Right Knee Extension  0    Right Knee Flexion  75    Left Knee Extension  0    Left Knee Flexion  85      Strength   Overall Strength  Deficits;Due to pain    Strength Assessment Site  Hip;Knee    Right/Left Hip  Right;Left    Right Hip Flexion  3-/5    Right Hip Extension  2+/5    Right Hip ABduction  3/5    Left Hip Flexion  3+/5    Left Hip Extension  2+/5    Left Hip ABduction  3+/5    Right/Left Knee  Right;Left    Right Knee Flexion  3+/5    Right Knee Extension  4-/5    Left Knee Flexion  3+/5    Left Knee Extension  4-/5      Palpation   Patella mobility  hypomobility of bilateral patellas with pain     Palpation comment  Very tender to palpation to right patella and posterior knee, tenderness to palpation in right hamstring in prone      Transfers   Five time sit to stand comments   24 seconds    Comments  Independent with all transfers with increased time to perform activity      Ambulation/Gait   Ambulation/Gait  Yes    Ambulation/Gait Assistance  6: Modified independent (Device/Increase time)    Assistive device  Straight cane    Gait Pattern  Step-through  pattern;Decreased stride length;Decreased stance time - right;Antalgic;Trendelenburg;Lateral trunk lean to right;Wide base of support;Decreased hip/knee flexion -  right;Decreased hip/knee flexion - left   L genu varum during stance, bilateral intoeing                Objective measurements completed on examination: See above findings.              PT Education - 05/29/18 1220    Education Details  marching, seated hip abduction, seated hamstring curl    Person(s) Educated  Patient    Methods  Explanation;Demonstration;Handout    Comprehension  Verbalized understanding       PT Short Term Goals - 05/29/18 1228      PT SHORT TERM GOAL #1   Title  STG=LTG        PT Long Term Goals - 05/29/18 1228      PT LONG TERM GOAL #1   Title  Patient will be independent with HEP    Time  6    Period  Weeks    Status  New      PT LONG TERM GOAL #2   Title  Patient will improve 5x sit to stand test to 15 seconds or less to decrease risk of falls and improve functional LE strength.    Time  6    Period  Weeks    Status  New      PT LONG TERM GOAL #3   Title  Patient will report 4/10 pain or less in bilateral knees with ADLs.     Time  4    Period  Weeks    Status  New             Plan - 05/29/18 1224    Clinical Impression Statement  Patient is a 52 year old female who presents to physical therapy with chronic bilateral knee pain, abnormal gait, and decreased bilateral LE strength. Patient noted with decreased knee AROM bilaterally. Patient's 5x sit to stand score of 24 seconds indicated an increase risk for falls as well as decreased functional LE strength. Patient tender to touch along right anterior knee as well as posterior knee. Patient ambulates with an antalgic gait pattern with a straight cane and decreased stride length, severe left genu varum during stance phase, and bilateral in toeing. Patient would benefit from skilled physical therapy to address deficits and address patient's goals.     History and Personal Factors relevant to plan of care:  history of rickets, HTN, history of tibial osteotomies and femur IM  nail    Clinical Presentation  Evolving    Clinical Decision Making  Moderate    Rehab Potential  Fair    PT Frequency  2x / week    PT Duration  6 weeks    PT Treatment/Interventions  ADLs/Self Care Home Management;Electrical Stimulation;Cryotherapy;Ultrasound;Moist Heat;Gait training;Stair training;Neuromuscular re-education;Passive range of motion;Manual techniques;Patient/family education;Therapeutic activities;Therapeutic exercise;Vasopneumatic Device    PT Next Visit Plan  Please perform BERG balance scale and 6 minute walk test, bike or nustep, LE strengthening, Modalities PRN for pain    PT Home Exercise Plan  see patient education    Consulted and Agree with Plan of Care  Patient       Patient will benefit from skilled therapeutic intervention in order to improve the following deficits and impairments:  Abnormal gait, Pain, Postural dysfunction, Decreased activity tolerance, Decreased endurance, Decreased range of motion, Decreased strength, Decreased balance, Difficulty walking  Visit Diagnosis: Chronic pain  of right knee - Plan: PT plan of care cert/re-cert  Chronic pain of left knee - Plan: PT plan of care cert/re-cert  Difficulty in walking, not elsewhere classified - Plan: PT plan of care cert/re-cert  Other abnormalities of gait and mobility - Plan: PT plan of care cert/re-cert     Problem List Patient Active Problem List   Diagnosis Date Noted  . Clinical rickets 12/31/2017  . Overactive bladder 12/31/2017  . Walking difficulty due to joint disorder involving multiple sites 12/31/2017  . Bilateral hip joint arthritis 12/31/2017  . Hypothyroidism 05/21/2017  . Hypertension 05/21/2017  . Prediabetes 05/21/2017  . OA (osteoarthritis) of knee 09/26/2012   Gabriela Eves, PT, DPT 05/29/2018, 12:49 PM  [facility] [place], [place], [address] Phone: [phone]   Fax:  [phone]  Name: Rebecca Miles MRN: [id number] Date of Birth: April 18, 1966

## 2018-05-31 ENCOUNTER — Encounter: Payer: Medicare Other | Admitting: Physical Therapy

## 2018-06-02 ENCOUNTER — Other Ambulatory Visit: Payer: Self-pay | Admitting: Family

## 2018-06-02 DIAGNOSIS — J301 Allergic rhinitis due to pollen: Secondary | ICD-10-CM

## 2018-06-03 ENCOUNTER — Encounter: Payer: Medicare Other | Admitting: *Deleted

## 2018-06-05 ENCOUNTER — Ambulatory Visit: Payer: BLUE CROSS/BLUE SHIELD | Admitting: Physical Therapy

## 2018-06-05 ENCOUNTER — Encounter: Payer: Self-pay | Admitting: Physical Therapy

## 2018-06-05 DIAGNOSIS — G8929 Other chronic pain: Secondary | ICD-10-CM

## 2018-06-05 DIAGNOSIS — M25561 Pain in right knee: Secondary | ICD-10-CM | POA: Diagnosis not present

## 2018-06-05 DIAGNOSIS — M25562 Pain in left knee: Secondary | ICD-10-CM

## 2018-06-05 DIAGNOSIS — R2689 Other abnormalities of gait and mobility: Secondary | ICD-10-CM

## 2018-06-05 DIAGNOSIS — R262 Difficulty in walking, not elsewhere classified: Secondary | ICD-10-CM

## 2018-06-05 NOTE — Therapy (Signed)
[facility] [place], [place], [address] Phone: [phone]   Fax:  [phone]  Physical Therapy Treatment  Patient Details  Name: Rebecca Miles MRN: [id number] Date of Birth: December 04, 1965 Referring Provider: Caryl Pina, MD   Encounter Date: 06/05/2018  PT End of Session - 06/05/18 1402    Visit Number  2    Number of Visits  12    Date for PT Re-Evaluation  07/17/18    PT Start Time  1316    PT Stop Time  1359    PT Time Calculation (min)  43 min    Activity Tolerance  Patient tolerated treatment well    Behavior During Therapy  [facility] for tasks assessed/performed       Past Medical History:  Diagnosis Date  . Allergy   . Arthritis   . Hypertension   . Hypothyroidism   . Osteoarthritis    ankles and joints  . Rickets     Past Surgical History:  Procedure Laterality Date  . CESAREAN SECTION    . FEMUR IM NAIL  09/26/2012   Procedure: INTRAMEDULLARY (IM) RETROGRADE FEMORAL NAILING;  Surgeon: Gearlean Alf, MD;  Location: [facility];  Service: Orthopedics;  Laterality: Right;  . LEG SURGERY     bilateral at age 59   . TIBIA OSTEOTOMY  09/26/2012   Procedure: TIBIAL OSTEOTOMY;  Surgeon: Gearlean Alf, MD;  Location: [facility];  Service: Orthopedics;  Laterality: Right;  RIGHT FEMORAL CORRECTIVE OSTEOMITY WITH RETROGRADE IM FEMORAL NAIL  . TUBAL LIGATION    . TUBAL LIGATION      There were no vitals filed for this visit.  Subjective Assessment - 06/05/18 1324    Subjective  Patient arrived with stiffness today    Pertinent History  Rickets as a child, HTN, Tibial osteotomy 09/26/12, Femur IM nail 09/26/12    Limitations  Walking;Standing;House hold activities    How long can you sit comfortably?  unlimited    How long can you stand comfortably?  15-20 minutes    How long can you walk comfortably?  less than 30 minutes with cane    Diagnostic tests  MRI X-ray, see imaging tab    Patient Stated Goals  walk  better without cane, improve balance, ride a bike    Currently in Pain?  Yes    Pain Score  4     Pain Location  Knee    Pain Orientation  Right;Left    Pain Descriptors / Indicators  Tightness;Discomfort    Pain Type  Chronic pain    Pain Onset  More than a month ago    Pain Frequency  Constant    Aggravating Factors   prolong standing or prolong walking    Pain Relieving Factors  at rest, heat and medication         [facility] PT Assessment - 06/05/18 0001      Standardized Balance Assessment   Standardized Balance Assessment  Berg Balance Test      Berg Balance Test   Sit to Stand  Able to stand using hands after several tries    Standing Unsupported  Needs several tries to stand 30 seconds unsupported    Sitting with Back Unsupported but Feet Supported on Floor or Stool  Able to sit 30 seconds    Stand to Sit  Uses backs of legs against chair to control descent    Transfers  Able to transfer with verbal cueing and /  or supervision    Standing Unsupported with Eyes Closed  Able to stand 3 seconds    Standing Ubsupported with Feet Together  Able to place feet together independently but unable to hold for 30 seconds    From Standing, Reach Forward with Outstretched Arm  Can reach forward >12 cm safely (5")    From Standing Position, Pick up Object from Floor  Unable to pick up shoe, but reaches 2-5 cm (1-2") from shoe and balances independently    From Standing Position, Turn to Look Behind Over each Shoulder  Looks behind one side only/other side shows less weight shift    Turn 360 Degrees  Able to turn 360 degrees safely but slowly    Standing Unsupported, Alternately Place Feet on Step/Stool  Able to complete >2 steps/needs minimal assist    Standing Unsupported, One Foot in Front  Needs help to step but can hold 15 seconds    Standing on One Leg  Unable to try or needs assist to prevent fall    Total Score  25                   [facility] Adult PT Treatment/Exercise -  06/05/18 0001      Exercises   Exercises  Knee/Hip      Knee/Hip Exercises: Aerobic   Nustep  L3 x1min UE/LE      Knee/Hip Exercises: Seated   Long Arc Quad  Strengthening;Both;3 sets;10 reps;Weights    Long Arc Quad Weight  2 lbs.    Ball Squeeze  5sex x30    Marching  Strengthening;Both;3 sets;10 reps      Knee/Hip Exercises: Supine   Short Arc Quad Sets  Strengthening;Both;3 sets;10 reps    Short Arc Quad Sets Limitations  2#    Other Supine Knee/Hip Exercises  clamshell with red t-band x30          Balance Exercises - 06/05/18 1358      Balance Exercises: Standing   Standing Eyes Opened  Narrow base of support (BOS);Wide (BOA);Solid surface;4 reps;2 reps;10 secs    Standing Eyes Closed  Wide (BOA);2 reps;10 secs    Tandem Stance  Eyes open;2 reps    SLS  Limitations    Step Ups  4 inch;Forward;UE support 2;UE support 1   2x10   Partial Tandem Stance  Eyes open;2 reps    Sit to Stand Time  x5          PT Short Term Goals - 05/29/18 1228      PT SHORT TERM GOAL #1   Title  STG=LTG        PT Long Term Goals - 05/29/18 1228      PT LONG TERM GOAL #1   Title  Patient will be independent with HEP    Time  6    Period  Weeks    Status  New      PT LONG TERM GOAL #2   Title  Patient will improve 5x sit to stand test to 15 seconds or less to decrease risk of falls and improve functional LE strength.    Time  6    Period  Weeks    Status  New      PT LONG TERM GOAL #3   Title  Patient will report 4/10 pain or less in bilateral knees with ADLs.     Time  4    Period  Weeks    Status  New  Plan - 06/05/18 1424    Clinical Impression Statement  Patient tolerated treatment well today. Patient able to progress with all exercises today for UE/LE and balance. Patient has weakness and difficulty with balance acyivities. Today BERG was 25/56. Patient goals ongoing at this time.     Rehab Potential  Fair    PT Frequency  2x / week    PT  Duration  6 weeks    PT Treatment/Interventions  ADLs/Self Care Home Management;Electrical Stimulation;Cryotherapy;Ultrasound;Moist Heat;Gait training;Stair training;Neuromuscular re-education;Passive range of motion;Manual techniques;Patient/family education;Therapeutic activities;Therapeutic exercise;Vasopneumatic Device    PT Next Visit Plan  Please perform 6 minute walk test and cont with bike or nustep, LE strengthening, Modalities PRN for pain    Consulted and Agree with Plan of Care  Patient       Patient will benefit from skilled therapeutic intervention in order to improve the following deficits and impairments:  Abnormal gait, Pain, Postural dysfunction, Decreased activity tolerance, Decreased endurance, Decreased range of motion, Decreased strength, Decreased balance, Difficulty walking  Visit Diagnosis: Chronic pain of right knee  Chronic pain of left knee  Difficulty in walking, not elsewhere classified  Other abnormalities of gait and mobility     Problem List Patient Active Problem List   Diagnosis Date Noted  . Clinical rickets 12/31/2017  . Overactive bladder 12/31/2017  . Walking difficulty due to joint disorder involving multiple sites 12/31/2017  . Bilateral hip joint arthritis 12/31/2017  . Hypothyroidism 05/21/2017  . Hypertension 05/21/2017  . Prediabetes 05/21/2017  . OA (osteoarthritis) of knee 09/26/2012    Abuk Selleck P, PTA 06/05/2018, 2:27 PM  [facility] [place], [place], [address] Phone: [phone]   Fax:  [phone]  Name: Rebecca Miles MRN: [id number] Date of Birth: 06/28/1966

## 2018-06-12 ENCOUNTER — Ambulatory Visit: Payer: Managed Care, Other (non HMO) | Attending: Family Medicine | Admitting: Physical Therapy

## 2018-06-12 ENCOUNTER — Encounter: Payer: Self-pay | Admitting: Physical Therapy

## 2018-06-12 DIAGNOSIS — R262 Difficulty in walking, not elsewhere classified: Secondary | ICD-10-CM | POA: Diagnosis not present

## 2018-06-12 DIAGNOSIS — R2689 Other abnormalities of gait and mobility: Secondary | ICD-10-CM | POA: Insufficient documentation

## 2018-06-12 DIAGNOSIS — G8929 Other chronic pain: Secondary | ICD-10-CM | POA: Diagnosis not present

## 2018-06-12 DIAGNOSIS — M25561 Pain in right knee: Secondary | ICD-10-CM | POA: Diagnosis not present

## 2018-06-12 DIAGNOSIS — M25562 Pain in left knee: Secondary | ICD-10-CM | POA: Insufficient documentation

## 2018-06-12 NOTE — Therapy (Signed)
[facility] [place], [place], [address] Phone: [phone]   Fax:  [phone]  Physical Therapy Treatment  Patient Details  Name: Rebecca Miles MRN: [id number] Date of Birth: 05/06/1966 Referring Provider (PT): Caryl Pina, MD   Encounter Date: 06/12/2018  PT End of Session - 06/12/18 1351    Visit Number  3    Number of Visits  12    Date for PT Re-Evaluation  07/17/18    PT Start Time  6568    PT Stop Time  1431    PT Time Calculation (min)  43 min    Activity Tolerance  Patient tolerated treatment well    Behavior During Therapy  [facility] for tasks assessed/performed       Past Medical History:  Diagnosis Date  . Allergy   . Arthritis   . Hypertension   . Hypothyroidism   . Osteoarthritis    ankles and joints  . Rickets     Past Surgical History:  Procedure Laterality Date  . CESAREAN SECTION    . FEMUR IM NAIL  09/26/2012   Procedure: INTRAMEDULLARY (IM) RETROGRADE FEMORAL NAILING;  Surgeon: Gearlean Alf, MD;  Location: [facility];  Service: Orthopedics;  Laterality: Right;  . LEG SURGERY     bilateral at age 69   . TIBIA OSTEOTOMY  09/26/2012   Procedure: TIBIAL OSTEOTOMY;  Surgeon: Gearlean Alf, MD;  Location: [facility];  Service: Orthopedics;  Laterality: Right;  RIGHT FEMORAL CORRECTIVE OSTEOMITY WITH RETROGRADE IM FEMORAL NAIL  . TUBAL LIGATION    . TUBAL LIGATION      There were no vitals filed for this visit.  Subjective Assessment - 06/12/18 1350    Subjective  Reports only stiffness upon arrival.    Pertinent History  Rickets as a child, HTN, Tibial osteotomy 09/26/12, Femur IM nail 09/26/12    Limitations  Walking;Standing;House hold activities    How long can you sit comfortably?  unlimited    How long can you stand comfortably?  15-20 minutes    How long can you walk comfortably?  less than 30 minutes with cane    Diagnostic tests  MRI X-ray, see imaging tab    Patient Stated Goals   walk better without cane, improve balance, ride a bike    Currently in Pain?  No/denies         [facility] PT Assessment - 06/12/18 0001      Assessment   Medical Diagnosis  Chronic pain of both knee, hip pain, bilateral    Next MD Visit  October 2019    Prior Therapy  No      Precautions   Precautions  Fall      Restrictions   Weight Bearing Restrictions  No                   [facility] Adult PT Treatment/Exercise - 06/12/18 0001      Exercises   Exercises  Knee/Hip      Knee/Hip Exercises: Aerobic   Nustep  L3 x17 min      Knee/Hip Exercises: Standing   Heel Raises  Both;20 reps    Heel Raises Limitations  B x20 reps      Knee/Hip Exercises: Seated   Long Arc Quad  Strengthening;Both;3 sets;10 reps;Weights    Long Arc Quad Weight  3 lbs.    Ball Squeeze  5 sec x20 reps    Clamshell with TheraBand  Red  x20 reps   Marching  AROM;Both;Other (comment)   x30 reps   Hamstring Curl  Strengthening;Both;3 sets;10 reps;Limitations    Hamstring Limitations  red theraband    Sit to Sand  20 reps;without UE support               PT Short Term Goals - 05/29/18 1228      PT SHORT TERM GOAL #1   Title  STG=LTG        PT Long Term Goals - 05/29/18 1228      PT LONG TERM GOAL #1   Title  Patient will be independent with HEP    Time  6    Period  Weeks    Status  New      PT LONG TERM GOAL #2   Title  Patient will improve 5x sit to stand test to 15 seconds or less to decrease risk of falls and improve functional LE strength.    Time  6    Period  Weeks    Status  New      PT LONG TERM GOAL #3   Title  Patient will report 4/10 pain or less in bilateral knees with ADLs.     Time  4    Period  Weeks    Status  New            Plan - 06/12/18 1353    Clinical Impression Statement  Patient tolerated today's treatment well with no complaints of pain upon arrival. Patient guided through mostly seated exercises due to standing pain. Demo and VCs  provided throughout treatment to emphasize correct exercise technique. Greater weakness in RLE noted today along with greater genu varum in LLE. Patient denied any pain or fatigue following end of treatment.    Rehab Potential  Fair    PT Frequency  2x / week    PT Duration  6 weeks    PT Treatment/Interventions  ADLs/Self Care Home Management;Electrical Stimulation;Cryotherapy;Ultrasound;Moist Heat;Gait training;Stair training;Neuromuscular re-education;Passive range of motion;Manual techniques;Patient/family education;Therapeutic activities;Therapeutic exercise;Vasopneumatic Device    PT Next Visit Plan  Please perform 6 minute walk test and cont with bike or nustep, LE strengthening, Modalities PRN for pain    PT Home Exercise Plan  see patient education    Consulted and Agree with Plan of Care  Patient       Patient will benefit from skilled therapeutic intervention in order to improve the following deficits and impairments:  Abnormal gait, Pain, Postural dysfunction, Decreased activity tolerance, Decreased endurance, Decreased range of motion, Decreased strength, Decreased balance, Difficulty walking  Visit Diagnosis: Chronic pain of right knee  Chronic pain of left knee  Difficulty in walking, not elsewhere classified  Other abnormalities of gait and mobility     Problem List Patient Active Problem List   Diagnosis Date Noted  . Clinical rickets 12/31/2017  . Overactive bladder 12/31/2017  . Walking difficulty due to joint disorder involving multiple sites 12/31/2017  . Bilateral hip joint arthritis 12/31/2017  . Hypothyroidism 05/21/2017  . Hypertension 05/21/2017  . Prediabetes 05/21/2017  . OA (osteoarthritis) of knee 09/26/2012    Standley Brooking, PTA 06/12/2018, 3:38 PM  [facility] [address] [place], [place], [address] Phone: [phone]   Fax:  [phone]  Name: Rebecca Miles MRN: [id number] Date of  Birth: 1966/05/21

## 2018-06-13 ENCOUNTER — Encounter: Payer: Medicare Other | Admitting: Physical Therapy

## 2018-06-19 ENCOUNTER — Encounter: Payer: Medicare Other | Admitting: Physical Therapy

## 2018-06-19 ENCOUNTER — Encounter: Payer: Self-pay | Admitting: Family Medicine

## 2018-06-19 ENCOUNTER — Ambulatory Visit (INDEPENDENT_AMBULATORY_CARE_PROVIDER_SITE_OTHER): Payer: Medicare Other | Admitting: Family Medicine

## 2018-06-19 VITALS — BP 125/81 | HR 85 | Temp 98.1°F | Ht 59.0 in | Wt 213.6 lb

## 2018-06-19 DIAGNOSIS — E550 Rickets, active: Secondary | ICD-10-CM | POA: Diagnosis not present

## 2018-06-19 DIAGNOSIS — Z23 Encounter for immunization: Secondary | ICD-10-CM

## 2018-06-19 DIAGNOSIS — R262 Difficulty in walking, not elsewhere classified: Secondary | ICD-10-CM

## 2018-06-19 DIAGNOSIS — M174 Other bilateral secondary osteoarthritis of knee: Secondary | ICD-10-CM

## 2018-06-19 NOTE — Progress Notes (Addendum)
BP 125/81   Pulse 85   Temp 98.1 F (36.7 C) (Oral)   Ht 4\' 11"  (1.499 m)   Wt 213 lb 9.6 oz (96.9 kg)   BMI 43.14 kg/m    Subjective:    Patient ID: Rebecca Miles, female    DOB: Jul 20, 1966, 52 y.o.   MRN: [id number]  HPI: Rebecca Miles is a 52 y.o. female presenting on 06/19/2018 for hoover round form   HPI Chronic rickets and b/l hip and knee pain that has been going on for some time but has recently started to affect her even more over the past year.  She has been having increasingly difficult problems with walking and getting around and even going to the grocery store or even getting around her house.  She has been continued to try to work with physical therapy and trying to get strength back but is been challenging for her.  Because of her lack of mobility she has been decreasing in strength in both upper extremities and lower extremities and her pain in both her knees and both of her hips has really been limiting her because of her history of rickets and developing arthritis.  Because of her history of rickets she is not a great candidate for joint replacement.  Patient comes in to do a face-to-face for power wheelchair/Hoveround.  Patient has chronic history of rickets since she was a child and has had surgeries on her right femur to try and correct the deformity but she is to remain deformed and is very bowlegged.  Because of her bowlegged it has limited her mobility throughout her life and has led to significant osteo-arthritis in both knees and her hips and also led to her being obese because she has not been able to be mobile because of the pain.  She is currently using a cane for mobility and she cannot walk even short distances of 5 feet with out the cane and with a cane she cannot walk more than a half a block or 2 grocery store house which is about 100 feet.  She has to stop frequently from the pain even with a cane.  Frequently at the store she is currently using their  power mobility devices unless there is one not available which is why she wants her own device.  She does not get out of her house frequently because of her limited mobility with a cane.  The pain has been worsening because of her arthritis and is gradually been getting harder over the past few years because of bilateral knee pain and hip pain for her to be able to ambulate.  She is currently using tramadol and ibuprofen and Tylenol for the pain which gives her some function but still very limited.  She has done a course of physical therapy to help with this but her pain still very limits her.  Her pace of ambulation with a cane is less than 1 mph currently and this can cause a significant issue with her overactive bladder and being able to make it to the restroom even her own house safely without falling.  She has had 2 falls in the past year where she stand her foot on a small step and fell onto her side or front.  She did not sustain any fractures at this time but there is concerned that she may continue to fall as it has become increasingly difficult for her to ambulate even around her house.  Currently she  uses a cane but she feels like she cannot even go to the grocery store without using somebody else's power mobility device because she cannot walk even to the grocery store aisles without it.  A power mobility device would greatly benefit her in getting to the bathroom to toilet and bathe and to get to the kitchen to help her prepare meals.  Currently she lives with grandchildren who live upstairs but she stays on the first floor and cannot go upstairs because of her limited mobility.  The pain has limited her ability to wash dishes or even hold a job because she can no longer walk even short distances without significant pain and limitation.  Her balance has also become significantly weakened and she cannot currently balance on either leg individually.  Relevant past medical, surgical, family and social  history reviewed and updated as indicated. Interim medical history since our last visit reviewed. Allergies and medications reviewed and updated.  Review of Systems  Constitutional: Negative for chills and fever.  Eyes: Negative for visual disturbance.  Respiratory: Negative for chest tightness and shortness of breath.   Cardiovascular: Negative for chest pain and leg swelling.  Musculoskeletal: Positive for arthralgias. Negative for back pain, gait problem, joint swelling and myalgias.  Skin: Negative for color change and rash.  Neurological: Positive for weakness. Negative for light-headedness, numbness and headaches.  Psychiatric/Behavioral: Negative for agitation and behavioral problems.  All other systems reviewed and are negative.   Per HPI unless specifically indicated above   Allergies as of 06/19/2018   No Known Allergies     Medication List        Accurate as of 06/19/18 11:35 AM. Always use your most recent med list.          cetirizine 10 MG tablet Commonly known as:  ZYRTEC Take 1 tablet (10 mg total) by mouth daily.   fluticasone 50 MCG/ACT nasal spray Commonly known as:  FLONASE Place 2 sprays into both nostrils daily.   GERITOL COMPLETE PO Take 1 tablet by mouth daily.   hydrochlorothiazide 12.5 MG tablet Commonly known as:  HYDRODIURIL Take 1 tablet (12.5 mg total) by mouth daily.   levothyroxine 137 MCG tablet Commonly known as:  SYNTHROID, LEVOTHROID TAKE 1 TABLET BY MOUTH DAILY BEFORE BREAKFAST   losartan 50 MG tablet Commonly known as:  COZAAR Take 1 tablet (50 mg total) by mouth daily.   phosphorus 155-852-130 MG tablet Commonly known as:  K PHOS NEUTRAL Take by mouth 2 (two) times daily.   traMADol 50 MG tablet Commonly known as:  ULTRAM Take by mouth every 6 (six) hours as needed.          Objective:    BP 125/81   Pulse 85   Temp 98.1 F (36.7 C) (Oral)   Ht 4\' 11"  (1.499 m)   Wt 213 lb 9.6 oz (96.9 kg)   BMI 43.14 kg/m    Wt Readings from Last 3 Encounters:  06/19/18 213 lb 9.6 oz (96.9 kg)  05/06/18 217 lb 9.6 oz (98.7 kg)  04/12/18 217 lb (98.4 kg)    Physical Exam  Constitutional: She is oriented to person, place, and time. She appears well-developed and well-nourished. No distress.  Eyes: Conjunctivae are normal.  Cardiovascular: Normal rate, regular rhythm, normal heart sounds and intact distal pulses.  No murmur heard. Pulmonary/Chest: Effort normal and breath sounds normal. No respiratory distress. She has no wheezes.  Musculoskeletal: Normal range of motion. She exhibits no edema.  Neurological: She is alert and oriented to person, place, and time. She displays normal reflexes. No sensory deficit. She exhibits abnormal muscle tone (4 out of 5 strength in all 4 extremities). Coordination normal.  Skin: Skin is warm and dry. No rash noted. She is not diaphoretic.  Psychiatric: She has a normal mood and affect. Her behavior is normal.  Nursing note and vitals reviewed.  Gait: Patient has significant limited gait but she has to shuffle where she cannot lift her feet up very high and twist her body with each step.  Her gait is very unstable and patient needs a cane to support her even in short distances.  Gait is less than 1 mph.     Assessment & Plan:   Problem List Items Addressed This Visit      Musculoskeletal and Integument   OA (osteoarthritis) of knee   Clinical rickets     Other   Walking difficulty due to joint disorder involving multiple sites - Primary    Other Visit Diagnoses    Need for immunization against influenza       Relevant Orders   Flu Vaccine QUAD 36+ mos IM (Completed)       Follow up plan: Return if symptoms worsen or fail to improve.  Counseling provided for all of the vaccine components No orders of the defined types were placed in this encounter.   Caryl Pina, MD [facility] 06/19/2018, 11:35 AM

## 2018-06-20 ENCOUNTER — Encounter: Payer: Self-pay | Admitting: Physical Therapy

## 2018-06-20 ENCOUNTER — Ambulatory Visit: Payer: Managed Care, Other (non HMO) | Admitting: Physical Therapy

## 2018-06-20 DIAGNOSIS — G8929 Other chronic pain: Secondary | ICD-10-CM

## 2018-06-20 DIAGNOSIS — R262 Difficulty in walking, not elsewhere classified: Secondary | ICD-10-CM

## 2018-06-20 DIAGNOSIS — R2689 Other abnormalities of gait and mobility: Secondary | ICD-10-CM | POA: Diagnosis not present

## 2018-06-20 DIAGNOSIS — M25562 Pain in left knee: Secondary | ICD-10-CM

## 2018-06-20 DIAGNOSIS — M25561 Pain in right knee: Principal | ICD-10-CM

## 2018-06-20 NOTE — Therapy (Addendum)
[facility] [place], [place], [address] Phone: [phone]   Fax:  [phone]  Physical Therapy Treatment  Patient Details  Name: Rebecca Miles MRN: [id number] Date of Birth: 03/12/1966 Referring Provider (PT): Caryl Pina, MD   Encounter Date: 06/20/2018  PT End of Session - 06/20/18 1347    Visit Number  4    Number of Visits  12    Date for PT Re-Evaluation  07/17/18    PT Start Time  6712    PT Stop Time  1430    PT Time Calculation (min)  45 min    Activity Tolerance  Patient tolerated treatment well    Behavior During Therapy  [facility] for tasks assessed/performed       Past Medical History:  Diagnosis Date  . Allergy   . Arthritis   . Hypertension   . Hypothyroidism   . Osteoarthritis    ankles and joints  . Rickets     Past Surgical History:  Procedure Laterality Date  . CESAREAN SECTION    . FEMUR IM NAIL  09/26/2012   Procedure: INTRAMEDULLARY (IM) RETROGRADE FEMORAL NAILING;  Surgeon: Gearlean Alf, MD;  Location: [facility];  Service: Orthopedics;  Laterality: Right;  . LEG SURGERY     bilateral at age 30   . TIBIA OSTEOTOMY  09/26/2012   Procedure: TIBIAL OSTEOTOMY;  Surgeon: Gearlean Alf, MD;  Location: [facility];  Service: Orthopedics;  Laterality: Right;  RIGHT FEMORAL CORRECTIVE OSTEOMITY WITH RETROGRADE IM FEMORAL NAIL  . TUBAL LIGATION    . TUBAL LIGATION      There were no vitals filed for this visit.  Subjective Assessment - 06/20/18 1346    Subjective  Reports that she is a little stiff today but the MD said it was a good idea for her to keep coming to PT.    Pertinent History  Rickets as a child, HTN, Tibial osteotomy 09/26/12, Femur IM nail 09/26/12    Limitations  Walking;Standing;House hold activities    How long can you sit comfortably?  unlimited    How long can you stand comfortably?  15-20 minutes    How long can you walk comfortably?  less than 30 minutes with cane     Diagnostic tests  MRI X-ray, see imaging tab    Patient Stated Goals  walk better without cane, improve balance, ride a bike    Currently in Pain?  Yes    Pain Score  4     Pain Location  Knee    Pain Orientation  Right    Pain Descriptors / Indicators  Discomfort    Pain Type  Chronic pain    Pain Onset  More than a month ago         [facility] PT Assessment - 06/20/18 0001      Assessment   Medical Diagnosis  Chronic pain of both knee, hip pain, bilateral    Next MD Visit  October 2019    Prior Therapy  No      Precautions   Precautions  Fall      Restrictions   Weight Bearing Restrictions  No                   [facility] Adult PT Treatment/Exercise - 06/20/18 0001      Exercises   Exercises  Knee/Hip      Knee/Hip Exercises: Aerobic   Nustep  L5 x15 min  Knee/Hip Exercises: Standing   Heel Raises  Both;20 reps    Heel Raises Limitations  B toe raise x20 reps      Knee/Hip Exercises: Seated   Long Arc Quad  Strengthening;Both;3 sets;10 reps;Weights    Long Arc Quad Weight  4 lbs.    Clamshell with TheraBand  Red   x30 reps   Hamstring Curl  Strengthening;Both;3 sets;10 reps;Limitations    Hamstring Limitations  red theraband    Abduction/Adduction   Strengthening;Both;3 sets;10 reps;Limitations    Abd/Adduction Limitations  red theraband for adduction/abduction each               PT Short Term Goals - 05/29/18 1228      PT SHORT TERM GOAL #1   Title  STG=LTG        PT Long Term Goals - 05/29/18 1228      PT LONG TERM GOAL #1   Title  Patient will be independent with HEP    Time  6    Period  Weeks    Status  New      PT LONG TERM GOAL #2   Title  Patient will improve 5x sit to stand test to 15 seconds or less to decrease risk of falls and improve functional LE strength.    Time  6    Period  Weeks    Status  New      PT LONG TERM GOAL #3   Title  Patient will report 4/10 pain or less in bilateral knees with ADLs.     Time  4     Period  Weeks    Status  New            Plan - 06/20/18 1534    Clinical Impression Statement  Patient tolerated today's treatment well although reporting stiffness in BLE which she has experienced throughout her life. Patient able to tolerate increased reps and resistance today but reported "feeling" the increased resistance especially with HS curls. Minimal VCs required to correct exercise technique. Patient denied any pain or fatigue following end of treatment. Reported compliance with HEP provided at evaluation.    Rehab Potential  Fair    PT Frequency  2x / week    PT Duration  6 weeks    PT Treatment/Interventions  ADLs/Self Care Home Management;Electrical Stimulation;Cryotherapy;Ultrasound;Moist Heat;Gait training;Stair training;Neuromuscular re-education;Passive range of motion;Manual techniques;Patient/family education;Therapeutic activities;Therapeutic exercise;Vasopneumatic Device    PT Next Visit Plan  Please perform 6 minute walk test and cont with bike or nustep, LE strengthening, Modalities PRN for pain    PT Home Exercise Plan  see patient education    Consulted and Agree with Plan of Care  Patient       Patient will benefit from skilled therapeutic intervention in order to improve the following deficits and impairments:  Abnormal gait, Pain, Postural dysfunction, Decreased activity tolerance, Decreased endurance, Decreased range of motion, Decreased strength, Decreased balance, Difficulty walking  Visit Diagnosis: Chronic pain of right knee  Chronic pain of left knee  Difficulty in walking, not elsewhere classified  Other abnormalities of gait and mobility     Problem List Patient Active Problem List   Diagnosis Date Noted  . Clinical rickets 12/31/2017  . Overactive bladder 12/31/2017  . Walking difficulty due to joint disorder involving multiple sites 12/31/2017  . Bilateral hip joint arthritis 12/31/2017  . Hypothyroidism 05/21/2017  . Hypertension  05/21/2017  . Prediabetes 05/21/2017  . OA (osteoarthritis) of knee 09/26/2012  Standley Brooking, PTA 06/20/2018, 3:39 PM  [facility] [place], [place], [address] Phone: [phone]   Fax:  [phone]  Name: Rebecca Miles MRN: [id number] Date of Birth: [birth year]  PHYSICAL THERAPY DISCHARGE SUMMARY  Visits from Start of Care: 4  Current functional level related to goals / functional outcomes: See above   Remaining deficits: See goals   Education / Equipment: HEP  Plan: Patient agrees to discharge.  Patient goals were not met. Patient is being discharged due to the patient's request.  ?????    Per MD discussion, HEP provided to continue physical therapy at home.  Gabriela Eves, PT, DPT

## 2018-06-25 ENCOUNTER — Ambulatory Visit: Payer: Medicare Other | Admitting: Family Medicine

## 2018-06-26 ENCOUNTER — Ambulatory Visit: Payer: Medicare Other | Admitting: Family Medicine

## 2018-06-27 ENCOUNTER — Ambulatory Visit: Payer: Managed Care, Other (non HMO) | Admitting: Physical Therapy

## 2018-07-15 ENCOUNTER — Ambulatory Visit: Payer: Medicare Other | Admitting: Family Medicine

## 2018-07-18 ENCOUNTER — Encounter: Payer: Self-pay | Admitting: Family Medicine

## 2018-07-19 ENCOUNTER — Other Ambulatory Visit: Payer: Self-pay | Admitting: Family Medicine

## 2018-07-24 ENCOUNTER — Ambulatory Visit (INDEPENDENT_AMBULATORY_CARE_PROVIDER_SITE_OTHER): Payer: Medicare Other | Admitting: Family Medicine

## 2018-07-24 ENCOUNTER — Encounter: Payer: Self-pay | Admitting: Family Medicine

## 2018-07-24 VITALS — BP 134/87 | HR 93 | Temp 97.2°F | Ht 59.0 in | Wt 224.4 lb

## 2018-07-24 DIAGNOSIS — E039 Hypothyroidism, unspecified: Secondary | ICD-10-CM | POA: Diagnosis not present

## 2018-07-24 DIAGNOSIS — I10 Essential (primary) hypertension: Secondary | ICD-10-CM | POA: Diagnosis not present

## 2018-07-24 DIAGNOSIS — R7303 Prediabetes: Secondary | ICD-10-CM | POA: Diagnosis not present

## 2018-07-24 LAB — BAYER DCA HB A1C WAIVED: HB A1C: 5.3 % (ref <7.0)

## 2018-07-24 MED ORDER — HYDROCHLOROTHIAZIDE 12.5 MG PO TABS: 12.5000 mg | ORAL_TABLET | Freq: Every day | ORAL | 3 refills | Status: DC

## 2018-07-24 MED ORDER — LEVOTHYROXINE SODIUM 137 MCG PO TABS: ORAL_TABLET | ORAL | 3 refills | Status: DC

## 2018-07-24 MED ORDER — LOSARTAN POTASSIUM 50 MG PO TABS: 50.0000 mg | ORAL_TABLET | Freq: Every day | ORAL | 3 refills | Status: DC

## 2018-07-24 NOTE — Progress Notes (Signed)
BP 134/87   Pulse 93   Temp (!) 97.2 F (36.2 C) (Oral)   Ht 4' 11"  (1.499 m)   Wt 224 lb 6.4 oz (101.8 kg)   BMI 45.32 kg/m    Subjective:    Patient ID: Rebecca Miles, female    DOB: 1966/06/13, 52 y.o.   MRN: [id number]  HPI: Rebecca Miles is a 52 y.o. female presenting on 07/24/2018 for Hypothyroidism (6 month follow up) and Hyperlipidemia   HPI Prediabetes Patient comes in today for recheck of his diabetes. Patient has been currently taking no medication but is being monitored for now, will check A1c today. Patient is currently on an ACE inhibitor/ARB. Patient has not seen an ophthalmologist this year. Patient denies any issues with their feet.   Hypothyroidism recheck Patient is coming in for thyroid recheck today as well. They deny any issues with hair changes or heat or cold problems or diarrhea or constipation. They deny any chest pain or palpitations. They are currently on levothyroxine 149mcrograms   Hypertension Patient is currently on losartan and hydrochlorothiazide, and their blood pressure today is 134/87. Patient denies any lightheadedness or dizziness. Patient denies headaches, blurred vision, chest pains, shortness of breath, or weakness. Denies any side effects from medication and is content with current medication.   Relevant past medical, surgical, family and social history reviewed and updated as indicated. Interim medical history since our last visit reviewed. Allergies and medications reviewed and updated.  Review of Systems  Constitutional: Negative for chills and fever.  Eyes: Negative for visual disturbance.  Respiratory: Negative for chest tightness and shortness of breath.   Cardiovascular: Negative for chest pain and leg swelling.  Musculoskeletal: Negative for back pain and gait problem.  Skin: Negative for rash.  Neurological: Negative for dizziness, weakness, light-headedness, numbness and headaches.  Psychiatric/Behavioral: Negative  for agitation and behavioral problems.  All other systems reviewed and are negative.   Per HPI unless specifically indicated above   Allergies as of 07/24/2018   No Known Allergies     Medication List        Accurate as of 07/24/18 11:33 AM. Always use your most recent med list.          cetirizine 10 MG tablet Commonly known as:  ZYRTEC Take 1 tablet (10 mg total) by mouth daily.   fluticasone 50 MCG/ACT nasal spray Commonly known as:  FLONASE Place 2 sprays into both nostrils daily.   GERITOL COMPLETE PO Take 1 tablet by mouth daily.   hydrochlorothiazide 12.5 MG tablet Commonly known as:  HYDRODIURIL Take 1 tablet (12.5 mg total) by mouth daily.   levothyroxine 137 MCG tablet Commonly known as:  SYNTHROID, LEVOTHROID TAKE 1 TABLET BY MOUTH DAILY BEFORE BREAKFAST   losartan 50 MG tablet Commonly known as:  COZAAR Take 1 tablet (50 mg total) by mouth daily.   phosphorus 155-852-130 MG tablet Commonly known as:  K PHOS NEUTRAL Take by mouth 2 (two) times daily.   traMADol 50 MG tablet Commonly known as:  ULTRAM Take by mouth every 6 (six) hours as needed.          Objective:    BP 134/87   Pulse 93   Temp (!) 97.2 F (36.2 C) (Oral)   Ht 4' 11"  (1.499 m)   Wt 224 lb 6.4 oz (101.8 kg)   BMI 45.32 kg/m   Wt Readings from Last 3 Encounters:  07/24/18 224 lb 6.4 oz (101.8 kg)  06/19/18  213 lb 9.6 oz (96.9 kg)  05/06/18 217 lb 9.6 oz (98.7 kg)    Physical Exam  Constitutional: She is oriented to person, place, and time. She appears well-developed and well-nourished. No distress.  Eyes: Conjunctivae are normal.  Neck: Neck supple. No thyromegaly present.  Cardiovascular: Normal rate, regular rhythm, normal heart sounds and intact distal pulses.  No murmur heard. Pulmonary/Chest: Effort normal and breath sounds normal. No respiratory distress. She has no wheezes.  Musculoskeletal: Normal range of motion. She exhibits no edema.  Lymphadenopathy:     She has no cervical adenopathy.  Neurological: She is alert and oriented to person, place, and time. Coordination normal.  Skin: Skin is warm and dry. No rash noted. She is not diaphoretic.  Psychiatric: She has a normal mood and affect. Her behavior is normal.  Nursing note and vitals reviewed.       Assessment & Plan:   Problem List Items Addressed This Visit      Cardiovascular and Mediastinum   Hypertension   Relevant Medications   hydrochlorothiazide (HYDRODIURIL) 12.5 MG tablet   losartan (COZAAR) 50 MG tablet   Other Relevant Orders   CMP14+EGFR     Endocrine   Hypothyroidism   Relevant Medications   levothyroxine (SYNTHROID, LEVOTHROID) 137 MCG tablet   Other Relevant Orders   TSH     Other   Prediabetes - Primary   Relevant Orders   CBC with Differential/Platelet   Lipid panel   Bayer DCA Hb A1c Waived       Follow up plan: Return in about 6 months (around 01/22/2019), or if symptoms worsen or fail to improve, for Thyroid and hypertension.  Counseling provided for all of the vaccine components Orders Placed This Encounter  Procedures  . CBC with Differential/Platelet  . CMP14+EGFR  . Lipid panel  . Bayer DCA Hb A1c Waived  . TSH    Caryl Pina, MD [facility] 07/24/2018, 11:33 AM

## 2018-07-25 LAB — CBC WITH DIFFERENTIAL/PLATELET
BASOS: 1 %
Basophils Absolute: 0.1 10*3/uL (ref 0.0–0.2)
EOS (ABSOLUTE): 0.1 10*3/uL (ref 0.0–0.4)
EOS: 1 %
HEMATOCRIT: 40.8 % (ref 34.0–46.6)
Hemoglobin: 13.6 g/dL (ref 11.1–15.9)
Immature Grans (Abs): 0 10*3/uL (ref 0.0–0.1)
Immature Granulocytes: 0 %
LYMPHS ABS: 2.9 10*3/uL (ref 0.7–3.1)
Lymphs: 29 %
MCH: 29.8 pg (ref 26.6–33.0)
MCHC: 33.3 g/dL (ref 31.5–35.7)
MCV: 89 fL (ref 79–97)
Monocytes Absolute: 0.7 10*3/uL (ref 0.1–0.9)
Monocytes: 7 %
Neutrophils Absolute: 6.2 10*3/uL (ref 1.4–7.0)
Neutrophils: 62 %
Platelets: 369 10*3/uL (ref 150–450)
RBC: 4.57 x10E6/uL (ref 3.77–5.28)
RDW: 14.1 % (ref 12.3–15.4)
WBC: 10.1 10*3/uL (ref 3.4–10.8)

## 2018-07-25 LAB — CMP14+EGFR
A/G RATIO: 1.8 (ref 1.2–2.2)
ALK PHOS: 132 IU/L — AB (ref 39–117)
ALT: 17 IU/L (ref 0–32)
AST: 14 IU/L (ref 0–40)
Albumin: 4 g/dL (ref 3.5–5.5)
BUN/Creatinine Ratio: 13 (ref 9–23)
BUN: 7 mg/dL (ref 6–24)
Bilirubin Total: 0.4 mg/dL (ref 0.0–1.2)
CHLORIDE: 106 mmol/L (ref 96–106)
CO2: 24 mmol/L (ref 20–29)
Calcium: 9.3 mg/dL (ref 8.7–10.2)
Creatinine, Ser: 0.55 mg/dL — ABNORMAL LOW (ref 0.57–1.00)
GFR calc Af Amer: 126 mL/min/{1.73_m2} (ref >59)
GFR calc non Af Amer: 110 mL/min/{1.73_m2} (ref >59)
GLOBULIN, TOTAL: 2.2 g/dL (ref 1.5–4.5)
Glucose: 77 mg/dL (ref 65–99)
POTASSIUM: 3.9 mmol/L (ref 3.5–5.2)
Sodium: 142 mmol/L (ref 134–144)
Total Protein: 6.2 g/dL (ref 6.0–8.5)

## 2018-07-25 LAB — TSH: TSH: 1.08 u[IU]/mL (ref 0.450–4.500)

## 2018-07-25 LAB — LIPID PANEL
CHOLESTEROL TOTAL: 186 mg/dL (ref 100–199)
Chol/HDL Ratio: 3.9 ratio (ref 0.0–4.4)
HDL: 48 mg/dL (ref >39)
LDL Calculated: 112 mg/dL — ABNORMAL HIGH (ref 0–99)
Triglycerides: 131 mg/dL (ref 0–149)
VLDL CHOLESTEROL CAL: 26 mg/dL (ref 5–40)

## 2018-07-30 ENCOUNTER — Telehealth: Payer: Self-pay | Admitting: Family Medicine

## 2018-07-30 NOTE — Telephone Encounter (Signed)
I do not order a number of sessions, I sent her for evaluation of physical therapy and they picked the number of sessions from what the insurance will approve, we have never ordered a specific number of sessions, physical therapy does that

## 2018-07-31 NOTE — Telephone Encounter (Signed)
Patient notified. She verbalized understanding. She advised that she would call and discuss with PT and also her insurance company

## 2018-08-02 ENCOUNTER — Encounter: Payer: Self-pay | Admitting: *Deleted

## 2018-08-12 ENCOUNTER — Telehealth: Payer: Self-pay | Admitting: Family Medicine

## 2018-08-12 DIAGNOSIS — M170 Bilateral primary osteoarthritis of knee: Secondary | ICD-10-CM

## 2018-08-12 MED ORDER — TRIAMCINOLONE ACETONIDE 0.1 % EX CREA: 1.0000 "application " | TOPICAL_CREAM | Freq: Two times a day (BID) | CUTANEOUS | 0 refills | Status: DC

## 2018-08-12 NOTE — Telephone Encounter (Signed)
Pt wants refill on her Triamcinolone that she uses for dry skin "rash" on her nose and chin and states she has talked to you before about this. Ok to refill?   Also can we do referral to ortho for Alusio or do you need to see her again.

## 2018-08-12 NOTE — Telephone Encounter (Signed)
Pt aware of MD feedback and rx/referral placed.

## 2018-08-12 NOTE — Telephone Encounter (Signed)
We can give her a refill of triamcinolone but I do not recommend using on the face more than a couple days in a row, if she wants something for the face that I would recommend using hydrocortisone instead.  Yes we can go ahead and do the referral to Dr. Posey Pronto for orthopedic for her bilateral knee osteoarthritis

## 2018-08-12 NOTE — Telephone Encounter (Signed)
Pt rtn call to [employer]

## 2018-08-27 DIAGNOSIS — M170 Bilateral primary osteoarthritis of knee: Secondary | ICD-10-CM | POA: Diagnosis not present

## 2018-08-27 DIAGNOSIS — M1712 Unilateral primary osteoarthritis, left knee: Secondary | ICD-10-CM | POA: Diagnosis not present

## 2018-08-27 DIAGNOSIS — M1711 Unilateral primary osteoarthritis, right knee: Secondary | ICD-10-CM | POA: Diagnosis not present

## 2018-11-14 ENCOUNTER — Encounter: Payer: Self-pay | Admitting: Family Medicine

## 2018-11-14 ENCOUNTER — Ambulatory Visit (INDEPENDENT_AMBULATORY_CARE_PROVIDER_SITE_OTHER): Payer: 59 | Admitting: Family Medicine

## 2018-11-14 VITALS — BP 143/93 | HR 92 | Temp 98.3°F | Ht 59.0 in | Wt 225.6 lb

## 2018-11-14 DIAGNOSIS — E550 Rickets, active: Secondary | ICD-10-CM

## 2018-11-14 DIAGNOSIS — R262 Difficulty in walking, not elsewhere classified: Secondary | ICD-10-CM

## 2018-11-14 DIAGNOSIS — M160 Bilateral primary osteoarthritis of hip: Secondary | ICD-10-CM | POA: Diagnosis not present

## 2018-11-14 NOTE — Progress Notes (Addendum)
BP (!) 143/93   Pulse 92   Temp 98.3 F (36.8 C) (Oral)   Ht 4\' 11"  (1.499 m)   Wt 225 lb 9.6 oz (102.3 kg)   SpO2 98%   BMI 45.57 kg/m    Subjective:    Patient ID: Rebecca Miles, female    DOB: 10-Jan-1966, 53 y.o.   MRN: [id number]  HPI: Rebecca Miles is a 53 y.o. female presenting on 11/14/2018 for paper work for [employer]   HPI Mobility assessment Chronic rickets and b/l hip and knee pain that has been going on for some time but has recently started to affect her even more over the past year.  She has been having increasingly difficult problems with walking and getting around and even going to the grocery store or even getting around her house.  She has been continued to try to work with physical therapy and trying to get strength back but is been challenging for her.  Because of her lack of mobility she has been decreasing in strength in both upper extremities and lower extremities and her pain in both her knees and both of her hips has really been limiting her because of her history of rickets and developing arthritis.  Because of her history of rickets she is not a great candidate for joint replacement.  Patient comes in to do a face-to-face for power wheelchair/Hover[employer].  Patient has chronic history of rickets since she was a child and has had surgeries on her right femur to try and correct the deformity but she is to remain deformed and is very bowlegged.  Because of her bowlegged it has limited her mobility throughout her life and has led to significant osteo-arthritis in both knees and her hips and also led to her being obese because she has not been able to be mobile because of the pain.  She is currently using a cane for mobility and she cannot walk even short distances of 5 feet with out the cane and with a cane she cannot walk more than a half a block or 2 grocery store house which is about 100 feet.  She has to stop frequently from the pain even with a cane.   Frequently at the store she is currently using their power mobility devices unless there is one not available which is why she wants her own device.  She does not get out of her house frequently because of her limited mobility with a cane.  The pain has been worsening because of her arthritis and is gradually been getting harder over the past few years because of bilateral knee pain and hip pain for her to be able to ambulate.  She is currently using tramadol and ibuprofen and Tylenol for the pain which gives her some function but still very limited.  She has done a course of physical therapy to help with this but her pain still very limits her.  Her pace of ambulation with a cane is less than 1 mph currently and this can cause a significant issue with her overactive bladder and being able to make it to the restroom even her own house safely without falling.  She has had 2 falls in the past year where she stand her foot on a small step and fell onto her side or front.  She did not sustain any fractures at this time but there is concerned that she may continue to fall as it has become increasingly difficult for her to  ambulate even around her house.  Currently she uses a cane but she feels like she cannot even go to the grocery store without using somebody else's power mobility device because she cannot walk even to the grocery store aisles without it.  A power mobility device would greatly benefit her in getting to the bathroom to toilet and bathe and to get to the kitchen to help her prepare meals.  Currently she lives with grandchildren who live upstairs but she stays on the first floor and cannot go upstairs because of her limited mobility.  The pain has limited her ability to wash dishes or even hold a job because she can no longer walk even short distances without significant pain and limitation.  Her balance has also become significantly weakened and she cannot currently balance on either leg  individually.   Patient has never tried a manual wheelchair and do not know if she would have the physical strength and endurance in her upper extremity to be able to do it.  She is morbidly obese and does have some general deconditioning due to her obesity and lack of motion and do not know if she has the endurance upper extremity to do manual wheelchair  Relevant past medical, surgical, family and social history reviewed and updated as indicated. Interim medical history since our last visit reviewed. Allergies and medications reviewed and updated.  Review of Systems  Constitutional: Negative for chills and fever.  Eyes: Negative for visual disturbance.  Respiratory: Negative for chest tightness and shortness of breath.   Cardiovascular: Negative for chest pain and leg swelling.  Musculoskeletal: Positive for arthralgias, gait problem and joint swelling. Negative for back pain.  Skin: Negative for rash.  Neurological: Negative for light-headedness and headaches.  Psychiatric/Behavioral: Negative for agitation and behavioral problems.  All other systems reviewed and are negative.   Per HPI unless specifically indicated above   Allergies as of 11/14/2018   No Known Allergies     Medication List       Accurate as of November 14, 2018  3:32 PM. Always use your most recent med list.        cetirizine 10 MG tablet Commonly known as:  ZYRTEC Take 1 tablet (10 mg total) by mouth daily.   fluticasone 50 MCG/ACT nasal spray Commonly known as:  FLONASE Place 2 sprays into both nostrils daily.   GERITOL COMPLETE PO Take 1 tablet by mouth daily.   hydrochlorothiazide 12.5 MG tablet Commonly known as:  HYDRODIURIL Take 1 tablet (12.5 mg total) by mouth daily.   levothyroxine 137 MCG tablet Commonly known as:  SYNTHROID, LEVOTHROID TAKE 1 TABLET BY MOUTH DAILY BEFORE BREAKFAST   losartan 50 MG tablet Commonly known as:  COZAAR Take 1 tablet (50 mg total) by mouth daily.    phosphorus 155-852-130 MG tablet Commonly known as:  K PHOS NEUTRAL Take by mouth 2 (two) times daily.   traMADol 50 MG tablet Commonly known as:  ULTRAM Take by mouth every 6 (six) hours as needed.   triamcinolone cream 0.1 % Commonly known as:  KENALOG Apply 1 application topically 2 (two) times daily.          Objective:    BP (!) 143/93   Pulse 92   Temp 98.3 F (36.8 C) (Oral)   Ht 4\' 11"  (1.499 m)   Wt 225 lb 9.6 oz (102.3 kg)   SpO2 98%   BMI 45.57 kg/m   Wt Readings from Last 3 Encounters:  11/14/18 225 lb 9.6 oz (102.3 kg)  07/24/18 224 lb 6.4 oz (101.8 kg)  06/19/18 213 lb 9.6 oz (96.9 kg)    Physical Exam Vitals signs and nursing note reviewed.  Constitutional:      General: She is not in acute distress.    Appearance: She is well-developed. She is not diaphoretic.  Eyes:     Conjunctiva/sclera: Conjunctivae normal.  Cardiovascular:     Rate and Rhythm: Normal rate and regular rhythm.     Heart sounds: Normal heart sounds. No murmur.  Pulmonary:     Effort: Pulmonary effort is normal. No respiratory distress.     Breath sounds: Normal breath sounds. No wheezing.  Musculoskeletal: Normal range of motion.     Right knee: She exhibits swelling, effusion and deformity (Rickets). She exhibits normal range of motion. Tenderness found. Medial joint line and lateral joint line tenderness noted.     Left knee: She exhibits swelling and deformity (Rickets). She exhibits normal range of motion. Tenderness found. Medial joint line and lateral joint line tenderness noted.  Skin:    General: Skin is warm and dry.     Findings: No rash.  Neurological:     Mental Status: She is alert and oriented to person, place, and time.     Motor: No weakness or abnormal muscle tone.     Coordination: Coordination normal.     Gait: Gait abnormal.     Comments: 5 out of 5 right upper extremity, 5 out of 5 left upper extremity, 5 out of 5 right lower extremity, 5 out of 5 left  lower extremity  Psychiatric:        Behavior: Behavior normal.         Assessment & Plan:   Problem List Items Addressed This Visit      Musculoskeletal and Integument   Clinical rickets - Primary   Bilateral hip joint arthritis     Other   Walking difficulty due to joint disorder involving multiple sites      Patient has completed a full course of physical therapy and has improved in strength but still has significant difficulty ambulating because of pain and rectus deformity. Follow up plan: Return if symptoms worsen or fail to improve.  Counseling provided for all of the vaccine components No orders of the defined types were placed in this encounter.   Caryl Pina, MD [facility] 11/14/2018, 3:32 PM

## 2018-11-15 ENCOUNTER — Telehealth: Payer: Self-pay | Admitting: *Deleted

## 2018-11-15 MED ORDER — OLMESARTAN MEDOXOMIL 20 MG PO TABS: 10.0000 mg | ORAL_TABLET | Freq: Every day | ORAL | 3 refills | Status: DC

## 2018-11-15 NOTE — Telephone Encounter (Signed)
Patient aware and verbalizes understanding. 

## 2018-11-15 NOTE — Telephone Encounter (Signed)
Fax from [facility] Alternative request Losartan 25, 50 & 100 mg is on manufacturer backorder Please advise and send in alternative

## 2018-11-15 NOTE — Telephone Encounter (Signed)
Please let the patient know I sent Benicar because the other one was on back order Caryl Pina, MD [facility] 11/15/2018, 1:12 PM

## 2018-12-19 DIAGNOSIS — M1712 Unilateral primary osteoarthritis, left knee: Secondary | ICD-10-CM | POA: Diagnosis not present

## 2018-12-19 DIAGNOSIS — M25562 Pain in left knee: Secondary | ICD-10-CM | POA: Diagnosis not present

## 2018-12-19 DIAGNOSIS — M170 Bilateral primary osteoarthritis of knee: Secondary | ICD-10-CM | POA: Diagnosis not present

## 2018-12-19 DIAGNOSIS — M1711 Unilateral primary osteoarthritis, right knee: Secondary | ICD-10-CM | POA: Diagnosis not present

## 2018-12-19 DIAGNOSIS — M25561 Pain in right knee: Secondary | ICD-10-CM | POA: Diagnosis not present

## 2019-01-22 ENCOUNTER — Other Ambulatory Visit: Payer: Self-pay

## 2019-01-22 ENCOUNTER — Encounter: Payer: Self-pay | Admitting: Family Medicine

## 2019-01-22 ENCOUNTER — Ambulatory Visit (INDEPENDENT_AMBULATORY_CARE_PROVIDER_SITE_OTHER): Payer: 59 | Admitting: Family Medicine

## 2019-01-22 DIAGNOSIS — R7303 Prediabetes: Secondary | ICD-10-CM | POA: Diagnosis not present

## 2019-01-22 DIAGNOSIS — I10 Essential (primary) hypertension: Secondary | ICD-10-CM | POA: Diagnosis not present

## 2019-01-22 DIAGNOSIS — E039 Hypothyroidism, unspecified: Secondary | ICD-10-CM | POA: Diagnosis not present

## 2019-01-22 NOTE — Progress Notes (Signed)
Virtual Visit via telephone Note  I connected with Rebecca Miles on 01/22/19 at 1521 by telephone and verified that I am speaking with the correct person using two identifiers. Rebecca Miles is currently located at home and no other people are currently with her during visit. The provider, Fransisca Kaufmann , MD is located in their office at time of visit.  Call ended at 1540  I discussed the limitations, risks, security and privacy concerns of performing an evaluation and management service by telephone and the availability of in person appointments. I also discussed with the patient that there may be a patient responsible charge related to this service. The patient expressed understanding and agreed to proceed.   History and Present Illness: Prediabetes Patient comes in today for recheck of his diabetes. Patient has been currently taking no medication. Patient is currently on an ACE inhibitor/ARB. Patient has not seen an ophthalmologist this year. Patient denies any issues with their feet.   Hypertension Patient is currently on hctz and olmesartan, and their blood pressure today is unknown but will check . Patient denies any lightheadedness or dizziness. Patient denies headaches, blurred vision, chest pains, shortness of breath, or weakness. Denies any side effects from medication and is content with current medication.   Hypothyroidism recheck Patient is coming in for thyroid recheck today as well. They deny any issues with hair changes or heat or cold problems or diarrhea or constipation. They deny any chest pain or palpitations. They are currently on levothyroxine 135micrograms   No diagnosis found.  Outpatient Encounter Medications as of 01/22/2019  Medication Sig  . cetirizine (ZYRTEC) 10 MG tablet Take 1 tablet (10 mg total) by mouth daily.  . fluticasone (FLONASE) 50 MCG/ACT nasal spray Place 2 sprays into both nostrils daily.  . hydrochlorothiazide (HYDRODIURIL) 12.5  MG tablet Take 1 tablet (12.5 mg total) by mouth daily.  . Iron-Vitamins (GERITOL COMPLETE PO) Take 1 tablet by mouth daily.  Marland Kitchen levothyroxine (SYNTHROID, LEVOTHROID) 137 MCG tablet TAKE 1 TABLET BY MOUTH DAILY BEFORE BREAKFAST  . losartan (COZAAR) 50 MG tablet Take 1 tablet (50 mg total) by mouth daily.  Marland Kitchen olmesartan (BENICAR) 20 MG tablet Take 0.5 tablets (10 mg total) by mouth daily.  . phosphorus (K PHOS NEUTRAL) 155-852-130 MG tablet Take by mouth 2 (two) times daily.  . traMADol (ULTRAM) 50 MG tablet Take by mouth every 6 (six) hours as needed.  . triamcinolone cream (KENALOG) 0.1 % Apply 1 application topically 2 (two) times daily.   No facility-administered encounter medications on file as of 01/22/2019.     Review of Systems  Constitutional: Negative for chills and fever.  Eyes: Negative for redness and visual disturbance.  Respiratory: Negative for chest tightness and shortness of breath.   Cardiovascular: Negative for chest pain and leg swelling.  Musculoskeletal: Positive for arthralgias and gait problem. Negative for back pain.  Skin: Negative for rash.  Neurological: Positive for weakness. Negative for dizziness, light-headedness and headaches.  Psychiatric/Behavioral: Negative for agitation and behavioral problems.  All other systems reviewed and are negative.   Observations/Objective: Patient sounds comfortable and in no acute distress  Assessment and Plan: Problem List Items Addressed This Visit      Cardiovascular and Mediastinum   Hypertension     Endocrine   Hypothyroidism     Other   Prediabetes - Primary       Follow Up Instructions:  Follow up in 6 months   I discussed the assessment and  treatment plan with the patient. The patient was provided an opportunity to ask questions and all were answered. The patient agreed with the plan and demonstrated an understanding of the instructions.   The patient was advised to call back or seek an in-person  evaluation if the symptoms worsen or if the condition fails to improve as anticipated.  The above assessment and management plan was discussed with the patient. The patient verbalized understanding of and has agreed to the management plan. Patient is aware to call the clinic if symptoms persist or worsen. Patient is aware when to return to the clinic for a follow-up visit. Patient educated on when it is appropriate to go to the emergency department.    I provided 19 minutes of non-face-to-face time during this encounter.    Worthy Rancher, MD

## 2019-02-24 DIAGNOSIS — Z87891 Personal history of nicotine dependence: Secondary | ICD-10-CM | POA: Diagnosis not present

## 2019-02-24 DIAGNOSIS — K5720 Diverticulitis of large intestine with perforation and abscess without bleeding: Secondary | ICD-10-CM | POA: Diagnosis not present

## 2019-02-24 DIAGNOSIS — E039 Hypothyroidism, unspecified: Secondary | ICD-10-CM | POA: Diagnosis not present

## 2019-02-24 DIAGNOSIS — K5792 Diverticulitis of intestine, part unspecified, without perforation or abscess without bleeding: Secondary | ICD-10-CM | POA: Diagnosis not present

## 2019-02-24 DIAGNOSIS — I10 Essential (primary) hypertension: Secondary | ICD-10-CM | POA: Diagnosis not present

## 2019-05-16 ENCOUNTER — Telehealth: Payer: Self-pay | Admitting: Family Medicine

## 2019-05-16 ENCOUNTER — Other Ambulatory Visit: Payer: Self-pay | Admitting: Family Medicine

## 2019-05-16 MED ORDER — VALSARTAN-HYDROCHLOROTHIAZIDE 160-12.5 MG PO TABS: 1.0000 | ORAL_TABLET | Freq: Every day | ORAL | 3 refills | Status: DC

## 2019-05-16 NOTE — Telephone Encounter (Signed)
Patient aware and verbalizes understanding. 

## 2019-05-16 NOTE — Telephone Encounter (Signed)
Please let the patient know that I sent the valsartan hydrochlorothiazide for her to replace the olmesartan and hydrochlorothiazide that we had to switch to when they were on recall. Caryl Pina, MD [facility] 05/16/2019, 12:13 PM

## 2019-05-22 ENCOUNTER — Encounter: Payer: Self-pay | Admitting: Internal Medicine

## 2019-05-22 ENCOUNTER — Ambulatory Visit (INDEPENDENT_AMBULATORY_CARE_PROVIDER_SITE_OTHER): Payer: 59 | Admitting: Family Medicine

## 2019-05-22 ENCOUNTER — Encounter: Payer: Self-pay | Admitting: Family Medicine

## 2019-05-22 DIAGNOSIS — Z1211 Encounter for screening for malignant neoplasm of colon: Secondary | ICD-10-CM | POA: Diagnosis not present

## 2019-05-22 DIAGNOSIS — K5792 Diverticulitis of intestine, part unspecified, without perforation or abscess without bleeding: Secondary | ICD-10-CM

## 2019-05-22 NOTE — Progress Notes (Signed)
Virtual Visit via telephone Note  I connected with Rebecca Miles on 05/22/19 at 1302 by telephone and verified that I am speaking with the correct person using two identifiers. Rebecca Miles is currently located at home and no other people are currently with her during visit. The provider, Fransisca Kaufmann Keamber Macfadden, MD is located in their office at time of visit.  Call ended at 1312  I discussed the limitations, risks, security and privacy concerns of performing an evaluation and management service by telephone and the availability of in person appointments. I also discussed with the patient that there may be a patient responsible charge related to this service. The patient expressed understanding and agreed to proceed.   History and Present Illness: Patient was in the hospital on 6/15 for 3 days.  She was having sever abdominal pains and cramping and she went to the ER for 3 days. She went to [facility] and was told that she had diverticulitis and they gave her an antibiotics and she is doing a lot better since leaving the hospital. She has been feeling a lot better since leaving  No diagnosis found.  Outpatient Encounter Medications as of 05/22/2019  Medication Sig  . cetirizine (ZYRTEC) 10 MG tablet Take 1 tablet (10 mg total) by mouth daily.  . fluticasone (FLONASE) 50 MCG/ACT nasal spray Place 2 sprays into both nostrils daily.  . Iron-Vitamins (GERITOL COMPLETE PO) Take 1 tablet by mouth daily.  Marland Kitchen levothyroxine (SYNTHROID, LEVOTHROID) 137 MCG tablet TAKE 1 TABLET BY MOUTH DAILY BEFORE BREAKFAST  . phosphorus (K PHOS NEUTRAL) 155-852-130 MG tablet Take by mouth 2 (two) times daily.  . traMADol (ULTRAM) 50 MG tablet Take by mouth every 6 (six) hours as needed.  . triamcinolone cream (KENALOG) 0.1 % APPLY TO AFFECTED AREA TWICE A DAY  . valsartan-hydrochlorothiazide (DIOVAN HCT) 160-12.5 MG tablet Take 1 tablet by mouth daily.   No facility-administered encounter medications on  file as of 05/22/2019.     Review of Systems  Constitutional: Negative for chills and fever.  Eyes: Negative for visual disturbance.  Respiratory: Negative for chest tightness and shortness of breath.   Cardiovascular: Negative for chest pain and leg swelling.  Gastrointestinal: Negative for abdominal pain, constipation, diarrhea, nausea and vomiting.  Genitourinary: Negative for difficulty urinating and dysuria.  Musculoskeletal: Negative for back pain and gait problem.  Skin: Negative for rash.  Neurological: Negative for light-headedness and headaches.  Psychiatric/Behavioral: Negative for agitation and behavioral problems.  All other systems reviewed and are negative.   Observations/Objective: Patient sounds comfortable and in no acute  Assessment and Plan: Problem List Items Addressed This Visit    None    Visit Diagnoses    Diverticulitis    -  Primary   Relevant Orders   Ambulatory referral to Gastroenterology   Colon cancer screening       Relevant Orders   Ambulatory referral to Gastroenterology       Follow Up Instructions:  As needed   I discussed the assessment and treatment plan with the patient. The patient was provided an opportunity to ask questions and all were answered. The patient agreed with the plan and demonstrated an understanding of the instructions.   The patient was advised to call back or seek an in-person evaluation if the symptoms worsen or if the condition fails to improve as anticipated.  The above assessment and management plan was discussed with the patient. The patient verbalized understanding of and has agreed  to the management plan. Patient is aware to call the clinic if symptoms persist or worsen. Patient is aware when to return to the clinic for a follow-up visit. Patient educated on when it is appropriate to go to the emergency department.    I provided 10 minutes of non-face-to-face time during this encounter.    Worthy Rancher, MD

## 2019-05-29 DIAGNOSIS — M1711 Unilateral primary osteoarthritis, right knee: Secondary | ICD-10-CM | POA: Diagnosis not present

## 2019-05-29 DIAGNOSIS — M170 Bilateral primary osteoarthritis of knee: Secondary | ICD-10-CM | POA: Diagnosis not present

## 2019-05-29 DIAGNOSIS — M1712 Unilateral primary osteoarthritis, left knee: Secondary | ICD-10-CM | POA: Diagnosis not present

## 2019-05-29 DIAGNOSIS — M25561 Pain in right knee: Secondary | ICD-10-CM | POA: Diagnosis not present

## 2019-05-29 DIAGNOSIS — M25562 Pain in left knee: Secondary | ICD-10-CM | POA: Diagnosis not present

## 2019-06-19 ENCOUNTER — Ambulatory Visit (INDEPENDENT_AMBULATORY_CARE_PROVIDER_SITE_OTHER): Payer: Medicare HMO | Admitting: Gastroenterology

## 2019-06-19 ENCOUNTER — Encounter: Payer: Self-pay | Admitting: Gastroenterology

## 2019-06-19 ENCOUNTER — Encounter: Payer: Self-pay | Admitting: *Deleted

## 2019-06-19 ENCOUNTER — Other Ambulatory Visit: Payer: Self-pay

## 2019-06-19 ENCOUNTER — Other Ambulatory Visit: Payer: Self-pay | Admitting: *Deleted

## 2019-06-19 DIAGNOSIS — Z8719 Personal history of other diseases of the digestive system: Secondary | ICD-10-CM

## 2019-06-19 DIAGNOSIS — Z6841 Body Mass Index (BMI) 40.0 and over, adult: Secondary | ICD-10-CM | POA: Diagnosis not present

## 2019-06-19 MED ORDER — PEG 3350-KCL-NA BICARB-NACL 420 G PO SOLR: 4000.0000 mL | Freq: Once | ORAL | 0 refills | Status: AC

## 2019-06-19 NOTE — H&P (View-Only) (Signed)
Primary Care Physician:  Dettinger, Fransisca Kaufmann, MD Primary Gastroenterologist:  Dr. Gala Romney   Chief Complaint  Patient presents with  . Colonoscopy    never had tcs  . Diverticulitis    hx, states she was taking a lot of vitamins at the time    HPI:   Rebecca Miles is a 53 y.o. female presenting today at the request of Dr. Warrick Parisian for colonoscopy, with history of diverticulitis in June 2020. CT findings at [facility] with acute sigmoid diverticulitis with 3.7 cm intramural abscess Correlation with colonoscopy history is recommended as an underlying mass is not excluded.   She notes resolution of symptoms. No abdominal pain. BM daily in the morning. Rare constipation. No rectal bleeding or changes in bowel habits. No weight loss or lack of appetite. Denies GERD symptoms. No dysphagia. No prior colonoscopy. Denying fever/chills.   Although Ultram listed in med list, she only takes a few every few months.   Past Medical History:  Diagnosis Date  . Allergy   . Arthritis   . Hypertension   . Hypothyroidism   . Osteoarthritis    ankles and joints  . Rickets     Past Surgical History:  Procedure Laterality Date  . CESAREAN SECTION    . FEMUR IM NAIL  09/26/2012   Procedure: INTRAMEDULLARY (IM) RETROGRADE FEMORAL NAILING;  Surgeon: Gearlean Alf, MD;  Location: [facility];  Service: Orthopedics;  Laterality: Right;  . LEG SURGERY     bilateral at age 85   . TIBIA OSTEOTOMY  09/26/2012   Procedure: TIBIAL OSTEOTOMY;  Surgeon: Gearlean Alf, MD;  Location: [facility];  Service: Orthopedics;  Laterality: Right;  RIGHT FEMORAL CORRECTIVE OSTEOMITY WITH RETROGRADE IM FEMORAL NAIL  . TUBAL LIGATION    . TUBAL LIGATION      Current Outpatient Medications  Medication Sig Dispense Refill  . cetirizine (ZYRTEC) 10 MG tablet Take 1 tablet (10 mg total) by mouth daily. 30 tablet 11  . fluticasone (FLONASE) 50 MCG/ACT nasal spray Place 2 sprays into both nostrils daily. (Patient taking  differently: Place 2 sprays into both nostrils as needed. ) 16 g 6  . Iron-Vitamins (GERITOL COMPLETE PO) Take 1 tablet by mouth daily.    . phosphorus (K PHOS NEUTRAL) 155-852-130 MG tablet Take by mouth 2 (two) times daily.    . traMADol (ULTRAM) 50 MG tablet Take by mouth every 6 (six) hours as needed.    . triamcinolone cream (KENALOG) 0.1 % APPLY TO AFFECTED AREA TWICE A DAY 30 g 0  . valsartan-hydrochlorothiazide (DIOVAN HCT) 160-12.5 MG tablet Take 1 tablet by mouth daily. 90 tablet 3  . levothyroxine (SYNTHROID, LEVOTHROID) 137 MCG tablet TAKE 1 TABLET BY MOUTH DAILY BEFORE BREAKFAST (Patient not taking: Reported on 06/19/2019) 90 tablet 3   No current facility-administered medications for this visit.     Allergies as of 06/19/2019  . (No Known Allergies)    Family History  Problem Relation Age of Onset  . Diabetes Mother   . Hypertension Mother   . Kidney disease Mother   . Parkinson's disease Father   . Lupus Sister   . Drug abuse Brother   . Early death Brother   . Stroke Brother   . Arthritis Maternal Grandmother   . Hypertension Maternal Grandmother   . Kidney disease Maternal Grandmother   . Graves' disease Son   . Myasthenia gravis Son   . Rickets Son   . Rickets Son   .  Rickets Son   . ADD / ADHD Son   . Colon cancer Neg Hx   . Colon polyps Neg Hx     Social History   Socioeconomic History  . Marital status: Married    Spouse name: Not on file  . Number of children: 4  . Years of education: 10  . Highest education level: Some college, no degree  Occupational History  . Occupation: disabled  Social Needs  . Financial resource strain: Not hard at all  . Food insecurity    Worry: Never true    Inability: Never true  . Transportation needs    Medical: No    Non-medical: No  Tobacco Use  . Smoking status: Former Smoker    Packs/day: 0.50    Years: 20.00    Pack years: 10.00    Quit date: 09/12/1999    Years since quitting: 19.8  . Smokeless  tobacco: Never Used  . Tobacco comment: No current tobacco use  Substance and Sexual Activity  . Alcohol use: Yes    Comment: occasional  . Drug use: No  . Sexual activity: Not Currently  Lifestyle  . Physical activity    Days per week: 0 days    Minutes per session: 0 min  . Stress: Only a little  Relationships  . Social connections    Talks on phone: More than three times a week    Gets together: More than three times a week    Attends religious service: More than 4 times per year    Active member of club or organization: Yes    Attends meetings of clubs or organizations: More than 4 times per year    Relationship status: Married  . Intimate partner violence    Fear of current or ex partner: Not on file    Emotionally abused: Not on file    Physically abused: Not on file    Forced sexual activity: Not on file  Other Topics Concern  . Not on file  Social History Narrative  . Not on file    Review of Systems: Gen: Denies any fever, chills, fatigue, weight loss, lack of appetite.  CV: Denies chest pain, heart palpitations, peripheral edema, syncope.  Resp: Denies shortness of breath at rest or with exertion. Denies wheezing or cough.  GI: see HPI GU : Denies urinary burning, urinary frequency, urinary hesitancy MS: +joint pain Derm: Denies rash, itching, dry skin Psych: Denies depression, anxiety, memory loss, and confusion Heme: Denies bruising, bleeding, and enlarged lymph nodes.  Physical Exam: BP (!) 140/94   Pulse 78   Temp (!) 96.2 F (35.7 C) (Temporal)   Ht 4\' 11"  (1.499 m)   Wt 226 lb 9.6 oz (102.8 kg)   LMP 10/06/2013   BMI 45.77 kg/m  General:   Alert and oriented. Pleasant and cooperative. Well-nourished and well-developed.  Head:  Normocephalic and atraumatic. Eyes:  Without icterus, sclera clear and conjunctiva pink.  Ears:  Normal auditory acuity. Lungs:  Clear to auscultation bilaterally. No wheezes, rales, or rhonchi. No distress.  Heart:  S1,  S2 present without murmurs appreciated.  Abdomen:  +BS, soft, non-tender and non-distended. No HSM noted. No guarding or rebound. No masses appreciated.  Rectal:  Deferred  Msk:  Requires cane for ambulation, knee deformities  Extremities:  Without  edema. Neurologic:  Alert and  oriented x4 Psych:  Alert and cooperative. Normal mood and affect.

## 2019-06-19 NOTE — Progress Notes (Signed)
Primary Care Physician:  Dettinger, Fransisca Kaufmann, MD Primary Gastroenterologist:  Dr. Gala Romney   Chief Complaint  Patient presents with  . Colonoscopy    never had tcs  . Diverticulitis    hx, states she was taking a lot of vitamins at the time    HPI:   Rebecca Miles is a 53 y.o. female presenting today at the request of Dr. Warrick Parisian for colonoscopy, with history of diverticulitis in June 2020. CT findings at [facility] with acute sigmoid diverticulitis with 3.7 cm intramural abscess Correlation with colonoscopy history is recommended as an underlying mass is not excluded.   She notes resolution of symptoms. No abdominal pain. BM daily in the morning. Rare constipation. No rectal bleeding or changes in bowel habits. No weight loss or lack of appetite. Denies GERD symptoms. No dysphagia. No prior colonoscopy. Denying fever/chills.   Although Ultram listed in med list, she only takes a few every few months.   Past Medical History:  Diagnosis Date  . Allergy   . Arthritis   . Hypertension   . Hypothyroidism   . Osteoarthritis    ankles and joints  . Rickets     Past Surgical History:  Procedure Laterality Date  . CESAREAN SECTION    . FEMUR IM NAIL  09/26/2012   Procedure: INTRAMEDULLARY (IM) RETROGRADE FEMORAL NAILING;  Surgeon: Gearlean Alf, MD;  Location: [facility];  Service: Orthopedics;  Laterality: Right;  . LEG SURGERY     bilateral at age 74   . TIBIA OSTEOTOMY  09/26/2012   Procedure: TIBIAL OSTEOTOMY;  Surgeon: Gearlean Alf, MD;  Location: [facility];  Service: Orthopedics;  Laterality: Right;  RIGHT FEMORAL CORRECTIVE OSTEOMITY WITH RETROGRADE IM FEMORAL NAIL  . TUBAL LIGATION    . TUBAL LIGATION      Current Outpatient Medications  Medication Sig Dispense Refill  . cetirizine (ZYRTEC) 10 MG tablet Take 1 tablet (10 mg total) by mouth daily. 30 tablet 11  . fluticasone (FLONASE) 50 MCG/ACT nasal spray Place 2 sprays into both nostrils daily. (Patient taking  differently: Place 2 sprays into both nostrils as needed. ) 16 g 6  . Iron-Vitamins (GERITOL COMPLETE PO) Take 1 tablet by mouth daily.    . phosphorus (K PHOS NEUTRAL) 155-852-130 MG tablet Take by mouth 2 (two) times daily.    . traMADol (ULTRAM) 50 MG tablet Take by mouth every 6 (six) hours as needed.    . triamcinolone cream (KENALOG) 0.1 % APPLY TO AFFECTED AREA TWICE A DAY 30 g 0  . valsartan-hydrochlorothiazide (DIOVAN HCT) 160-12.5 MG tablet Take 1 tablet by mouth daily. 90 tablet 3  . levothyroxine (SYNTHROID, LEVOTHROID) 137 MCG tablet TAKE 1 TABLET BY MOUTH DAILY BEFORE BREAKFAST (Patient not taking: Reported on 06/19/2019) 90 tablet 3   No current facility-administered medications for this visit.     Allergies as of 06/19/2019  . (No Known Allergies)    Family History  Problem Relation Age of Onset  . Diabetes Mother   . Hypertension Mother   . Kidney disease Mother   . Parkinson's disease Father   . Lupus Sister   . Drug abuse Brother   . Early death Brother   . Stroke Brother   . Arthritis Maternal Grandmother   . Hypertension Maternal Grandmother   . Kidney disease Maternal Grandmother   . Graves' disease Son   . Myasthenia gravis Son   . Rickets Son   . Rickets Son   .  Rickets Son   . ADD / ADHD Son   . Colon cancer Neg Hx   . Colon polyps Neg Hx     Social History   Socioeconomic History  . Marital status: Married    Spouse name: Not on file  . Number of children: 4  . Years of education: 34  . Highest education level: Some college, no degree  Occupational History  . Occupation: disabled  Social Needs  . Financial resource strain: Not hard at all  . Food insecurity    Worry: Never true    Inability: Never true  . Transportation needs    Medical: No    Non-medical: No  Tobacco Use  . Smoking status: Former Smoker    Packs/day: 0.50    Years: 20.00    Pack years: 10.00    Quit date: 09/12/1999    Years since quitting: 19.8  . Smokeless  tobacco: Never Used  . Tobacco comment: No current tobacco use  Substance and Sexual Activity  . Alcohol use: Yes    Comment: occasional  . Drug use: No  . Sexual activity: Not Currently  Lifestyle  . Physical activity    Days per week: 0 days    Minutes per session: 0 min  . Stress: Only a little  Relationships  . Social connections    Talks on phone: More than three times a week    Gets together: More than three times a week    Attends religious service: More than 4 times per year    Active member of club or organization: Yes    Attends meetings of clubs or organizations: More than 4 times per year    Relationship status: Married  . Intimate partner violence    Fear of current or ex partner: Not on file    Emotionally abused: Not on file    Physically abused: Not on file    Forced sexual activity: Not on file  Other Topics Concern  . Not on file  Social History Narrative  . Not on file    Review of Systems: Gen: Denies any fever, chills, fatigue, weight loss, lack of appetite.  CV: Denies chest pain, heart palpitations, peripheral edema, syncope.  Resp: Denies shortness of breath at rest or with exertion. Denies wheezing or cough.  GI: see HPI GU : Denies urinary burning, urinary frequency, urinary hesitancy MS: +joint pain Derm: Denies rash, itching, dry skin Psych: Denies depression, anxiety, memory loss, and confusion Heme: Denies bruising, bleeding, and enlarged lymph nodes.  Physical Exam: BP (!) 140/94   Pulse 78   Temp (!) 96.2 F (35.7 C) (Temporal)   Ht 4\' 11"  (1.499 m)   Wt 226 lb 9.6 oz (102.8 kg)   LMP 10/06/2013   BMI 45.77 kg/m  General:   Alert and oriented. Pleasant and cooperative. Well-nourished and well-developed.  Head:  Normocephalic and atraumatic. Eyes:  Without icterus, sclera clear and conjunctiva pink.  Ears:  Normal auditory acuity. Lungs:  Clear to auscultation bilaterally. No wheezes, rales, or rhonchi. No distress.  Heart:  S1,  S2 present without murmurs appreciated.  Abdomen:  +BS, soft, non-tender and non-distended. No HSM noted. No guarding or rebound. No masses appreciated.  Rectal:  Deferred  Msk:  Requires cane for ambulation, knee deformities  Extremities:  Without  edema. Neurologic:  Alert and  oriented x4 Psych:  Alert and cooperative. Normal mood and affect.

## 2019-06-19 NOTE — Patient Instructions (Addendum)
We are arranging a colonoscopy with Dr. Gala Romney in the near future.  Stop any iron supplements 7 days before the procedure (Geritol)  Follow a high fiber diet and avoid constipation for good bowel health.  Further recommendations following colonoscopy!  It was a pleasure to see you today. I want to create trusting relationships with patients to provide genuine, compassionate, and quality care. I value your feedback. If you receive a survey regarding your visit,  I greatly appreciate you taking time to fill this out.   Annitta Needs, PhD, ANP-BC [facility]    High-Fiber Diet Fiber, also called dietary fiber, is a type of carbohydrate that is found in fruits, vegetables, whole grains, and beans. A high-fiber diet can have many health benefits. Your health care provider may recommend a high-fiber diet to help:  Prevent constipation. Fiber can make your bowel movements more regular.  Lower your cholesterol.  Relieve the following conditions: ? Swelling of veins in the anus (hemorrhoids). ? Swelling and irritation (inflammation) of specific areas of the digestive tract (uncomplicated diverticulosis). ? A problem of the large intestine (colon) that sometimes causes pain and diarrhea (irritable bowel syndrome, IBS).  Prevent overeating as part of a weight-loss plan.  Prevent heart disease, type 2 diabetes, and certain cancers. What is my plan? The recommended daily fiber intake in grams (g) includes:  38 g for men age 41 or younger.  30 g for men over age 64.  76 g for women age 83 or younger.  21 g for women over age 74. You can get the recommended daily intake of dietary fiber by:  Eating a variety of fruits, vegetables, grains, and beans.  Taking a fiber supplement, if it is not possible to get enough fiber through your diet. What do I need to know about a high-fiber diet?  It is better to get fiber through food sources rather than from fiber supplements.  There is not a lot of research about how effective supplements are.  Always check the fiber content on the nutrition facts label of any prepackaged food. Look for foods that contain 5 g of fiber or more per serving.  Talk with a diet and nutrition specialist (dietitian) if you have questions about specific foods that are recommended or not recommended for your medical condition, especially if those foods are not listed below.  Gradually increase how much fiber you consume. If you increase your intake of dietary fiber too quickly, you may have bloating, cramping, or gas.  Drink plenty of water. Water helps you to digest fiber. What are tips for following this plan?  Eat a wide variety of high-fiber foods.  Make sure that half of the grains that you eat each day are whole grains.  Eat breads and cereals that are made with whole-grain flour instead of refined flour or white flour.  Eat brown rice, bulgur wheat, or millet instead of white rice.  Start the day with a breakfast that is high in fiber, such as a cereal that contains 5 g of fiber or more per serving.  Use beans in place of meat in soups, salads, and pasta dishes.  Eat high-fiber snacks, such as berries, raw vegetables, nuts, and popcorn.  Choose whole fruits and vegetables instead of processed forms like juice or sauce. What foods can I eat?  Fruits Berries. Pears. Apples. Oranges. Avocado. Prunes and raisins. Dried figs. Vegetables Sweet potatoes. Spinach. Kale. Artichokes. Cabbage. Broccoli. Cauliflower. Green peas. Carrots. Squash. Grains Whole-grain breads.  Multigrain cereal. Oats and oatmeal. Brown rice. Barley. Bulgur wheat. [place]. Quinoa. Bran muffins. Popcorn. Rye wafer crackers. Meats and other proteins Navy, kidney, and pinto beans. Soybeans. Split peas. Lentils. Nuts and seeds. Dairy Fiber-fortified yogurt. Beverages Fiber-fortified soy milk. Fiber-fortified orange juice. Other foods Fiber bars. The items  listed above may not be a complete list of recommended foods and beverages. Contact a dietitian for more options. What foods are not recommended? Fruits Fruit juice. Cooked, strained fruit. Vegetables Fried potatoes. Canned vegetables. Well-cooked vegetables. Grains White bread. Pasta made with refined flour. White rice. Meats and other proteins Fatty cuts of meat. Fried chicken or fried fish. Dairy Milk. Yogurt. Cream cheese. Sour cream. Fats and oils Butters. Beverages Soft drinks. Other foods Cakes and pastries. The items listed above may not be a complete list of foods and beverages to avoid. Contact a dietitian for more information. Summary  Fiber is a type of carbohydrate. It is found in fruits, vegetables, whole grains, and beans.  There are many health benefits of eating a high-fiber diet, such as preventing constipation, lowering blood cholesterol, helping with weight loss, and reducing your risk of heart disease, diabetes, and certain cancers.  Gradually increase your intake of fiber. Increasing too fast can result in cramping, bloating, and gas. Drink plenty of water while you increase your fiber.  The best sources of fiber include whole fruits and vegetables, whole grains, nuts, seeds, and beans. This information is not intended to replace advice given to you by your health care provider. Make sure you discuss any questions you have with your health care provider. Document Released: 08/28/2005 Document Revised: 07/02/2017 Document Reviewed: 07/02/2017 Elsevier Patient Education  2020 [employer].

## 2019-06-26 ENCOUNTER — Encounter: Payer: Self-pay | Admitting: Gastroenterology

## 2019-06-26 NOTE — Assessment & Plan Note (Signed)
Very pleasant 53 year old female with history of sigmoid diverticulitis and intramural abscess in [place] June 2020, improved with supportive care and course of antibiotics, presenting now with need for diagnostic colonoscopy. She has had complete resolution of symptoms and doing well. No prior colonoscopy, and she denies any family history of colorectal cancer or polyps. Although Ultram is mentioned in her med list, she only takes sparingly every few months when joint injections have worn off. This is very rare.   Proceed with TCS with Dr. Gala Romney in near future: the risks, benefits, and alternatives have been discussed with the patient in detail. The patient states understanding and desires to proceed.

## 2019-07-14 ENCOUNTER — Telehealth: Payer: Self-pay | Admitting: Internal Medicine

## 2019-07-14 ENCOUNTER — Telehealth: Payer: Self-pay

## 2019-07-14 ENCOUNTER — Other Ambulatory Visit: Payer: Self-pay

## 2019-07-14 ENCOUNTER — Other Ambulatory Visit (HOSPITAL_COMMUNITY)
Admission: RE | Admit: 2019-07-14 | Discharge: 2019-07-14 | Disposition: A | Discharge status: Home / Self Care | Payer: Medicare HMO | Source: Ambulatory Visit | Attending: Internal Medicine | Admitting: Internal Medicine

## 2019-07-14 DIAGNOSIS — Z20828 Contact with and (suspected) exposure to other viral communicable diseases: Secondary | ICD-10-CM | POA: Insufficient documentation

## 2019-07-14 DIAGNOSIS — Z01812 Encounter for preprocedural laboratory examination: Secondary | ICD-10-CM | POA: Diagnosis not present

## 2019-07-14 LAB — SARS CORONAVIRUS 2 (TAT 6-24 HRS): SARS Coronavirus 2: NEGATIVE

## 2019-07-14 NOTE — Telephone Encounter (Signed)
Called pt, TCS for tomorrow moved up to 8:15am. Pt to arrive at 7:15am. Advised her to do enema one hour prior to going to hospital. Pt feels more comfortable doing enema little more than an hour before going to hospital.

## 2019-07-14 NOTE — Telephone Encounter (Signed)
[phone] patient called and said her prep is on back order, needs another one called in

## 2019-07-14 NOTE — Telephone Encounter (Signed)
Called patient. Aware will need to do miralax prep. She advised me to email instructions to her at [email or web address]. I have done so. Also discussed instructions with patient

## 2019-07-15 ENCOUNTER — Ambulatory Visit (HOSPITAL_COMMUNITY)
Admission: RE | Admit: 2019-07-15 | Discharge: 2019-07-15 | Disposition: A | Discharge status: Home / Self Care | Payer: Medicare HMO | Attending: Internal Medicine | Admitting: Internal Medicine

## 2019-07-15 ENCOUNTER — Other Ambulatory Visit: Payer: Self-pay

## 2019-07-15 ENCOUNTER — Encounter (HOSPITAL_COMMUNITY)
Admission: RE | Disposition: A | Discharge status: Home / Self Care | Payer: Self-pay | Source: Home / Self Care | Attending: Internal Medicine

## 2019-07-15 ENCOUNTER — Encounter (HOSPITAL_COMMUNITY): Payer: Self-pay | Admitting: *Deleted

## 2019-07-15 DIAGNOSIS — Z79890 Hormone replacement therapy (postmenopausal): Secondary | ICD-10-CM | POA: Diagnosis not present

## 2019-07-15 DIAGNOSIS — Z79899 Other long term (current) drug therapy: Secondary | ICD-10-CM | POA: Insufficient documentation

## 2019-07-15 DIAGNOSIS — I10 Essential (primary) hypertension: Secondary | ICD-10-CM | POA: Diagnosis not present

## 2019-07-15 DIAGNOSIS — E039 Hypothyroidism, unspecified: Secondary | ICD-10-CM | POA: Insufficient documentation

## 2019-07-15 DIAGNOSIS — D120 Benign neoplasm of cecum: Secondary | ICD-10-CM | POA: Diagnosis not present

## 2019-07-15 DIAGNOSIS — R933 Abnormal findings on diagnostic imaging of other parts of digestive tract: Secondary | ICD-10-CM | POA: Insufficient documentation

## 2019-07-15 DIAGNOSIS — Z87891 Personal history of nicotine dependence: Secondary | ICD-10-CM | POA: Diagnosis not present

## 2019-07-15 DIAGNOSIS — K5730 Diverticulosis of large intestine without perforation or abscess without bleeding: Secondary | ICD-10-CM | POA: Insufficient documentation

## 2019-07-15 DIAGNOSIS — Z8719 Personal history of other diseases of the digestive system: Secondary | ICD-10-CM

## 2019-07-15 DIAGNOSIS — K635 Polyp of colon: Secondary | ICD-10-CM | POA: Diagnosis not present

## 2019-07-15 DIAGNOSIS — M19071 Primary osteoarthritis, right ankle and foot: Secondary | ICD-10-CM | POA: Insufficient documentation

## 2019-07-15 DIAGNOSIS — M19072 Primary osteoarthritis, left ankle and foot: Secondary | ICD-10-CM | POA: Diagnosis not present

## 2019-07-15 HISTORY — PX: COLONOSCOPY: SHX5424

## 2019-07-15 HISTORY — PX: POLYPECTOMY: SHX5525

## 2019-07-15 SURGERY — COLONOSCOPY
Anesthesia: Moderate Sedation

## 2019-07-15 MED ORDER — STERILE WATER FOR IRRIGATION IR SOLN
Status: DC | PRN
  Administered 2019-07-15: 1.5 mL

## 2019-07-15 MED ORDER — MEPERIDINE HCL 50 MG/ML IJ SOLN
INTRAMUSCULAR | Status: AC
  Filled 2019-07-15: qty 1

## 2019-07-15 MED ORDER — ONDANSETRON HCL 4 MG/2ML IJ SOLN
INTRAMUSCULAR | Status: DC | PRN
  Administered 2019-07-15: 4 mg via INTRAVENOUS

## 2019-07-15 MED ORDER — MIDAZOLAM HCL 5 MG/5ML IJ SOLN
INTRAMUSCULAR | Status: AC
  Filled 2019-07-15: qty 10

## 2019-07-15 MED ORDER — ONDANSETRON HCL 4 MG/2ML IJ SOLN
INTRAMUSCULAR | Status: AC
  Filled 2019-07-15: qty 2

## 2019-07-15 MED ORDER — MIDAZOLAM HCL 5 MG/5ML IJ SOLN
INTRAMUSCULAR | Status: DC | PRN
  Administered 2019-07-15 (×4): 1 mg via INTRAVENOUS
  Administered 2019-07-15: 2 mg via INTRAVENOUS

## 2019-07-15 MED ORDER — MEPERIDINE HCL 100 MG/ML IJ SOLN
INTRAMUSCULAR | Status: DC | PRN
  Administered 2019-07-15: 15 mg via INTRAVENOUS
  Administered 2019-07-15: 25 mg via INTRAVENOUS
  Administered 2019-07-15: 10 mg via INTRAVENOUS

## 2019-07-15 MED ORDER — SODIUM CHLORIDE 0.9 % IV SOLN
INTRAVENOUS | Status: DC
  Administered 2019-07-15: 08:00:00 via INTRAVENOUS

## 2019-07-15 NOTE — Interval H&P Note (Signed)
History and Physical Interval Note:  07/15/2019 8:39 AM  Rebecca Miles  has presented today for surgery, with the diagnosis of hx diverticulitis.  The various methods of treatment have been discussed with the patient and family. After consideration of risks, benefits and other options for treatment, the patient has consented to  Procedure(s) with comments: COLONOSCOPY (N/A) - 2:30pm as a surgical intervention.  The patient's history has been reviewed, patient examined, no change in status, stable for surgery.  I have reviewed the patient's chart and labs.  Questions were answered to the patient's satisfaction.     Robert Rourk  No change.  Diagnostic colonoscopy per plan.   The risks, benefits, limitations, alternatives and imponderables have been reviewed with the patient. Questions have been answered. All parties are agreeable.

## 2019-07-15 NOTE — Op Note (Signed)
[facility] Patient Name: Rebecca Miles Procedure Date: 07/15/2019 8:07 AM MRN: [id number] Date of Birth: [birth year] Attending MD: Norvel Richards , MD CSN: [id number] Age: 53 Admit Type: Outpatient Procedure:                Colonoscopy Indications:              Abnormal CT of the GI tract Providers:                Norvel Richards, MD, Charlsie Quest. Theda Sers RN, RN,                            Aram Candela Referring MD:             Fransisca Kaufmann. Dettinger Medicines:                Midazolam 6 mg IV, Meperidine 50 mg IV, Ondansetron                            4 mg IV Complications:            No immediate complications. Estimated Blood Loss:     Estimated blood loss was minimal. Procedure:                Pre-Anesthesia Assessment:                           - Prior to the procedure, a History and Physical                            was performed, and patient medications and                            allergies were reviewed. The patient's tolerance of                            previous anesthesia was also reviewed. The risks                            and benefits of the procedure and the sedation                            options and risks were discussed with the patient.                            All questions were answered, and informed consent                            was obtained. Prior Anticoagulants: The patient has                            taken no previous anticoagulant or antiplatelet                            agents. ASA Grade Assessment: II - A patient with  mild systemic disease. After reviewing the risks                            and benefits, the patient was deemed in                            satisfactory condition to undergo the procedure.                           After obtaining informed consent, the colonoscope                            was passed under direct vision. Throughout the                            procedure,  the patient's blood pressure, pulse, and                            oxygen saturations were monitored continuously. The                            [id number] scope was introduced through                            the anus and advanced to the the cecum, identified                            by appendiceal orifice and ileocecal valve. The                            colonoscopy was performed without difficulty. The                            patient tolerated the procedure well. The quality                            of the bowel preparation was adequate. Scope In: 8:55:51 AM Scope Out: 9:19:08 AM Scope Withdrawal Time: 0 hours 16 minutes 3 seconds  Total Procedure Duration: 0 hours 23 minutes 17 seconds  Findings:      The perianal and digital rectal examinations were normal.      Scattered small and large-mouthed diverticula were found in the cecum.      Three sessile polyps were found in the cecum. The polyps were 4 to 6 mm       in size. These polyps were removed with a cold snare. Resection and       retrieval were complete. Estimated blood loss was minimal.      The exam was otherwise without abnormality on direct and retroflexion       views. Impression:               - Diverticulosis in the cecum.                           - Three 4 to 6 mm polyps in the cecum, removed with  a cold snare. Resected and retrieved.                           - The examination was otherwise normal on direct                            and retroflexion views. Moderate Sedation:      Moderate (conscious) sedation was administered by the endoscopy nurse       and supervised by the endoscopist. The following parameters were       monitored: oxygen saturation, heart rate, blood pressure, respiratory       rate, EKG, adequacy of pulmonary ventilation, and response to care.       Total physician intraservice time was 29 minutes. Recommendation:           - Patient has a  contact number available for                            emergencies. The signs and symptoms of potential                            delayed complications were discussed with the                            patient. Return to normal activities tomorrow.                            Written discharge instructions were provided to the                            patient.                           - Resume previous diet.                           - Continue present medications.                           - Repeat colonoscopy date to be determined after                            pending pathology results are reviewed for                            surveillance.                           - Return to GI office in 6 months. Begin Benefiber                            1 tablespoon daily x3 weeks then increase to twice                            daily thereafter Procedure Code(s):        --- Professional ---  [phone], Colonoscopy, flexible; with removal of                            tumor(s), polyp(s), or other lesion(s) by snare                            technique                           99153, Moderate sedation; each additional 15                            minutes intraservice time                           G0500, Moderate sedation services provided by the                            same physician or other qualified health care                            professional performing a gastrointestinal                            endoscopic service that sedation supports,                            requiring the presence of an independent trained                            observer to assist in the monitoring of the                            patient's level of consciousness and physiological                            status; initial 15 minutes of intra-service time;                            patient age 54 years or older (additional time may                            be  reported with [phone], as appropriate) Diagnosis Code(s):        --- Professional ---                           K63.5, Polyp of colon                           K57.30, Diverticulosis of large intestine without                            perforation or abscess without bleeding                           R93.3, Abnormal  findings on diagnostic imaging of                            other parts of digestive tract CPT copyright 2019 [employer]. All rights reserved. The codes documented in this report are preliminary and upon coder review may  be revised to meet current compliance requirements. Cristopher Estimable. Mena Lienau, MD Norvel Richards, MD 07/15/2019 9:30:22 AM This report has been signed electronically. Number of Addenda: 0

## 2019-07-15 NOTE — Discharge Instructions (Signed)
Diverticulosis  Diverticulosis is a condition that develops when small pouches (diverticula) form in the wall of the large intestine (colon). The colon is where water is absorbed and stool is formed. The pouches form when the inside layer of the colon pushes through weak spots in the outer layers of the colon. You may have a few pouches or many of them. What are the causes? The cause of this condition is not known. What increases the risk? The following factors may make you more likely to develop this condition:  Being older than age 51. Your risk for this condition increases with age. Diverticulosis is rare among people younger than age 51. By age 51, many people have it.  Eating a low-fiber diet.  Having frequent constipation.  Being overweight.  Not getting enough exercise.  Smoking.  Taking over-the-counter pain medicines, like aspirin and ibuprofen.  Having a family history of diverticulosis. What are the signs or symptoms? In most people, there are no symptoms of this condition. If you do have symptoms, they may include:  Bloating.  Cramps in the abdomen.  Constipation or diarrhea.  Pain in the lower left side of the abdomen. How is this diagnosed? This condition is most often diagnosed during an exam for other colon problems. Because diverticulosis usually has no symptoms, it often cannot be diagnosed independently. This condition may be diagnosed by:  Using a flexible scope to examine the colon (colonoscopy).  Taking an X-ray of the colon after dye has been put into the colon (barium enema).  Doing a CT scan. How is this treated? You may not need treatment for this condition if you have never developed an infection related to diverticulosis. If you have had an infection before, treatment may include:  Eating a high-fiber diet. This may include eating more fruits, vegetables, and grains.  Taking a fiber supplement.  Taking a live bacteria supplement  (probiotic).  Taking medicine to relax your colon.  Taking antibiotic medicines. Follow these instructions at home:  Drink 6-8 glasses of water or more each day to prevent constipation.  Try not to strain when you have a bowel movement.  If you have had an infection before: ? Eat more fiber as directed by your health care provider or your diet and nutrition specialist (dietitian). ? Take a fiber supplement or probiotic, if your health care provider approves.  Take over-the-counter and prescription medicines only as told by your health care provider.  If you were prescribed an antibiotic, take it as told by your health care provider. Do not stop taking the antibiotic even if you start to feel better.  Keep all follow-up visits as told by your health care provider. This is important. Contact a health care provider if:  You have pain in your abdomen.  You have bloating.  You have cramps.  You have not had a bowel movement in 3 days. Get help right away if:  Your pain gets worse.  Your bloating becomes very bad.  You have a fever or chills, and your symptoms suddenly get worse.  You vomit.  You have bowel movements that are bloody or black.  You have bleeding from your rectum. Summary  Diverticulosis is a condition that develops when small pouches (diverticula) form in the wall of the large intestine (colon).  You may have a few pouches or many of them.  This condition is most often diagnosed during an exam for other colon problems.  If you have had an infection related to  diverticulosis, treatment may include increasing the fiber in your diet, taking supplements, or taking medicines. This information is not intended to replace advice given to you by your health care provider. Make sure you discuss any questions you have with your health care provider. Document Released: 05/25/2004 Document Revised: 08/10/2017 Document Reviewed: 07/17/2016 Elsevier Patient Education   [place]. Colon Polyps  Polyps are tissue growths inside the body. Polyps can grow in many places, including the large intestine (colon). A polyp may be a round bump or a mushroom-shaped growth. You could have one polyp or several. Most colon polyps are noncancerous (benign). However, some colon polyps can become cancerous over time. Finding and removing the polyps early can help prevent this. What are the causes? The exact cause of colon polyps is not known. What increases the risk? You are more likely to develop this condition if you:  Have a family history of colon cancer or colon polyps.  Are older than 51 or older than 51 if you are African American.  Have inflammatory bowel disease, such as ulcerative colitis or Crohn's disease.  Have certain hereditary conditions, such as: ? Familial adenomatous polyposis. ? Lynch syndrome. ? Turcot syndrome. ? Peutz-Jeghers syndrome.  Are overweight.  Smoke cigarettes.  Do not get enough exercise.  Drink too much alcohol.  Eat a diet that is high in fat and red meat and low in fiber.  Had childhood cancer that was treated with abdominal radiation. What are the signs or symptoms? Most polyps do not cause symptoms. If you have symptoms, they may include:  Blood coming from your rectum when having a bowel movement.  Blood in your stool. The stool may look dark red or black.  Abdominal pain.  A change in bowel habits, such as constipation or diarrhea. How is this diagnosed? This condition is diagnosed with a colonoscopy. This is a procedure in which a lighted, flexible scope is inserted into the anus and then passed into the colon to examine the area. Polyps are sometimes found when a colonoscopy is done as part of routine cancer screening tests. How is this treated? Treatment for this condition involves removing any polyps that are found. Most polyps can be removed during a colonoscopy. Those polyps will then be tested for  cancer. Additional treatment may be needed depending on the results of testing. Follow these instructions at home: Lifestyle  Maintain a healthy weight, or lose weight if recommended by your health care provider.  Exercise every day or as told by your health care provider.  Do not use any products that contain nicotine or tobacco, such as cigarettes and e-cigarettes. If you need help quitting, ask your health care provider.  If you drink alcohol, limit how much you have: ? 0-1 drink a day for women. ? 0-2 drinks a day for men.  Be aware of how much alcohol is in your drink. In the [place] one drink equals one 12 oz bottle of beer (355 mL), one 5 oz glass of wine (148 mL), or one 1 oz shot of hard liquor (44 mL). Eating and drinking   Eat foods that are high in fiber, such as fruits, vegetables, and whole grains.  Eat foods that are high in calcium and vitamin D, such as milk, cheese, yogurt, eggs, liver, fish, and broccoli.  Limit foods that are high in fat, such as fried foods and desserts.  Limit the amount of red meat and processed meat you eat, such as hot dogs,  sausage, bacon, and lunch meats. General instructions  Keep all follow-up visits as told by your health care provider. This is important. ? This includes having regularly scheduled colonoscopies. ? Talk to your health care provider about when you need a colonoscopy. Contact a health care provider if:  You have new or worsening bleeding during a bowel movement.  You have new or increased blood in your stool.  You have a change in bowel habits.  You lose weight for no known reason. Summary  Polyps are tissue growths inside the body. Polyps can grow in many places, including the colon.  Most colon polyps are noncancerous (benign), but some can become cancerous over time.  This condition is diagnosed with a colonoscopy.  Treatment for this condition involves removing any polyps that are found. Most polyps can be  removed during a colonoscopy. This information is not intended to replace advice given to you by your health care provider. Make sure you discuss any questions you have with your health care provider. Document Released: 05/24/2004 Document Revised: 12/13/2017 Document Reviewed: 12/13/2017 Elsevier Patient Education  2020 [place].  Colonoscopy Discharge Instructions  Read the instructions outlined below and refer to this sheet in the next few weeks. These discharge instructions provide you with general information on caring for yourself after you leave the hospital. Your doctor may also give you specific instructions. While your treatment has been planned according to the most current medical practices available, unavoidable complications occasionally occur. If you have any problems or questions after discharge, call Dr. Gala Romney at [phone]. ACTIVITY  You may resume your regular activity, but move at a slower pace for the next 24 hours.   Take frequent rest periods for the next 24 hours.   Walking will help get rid of the air and reduce the bloated feeling in your belly (abdomen).   No driving for 24 hours (because of the medicine (anesthesia) used during the test).    Do not sign any important legal documents or operate any machinery for 24 hours (because of the anesthesia used during the test).  NUTRITION  Drink plenty of fluids.   You may resume your normal diet as instructed by your doctor.   Begin with a light meal and progress to your normal diet. Heavy or fried foods are harder to digest and may make you feel sick to your stomach (nauseated).   Avoid alcoholic beverages for 24 hours or as instructed.  MEDICATIONS  You may resume your normal medications unless your doctor tells you otherwise.  WHAT YOU CAN EXPECT TODAY  Some feelings of bloating in the abdomen.   Passage of more gas than usual.   Spotting of blood in your stool or on the toilet paper.  IF YOU HAD  POLYPS REMOVED DURING THE COLONOSCOPY:  No aspirin products for 7 days or as instructed.   No alcohol for 7 days or as instructed.   Eat a soft diet for the next 24 hours.  FINDING OUT THE RESULTS OF YOUR TEST Not all test results are available during your visit. If your test results are not back during the visit, make an appointment with your caregiver to find out the results. Do not assume everything is normal if you have not heard from your caregiver or the medical facility. It is important for you to follow up on all of your test results.  SEEK IMMEDIATE MEDICAL ATTENTION IF:  You have more than a spotting of blood in your stool.  Your belly is swollen (abdominal distention).   You are nauseated or vomiting.   You have a temperature over 101.   You have abdominal pain or discomfort that is severe or gets worse throughout the day.    Polyp and diverticulosis information provided  Begin Benefiber 1 tablespoon daily x3 weeks; then increase to 1 tablespoon twice daily thereafter-take indefinitely  Further recommendations to follow pending review of pathology report  Office visit with [place] in 6 months  At patient's request, I discussed findings and recommendations with daughter Janett Billow at [phone]

## 2019-07-16 ENCOUNTER — Encounter: Payer: Self-pay | Admitting: Internal Medicine

## 2019-07-16 LAB — SURGICAL PATHOLOGY

## 2019-07-18 ENCOUNTER — Encounter (HOSPITAL_COMMUNITY): Payer: Self-pay | Admitting: Internal Medicine

## 2019-07-18 ENCOUNTER — Telehealth: Payer: Self-pay | Admitting: Family Medicine

## 2019-07-18 NOTE — Telephone Encounter (Signed)
FYI

## 2019-07-21 ENCOUNTER — Telehealth: Payer: Self-pay | Admitting: Gastroenterology

## 2019-07-21 NOTE — Telephone Encounter (Signed)
OPENED IN ERROR

## 2019-07-22 ENCOUNTER — Ambulatory Visit: Payer: 59 | Admitting: Family Medicine

## 2019-08-27 ENCOUNTER — Ambulatory Visit (INDEPENDENT_AMBULATORY_CARE_PROVIDER_SITE_OTHER): Payer: 59 | Admitting: Family Medicine

## 2019-08-27 ENCOUNTER — Encounter: Payer: Self-pay | Admitting: Family Medicine

## 2019-08-27 DIAGNOSIS — K5900 Constipation, unspecified: Secondary | ICD-10-CM

## 2019-08-27 MED ORDER — POLYETHYLENE GLYCOL 3350 17 GM/SCOOP PO POWD: 17.0000 g | Freq: Every day | ORAL | 1 refills | Status: AC

## 2019-08-27 NOTE — Progress Notes (Signed)
Virtual Visit via telephone Note  I connected with Rebecca Miles on 08/27/19 at 1538 by telephone and verified that I am speaking with the correct person using two identifiers. Rebecca Miles is currently located at home and no other people are currently with her during visit. The provider, Fransisca Kaufmann Krystle Polcyn, MD is located in their office at time of visit.  Call ended at 1555  I discussed the limitations, risks, security and privacy concerns of performing an evaluation and management service by telephone and the availability of in person appointments. I also discussed with the patient that there may be a patient responsible charge related to this service. The patient expressed understanding and agreed to proceed.   History and Present Illness: Patient is calling in with complaints of constipation and same symptoms that she had when she had diverticulitis in the past. She started 4 days ago with stomach pain and is taking ibuprofen.  Today is slightly improved.  She took 2 dulcolax and had small BM yestereday. Today she took milk of magnesium and had better bowel movement and is improving since. She has had some nausea but denies any hematochezia.  She had some precancerous polyps from colon last year. She has not changed her diet and is not as active.  She denies fevers or chills. Her BP was 155/87. She has decreased appetite.   No diagnosis found.  Outpatient Encounter Medications as of 08/27/2019  Medication Sig  . acetaminophen (TYLENOL) 650 MG CR tablet Take 1,300 mg by mouth every 8 (eight) hours as needed for pain.  . fluticasone (FLONASE) 50 MCG/ACT nasal spray Place 2 sprays into both nostrils daily. (Patient taking differently: Place 2 sprays into both nostrils as needed for allergies. )  . levothyroxine (SYNTHROID, LEVOTHROID) 137 MCG tablet TAKE 1 TABLET BY MOUTH DAILY BEFORE BREAKFAST (Patient taking differently: Take 137 mcg by mouth daily before breakfast. )  .  Magnesium 250 MG TABS Take 250 mg by mouth daily.  . Multiple Vitamin (MULTIVITAMIN WITH MINERALS) TABS tablet Take 1 tablet by mouth daily.  Marland Kitchen OVER THE COUNTER MEDICATION Take 2 tablets by mouth daily. Keto advanced otc supplement  . phosphorus (K PHOS NEUTRAL) 155-852-130 MG tablet Take 1 mg by mouth 4 (four) times daily - after meals and at bedtime.   . traMADol (ULTRAM) 50 MG tablet Take 50 mg by mouth every 6 (six) hours as needed for moderate pain.   Marland Kitchen triamcinolone cream (KENALOG) 0.1 % APPLY TO AFFECTED AREA TWICE A DAY (Patient taking differently: Apply 1 application topically 2 (two) times daily as needed (rash). )  . valsartan-hydrochlorothiazide (DIOVAN HCT) 160-12.5 MG tablet Take 1 tablet by mouth daily.   No facility-administered encounter medications on file as of 08/27/2019.    Review of Systems  Constitutional: Negative for chills and fever.  Eyes: Negative for redness and visual disturbance.  Respiratory: Negative for chest tightness and shortness of breath.   Cardiovascular: Negative for chest pain and leg swelling.  Gastrointestinal: Positive for abdominal pain, constipation and nausea. Negative for blood in stool, diarrhea and vomiting.  Musculoskeletal: Negative for back pain and gait problem.  Skin: Negative for rash.  Neurological: Negative for light-headedness and headaches.  Psychiatric/Behavioral: Negative for agitation and behavioral problems.  All other systems reviewed and are negative.   Observations/Objective: Patient sounds  Comfortable and in no acute distress  Assessment and Plan: Problem List Items Addressed This Visit    None    Visit Diagnoses  Constipation, unspecified constipation type    -  Primary      Recommended for her to take MiraLAX for at least 5 or 6 days for clearance, based on symptoms think it is more related to constipation and diverticulitis, if she develops any bloody stools or fevers or if it worsens then give [place] a call  back and we can consider treating for diverticulitis. Follow up plan: Return if symptoms worsen or fail to improve.     I discussed the assessment and treatment plan with the patient. The patient was provided an opportunity to ask questions and all were answered. The patient agreed with the plan and demonstrated an understanding of the instructions.   The patient was advised to call back or seek an in-person evaluation if the symptoms worsen or if the condition fails to improve as anticipated.  The above assessment and management plan was discussed with the patient. The patient verbalized understanding of and has agreed to the management plan. Patient is aware to call the clinic if symptoms persist or worsen. Patient is aware when to return to the clinic for a follow-up visit. Patient educated on when it is appropriate to go to the emergency department.    I provided 17 minutes of non-face-to-face time during this encounter.    Worthy Rancher, MD

## 2019-09-03 ENCOUNTER — Inpatient Hospital Stay: Admit: 2019-09-03 | Payer: Managed Care, Other (non HMO) | Admitting: Orthopedic Surgery

## 2019-09-03 SURGERY — INSERTION, INTRAMEDULLARY ROD, FEMUR, RETROGRADE
Anesthesia: Choice | Laterality: Left

## 2019-12-15 ENCOUNTER — Other Ambulatory Visit: Payer: Self-pay | Admitting: *Deleted

## 2019-12-15 ENCOUNTER — Other Ambulatory Visit: Payer: Self-pay | Admitting: Family Medicine

## 2019-12-15 MED ORDER — K PHOS MONO-SOD PHOS DI & MONO 155-852-130 MG PO TABS: 250.0000 mg | ORAL_TABLET | Freq: Three times a day (TID) | ORAL | 2 refills | Status: DC

## 2019-12-22 ENCOUNTER — Telehealth: Payer: Self-pay | Admitting: Family Medicine

## 2019-12-22 NOTE — Telephone Encounter (Signed)
  Prescription Request  12/22/2019  What is the name of the medication or equipment? Olmesartan Medoxomil-20 mg, K-Phos-#2 tablets  Have you contacted your pharmacy to request a refill? (if applicable) Yes  Which pharmacy would you like this sent to? [facility]   Patient notified that their request is being sent to the clinical staff for review and that they should receive a response within 2 business days.   Dettinger's pt.

## 2019-12-22 NOTE — Telephone Encounter (Signed)
Pt states she is not taking the Valsartan-HCTZ 160-12.5, it makes her urinate a lot and she does not have fluid in her legs. She has been taking the Olmesartan Medoxomil 20 mg

## 2019-12-23 MED ORDER — OLMESARTAN MEDOXOMIL 20 MG PO TABS: 20.0000 mg | ORAL_TABLET | Freq: Every day | ORAL | 3 refills | Status: DC

## 2019-12-23 MED ORDER — K PHOS MONO-SOD PHOS DI & MONO 155-852-130 MG PO TABS: 250.0000 mg | ORAL_TABLET | Freq: Three times a day (TID) | ORAL | 2 refills | Status: DC

## 2019-12-23 NOTE — Telephone Encounter (Signed)
I sent olmesartan and potassium, have her watch her blood pressure at home and give [place] some numbers over the next few weeks.

## 2019-12-23 NOTE — Telephone Encounter (Signed)
Patient aware and verbalized understanding. °

## 2019-12-23 NOTE — Telephone Encounter (Signed)
Lmtcb.

## 2020-01-06 ENCOUNTER — Telehealth: Payer: Self-pay | Admitting: Family Medicine

## 2020-01-06 NOTE — Telephone Encounter (Signed)
She is kind of running about 50-50, about 50% look good and about 50% look high, we could increase medication or we could watch a little bit longer I lean towards watching in the little bit longer.

## 2020-01-06 NOTE — Telephone Encounter (Signed)
Spoke with patient. She will continue to monitor BP and will call back next week with an update. Patient is ok not adjusting medication at this time.

## 2020-01-07 DIAGNOSIS — M1712 Unilateral primary osteoarthritis, left knee: Secondary | ICD-10-CM | POA: Diagnosis not present

## 2020-01-07 DIAGNOSIS — Z6841 Body Mass Index (BMI) 40.0 and over, adult: Secondary | ICD-10-CM | POA: Diagnosis not present

## 2020-01-07 DIAGNOSIS — M170 Bilateral primary osteoarthritis of knee: Secondary | ICD-10-CM | POA: Diagnosis not present

## 2020-01-13 ENCOUNTER — Ambulatory Visit: Payer: Medicare HMO | Admitting: Gastroenterology

## 2020-01-13 ENCOUNTER — Encounter: Payer: Self-pay | Admitting: Internal Medicine

## 2020-01-13 ENCOUNTER — Encounter: Payer: Self-pay | Admitting: Gastroenterology

## 2020-01-13 ENCOUNTER — Telehealth: Payer: Self-pay | Admitting: Internal Medicine

## 2020-01-13 NOTE — Telephone Encounter (Signed)
Patient was a no show and letter sent  °

## 2020-01-13 NOTE — Progress Notes (Deleted)
Diverticulosis in cecum, three 4-6 mm polyps s/p removal. Tubular adenomas, sessile serrated polyps without dysplasia. 3 year surveillance.

## 2020-01-26 ENCOUNTER — Other Ambulatory Visit: Payer: Self-pay | Admitting: Family Medicine

## 2020-01-29 ENCOUNTER — Other Ambulatory Visit: Payer: Self-pay | Admitting: *Deleted

## 2020-01-29 NOTE — Telephone Encounter (Signed)
Dettinger. NTBS Last OV for Dx 01/2019

## 2020-01-29 NOTE — Telephone Encounter (Signed)
Pt called and aware of OV need

## 2020-02-17 ENCOUNTER — Other Ambulatory Visit: Payer: Self-pay | Admitting: *Deleted

## 2020-04-26 ENCOUNTER — Other Ambulatory Visit: Payer: Self-pay | Admitting: *Deleted

## 2020-04-27 MED ORDER — TRIAMCINOLONE ACETONIDE 0.1 % EX CREA: TOPICAL_CREAM | Freq: Two times a day (BID) | CUTANEOUS | 1 refills | Status: DC

## 2020-06-21 DIAGNOSIS — Z029 Encounter for administrative examinations, unspecified: Secondary | ICD-10-CM

## 2020-07-03 DIAGNOSIS — R9431 Abnormal electrocardiogram [ECG] [EKG]: Secondary | ICD-10-CM | POA: Diagnosis not present

## 2020-07-03 DIAGNOSIS — I10 Essential (primary) hypertension: Secondary | ICD-10-CM | POA: Diagnosis not present

## 2020-07-03 DIAGNOSIS — Z20822 Contact with and (suspected) exposure to covid-19: Secondary | ICD-10-CM | POA: Diagnosis not present

## 2020-07-03 DIAGNOSIS — R0781 Pleurodynia: Secondary | ICD-10-CM | POA: Diagnosis not present

## 2020-07-03 DIAGNOSIS — J9811 Atelectasis: Secondary | ICD-10-CM | POA: Diagnosis not present

## 2020-07-03 DIAGNOSIS — I493 Ventricular premature depolarization: Secondary | ICD-10-CM | POA: Diagnosis not present

## 2020-07-03 DIAGNOSIS — Z87891 Personal history of nicotine dependence: Secondary | ICD-10-CM | POA: Diagnosis not present

## 2020-07-03 DIAGNOSIS — R079 Chest pain, unspecified: Secondary | ICD-10-CM | POA: Diagnosis not present

## 2020-07-03 DIAGNOSIS — R072 Precordial pain: Secondary | ICD-10-CM | POA: Diagnosis not present

## 2020-07-03 DIAGNOSIS — R791 Abnormal coagulation profile: Secondary | ICD-10-CM | POA: Diagnosis not present

## 2020-07-08 ENCOUNTER — Encounter: Payer: Self-pay | Admitting: Family Medicine

## 2020-07-08 ENCOUNTER — Other Ambulatory Visit: Payer: Self-pay

## 2020-07-08 ENCOUNTER — Ambulatory Visit (INDEPENDENT_AMBULATORY_CARE_PROVIDER_SITE_OTHER): Payer: Medicare HMO | Admitting: Family Medicine

## 2020-07-08 VITALS — BP 106/72 | HR 97 | Temp 98.0°F | Ht 59.0 in | Wt 229.0 lb

## 2020-07-08 DIAGNOSIS — Z23 Encounter for immunization: Secondary | ICD-10-CM

## 2020-07-08 DIAGNOSIS — E039 Hypothyroidism, unspecified: Secondary | ICD-10-CM | POA: Diagnosis not present

## 2020-07-08 DIAGNOSIS — M174 Other bilateral secondary osteoarthritis of knee: Secondary | ICD-10-CM

## 2020-07-08 DIAGNOSIS — R7303 Prediabetes: Secondary | ICD-10-CM

## 2020-07-08 DIAGNOSIS — I10 Essential (primary) hypertension: Secondary | ICD-10-CM | POA: Diagnosis not present

## 2020-07-08 DIAGNOSIS — Z1231 Encounter for screening mammogram for malignant neoplasm of breast: Secondary | ICD-10-CM | POA: Diagnosis not present

## 2020-07-08 LAB — BAYER DCA HB A1C WAIVED: HB A1C (BAYER DCA - WAIVED): 5.4 % (ref <7.0)

## 2020-07-08 MED ORDER — METHYLPREDNISOLONE ACETATE 80 MG/ML IJ SUSP
80.0000 mg | Freq: Once | INTRAMUSCULAR | Status: AC
  Administered 2020-07-08: 80 mg via INTRA_ARTICULAR

## 2020-07-08 NOTE — Progress Notes (Signed)
BP 106/72   Pulse 97   Temp 98 F (36.7 C)   Ht _0  (1.499 m)   Wt 229 lb (103.9 kg)   LMP 10/06/2013   SpO2 96%   BMI 46.25 kg/m    Subjective:   Patient ID: Rebecca Miles, female    DOB: [birth year], 54 y.o.   MRN: [id number]  HPI: Rebecca Miles is a 54 y.o. female presenting on 07/08/2020 for Medical Management of Chronic Issues   HPI Prediabetes Patient comes in today for recheck of his diabetes. Patient has been currently taking no medication currently monitoring has been diet controlled. Patient is currently on an ACE inhibitor/ARB. Patient has seen an ophthalmologist this year. Patient denies any issues with their feet. The symptom started onset as an adult hypertension and hypothyroidism ARE RELATED TO DM   Hypothyroidism recheck Patient is coming in for thyroid recheck today as well. They deny any issues with hair changes or heat or cold problems or diarrhea or constipation. They deny any chest pain or palpitations. They are currently on levothyroxine 137 micrograms   Hypertension Patient is currently on valsartan, and their blood pressure today is 106/72. Patient denies any lightheadedness or dizziness. Patient denies headaches, blurred vision, chest pains, shortness of breath, or weakness. Denies any side effects from medication and is content with current medication.   Knee osteoarthritis bilateral Patient comes for bilateral knee osteoarthritis.  A lot of this stems from her records which is led to deformities in both of her knees and hips.  She has had injections previously and most recent was about 8 months ago and she would like to do injections again today.  They did seem to help for a long period of time.  They started hurting her again a couple weeks ago  Relevant past medical, surgical, family and social history reviewed and updated as indicated. Interim medical history since our last visit reviewed. Allergies and medications reviewed and  updated.  Review of Systems  Constitutional: Negative for chills and fever.  Eyes: Negative for visual disturbance.  Respiratory: Negative for chest tightness and shortness of breath.   Cardiovascular: Negative for chest pain and leg swelling.  Musculoskeletal: Positive for arthralgias. Negative for back pain, gait problem and myalgias.  Skin: Negative for rash.  Neurological: Negative for light-headedness and headaches.  Psychiatric/Behavioral: Negative for agitation and behavioral problems.  All other systems reviewed and are negative.   Per HPI unless specifically indicated above   Allergies as of 07/08/2020   No Known Allergies     Medication List       Accurate as of July 08, 2020 11:31 AM. If you have any questions, ask your nurse or doctor.        acetaminophen 650 MG CR tablet Commonly known as: TYLENOL Take 1,300 mg by mouth every 8 (eight) hours as needed for pain.   fluticasone 50 MCG/ACT nasal spray Commonly known as: FLONASE Place 2 sprays into both nostrils daily. What changed:   when to take this  reasons to take this   levothyroxine 137 MCG tablet Commonly known as: SYNTHROID TAKE 1 TABLET BY MOUTH DAILY BEFORE BREAKFAST What changed:   how much to take  how to take this  when to take this  additional instructions   Magnesium 250 MG Tabs Take 250 mg by mouth daily.   multivitamin with minerals Tabs tablet Take 1 tablet by mouth daily.   olmesartan 20 MG tablet Commonly known as: [employer]  Take 1 tablet (20 mg total) by mouth daily.   OVER THE COUNTER MEDICATION Take 2 tablets by mouth daily. Keto advanced otc supplement   phosphorus 155-852-130 MG tablet Commonly known as: K PHOS NEUTRAL Take 1 tablet (250 mg total) by mouth 4 (four) times daily - after meals and at bedtime.   polyethylene glycol powder 17 GM/SCOOP powder Commonly known as: GLYCOLAX/MIRALAX Take 17 g by mouth daily.   traMADol 50 MG tablet Commonly known as:  ULTRAM Take 50 mg by mouth every 6 (six) hours as needed for moderate pain.   triamcinolone cream 0.1 % Commonly known as: KENALOG Apply topically 2 (two) times daily.   valsartan 40 MG tablet Commonly known as: DIOVAN Take 40 mg by mouth daily.        Objective:   BP 106/72   Pulse 97   Temp 98 F (36.7 C)   Ht _0  (1.499 m)   Wt 229 lb (103.9 kg)   LMP 10/06/2013   SpO2 96%   BMI 46.25 kg/m   Wt Readings from Last 3 Encounters:  07/08/20 229 lb (103.9 kg)  07/15/19 220 lb (99.8 kg)  06/19/19 226 lb 9.6 oz (102.8 kg)    Physical Exam Vitals and nursing note reviewed.  Constitutional:      General: She is not in acute distress.    Appearance: She is well-developed. She is not diaphoretic.  Eyes:     Conjunctiva/sclera: Conjunctivae normal.  Cardiovascular:     Rate and Rhythm: Normal rate and regular rhythm.     Heart sounds: Normal heart sounds. No murmur heard.   Pulmonary:     Effort: Pulmonary effort is normal. No respiratory distress.     Breath sounds: Normal breath sounds. No wheezing.  Skin:    General: Skin is warm and dry.     Findings: No rash.  Neurological:     Mental Status: She is alert and oriented to person, place, and time.     Coordination: Coordination normal.  Psychiatric:        Behavior: Behavior normal.     Knee injection x2: Consent form signed. Risk factors of bleeding and infection discussed with patient and patient is agreeable towards injection. Patient prepped with Betadine. Lateral approach towards injection used. Injected 80 mg of Depo-Medrol and 1 mL of 2% lidocaine. Patient tolerated procedure well and no side effects from noted. Minimal to no bleeding. Simple bandage applied after.   Assessment & Plan:   Problem List Items Addressed This Visit      Cardiovascular and Mediastinum   Hypertension   Relevant Medications   pravastatin (PRAVACHOL) 20 MG tablet   Other Relevant Orders   CBC with Differential/Platelet  (Completed)   CMP14+EGFR (Completed)   Lipid panel (Completed)     Endocrine   Hypothyroidism   Relevant Orders   Lipid panel (Completed)   TSH (Completed)     Musculoskeletal and Integument   OA (osteoarthritis) of knee     Other   Prediabetes - Primary   Relevant Orders   Bayer DCA Hb A1c Waived (Completed)   Lipid panel (Completed)    Other Visit Diagnoses    Encounter for screening mammogram for malignant neoplasm of breast       Relevant Orders   MM 3D SCREEN BREAST BILATERAL   Need for immunization against influenza       Relevant Orders   Flu Vaccine QUAD 36+ mos IM (Completed)  Continue current medication.  We will recheck thyroid, blood pressure is on the lower side but asymptomatic, will continue to monitor. Follow up plan: Return in about 6 months (around 01/06/2021), or if symptoms worsen or fail to improve, for Prediabetes and hypertension and thyroid.  Counseling provided for all of the vaccine components No orders of the defined types were placed in this encounter.   Caryl Pina, MD [facility] 07/08/2020, 11:31 AM

## 2020-07-09 ENCOUNTER — Telehealth: Payer: Self-pay

## 2020-07-09 LAB — CBC WITH DIFFERENTIAL/PLATELET
Basophils Absolute: 0.1 10*3/uL (ref 0.0–0.2)
Basos: 1 %
EOS (ABSOLUTE): 0.1 10*3/uL (ref 0.0–0.4)
Eos: 1 %
Hematocrit: 45.1 % (ref 34.0–46.6)
Hemoglobin: 14.6 g/dL (ref 11.1–15.9)
Immature Grans (Abs): 0 10*3/uL (ref 0.0–0.1)
Immature Granulocytes: 0 %
Lymphocytes Absolute: 3.1 10*3/uL (ref 0.7–3.1)
Lymphs: 29 %
MCH: 29.7 pg (ref 26.6–33.0)
MCHC: 32.4 g/dL (ref 31.5–35.7)
MCV: 92 fL (ref 79–97)
Monocytes Absolute: 0.8 10*3/uL (ref 0.1–0.9)
Monocytes: 8 %
Neutrophils Absolute: 6.8 10*3/uL (ref 1.4–7.0)
Neutrophils: 61 %
Platelets: 433 10*3/uL (ref 150–450)
RBC: 4.92 x10E6/uL (ref 3.77–5.28)
RDW: 13.3 % (ref 11.7–15.4)
WBC: 11 10*3/uL — ABNORMAL HIGH (ref 3.4–10.8)

## 2020-07-09 LAB — CMP14+EGFR
ALT: 17 IU/L (ref 0–32)
AST: 16 IU/L (ref 0–40)
Albumin/Globulin Ratio: 1.6 (ref 1.2–2.2)
Albumin: 4.2 g/dL (ref 3.8–4.9)
Alkaline Phosphatase: 129 IU/L — ABNORMAL HIGH (ref 44–121)
BUN/Creatinine Ratio: 11 (ref 9–23)
BUN: 8 mg/dL (ref 6–24)
Bilirubin Total: 0.4 mg/dL (ref 0.0–1.2)
CO2: 26 mmol/L (ref 20–29)
Calcium: 9.7 mg/dL (ref 8.7–10.2)
Chloride: 102 mmol/L (ref 96–106)
Creatinine, Ser: 0.71 mg/dL (ref 0.57–1.00)
GFR calc Af Amer: 113 mL/min/{1.73_m2} (ref >59)
GFR calc non Af Amer: 98 mL/min/{1.73_m2} (ref >59)
Globulin, Total: 2.6 g/dL (ref 1.5–4.5)
Glucose: 83 mg/dL (ref 65–99)
Potassium: 4 mmol/L (ref 3.5–5.2)
Sodium: 141 mmol/L (ref 134–144)
Total Protein: 6.8 g/dL (ref 6.0–8.5)

## 2020-07-09 LAB — TSH: TSH: 7.31 u[IU]/mL — ABNORMAL HIGH (ref 0.450–4.500)

## 2020-07-09 LAB — LIPID PANEL
Chol/HDL Ratio: 3.9 ratio (ref 0.0–4.4)
Cholesterol, Total: 185 mg/dL (ref 100–199)
HDL: 48 mg/dL (ref >39)
LDL Chol Calc (NIH): 114 mg/dL — ABNORMAL HIGH (ref 0–99)
Triglycerides: 127 mg/dL (ref 0–149)
VLDL Cholesterol Cal: 23 mg/dL (ref 5–40)

## 2020-07-09 MED ORDER — VALSARTAN 160 MG PO TABS: 160.0000 mg | ORAL_TABLET | Freq: Every day | ORAL | 3 refills | Status: DC

## 2020-07-09 NOTE — Telephone Encounter (Signed)
Pt returned missed call regarding medication issue. Reviewed Dr Neldon Mc note with pt regarding Valsartan without the HCTZ being sent to the pharmacy. Pt voiced understanding.

## 2020-07-09 NOTE — Telephone Encounter (Signed)
Lmtcb.

## 2020-07-09 NOTE — Telephone Encounter (Signed)
Please let the patient know that I sent in valsartan 160 without the hydrochlorothiazide so she does not have to urinate all of the time.

## 2020-07-09 NOTE — Telephone Encounter (Signed)
DR D - FYI, had OV yesterday.

## 2020-07-14 ENCOUNTER — Other Ambulatory Visit: Payer: Self-pay | Admitting: Family Medicine

## 2020-07-14 MED ORDER — PRAVASTATIN SODIUM 20 MG PO TABS: 20.0000 mg | ORAL_TABLET | Freq: Every day | ORAL | 1 refills | Status: DC

## 2020-10-12 ENCOUNTER — Other Ambulatory Visit: Payer: Self-pay | Admitting: Family Medicine

## 2020-11-26 ENCOUNTER — Encounter: Payer: Self-pay | Admitting: Family Medicine

## 2020-11-26 ENCOUNTER — Other Ambulatory Visit: Payer: Self-pay

## 2020-11-26 ENCOUNTER — Ambulatory Visit (INDEPENDENT_AMBULATORY_CARE_PROVIDER_SITE_OTHER): Payer: Medicare HMO | Admitting: Family Medicine

## 2020-11-26 VITALS — BP 141/77 | HR 88 | Ht 59.0 in | Wt 227.0 lb

## 2020-11-26 DIAGNOSIS — Z6841 Body Mass Index (BMI) 40.0 and over, adult: Secondary | ICD-10-CM | POA: Diagnosis not present

## 2020-11-26 DIAGNOSIS — M174 Other bilateral secondary osteoarthritis of knee: Secondary | ICD-10-CM

## 2020-11-26 MED ORDER — METHYLPREDNISOLONE ACETATE 80 MG/ML IJ SUSP
80.0000 mg | Freq: Once | INTRAMUSCULAR | Status: AC
  Administered 2020-11-26: 80 mg via INTRAMUSCULAR

## 2020-11-26 NOTE — Progress Notes (Signed)
BP (!) 141/77   Pulse 88   Ht 4\' 11"  (1.499 m)   Wt 227 lb (103 kg)   LMP 10/06/2013   SpO2 97%   BMI 45.85 kg/m    Subjective:   Patient ID: Rebecca Miles, female    DOB: [birth year], 55 y.o.   MRN: [id number]  HPI: Rebecca Miles is a 55 y.o. female presenting on 11/26/2020 for Knee Pain (Bilat. Wants injection.)   HPI Knee pain/osteoarthritis Patient has a history of rickets and osteoarthritis in the hard Q angle.  She has been getting injections to help delay possible surgery in the future.  She would like to get injections again today.  Is been swelling up and hurting her more over the past month.  Relevant past medical, surgical, family and social history reviewed and updated as indicated. Interim medical history since our last visit reviewed. Allergies and medications reviewed and updated.  Review of Systems  Constitutional: Negative for activity change, fever and unexpected weight change.  Musculoskeletal: Positive for arthralgias, gait problem and joint swelling.  Skin: Negative for color change.    Per HPI unless specifically indicated above   Allergies as of 11/26/2020   No Known Allergies     Medication List       Accurate as of November 26, 2020  9:43 AM. If you have any questions, ask your nurse or doctor.        acetaminophen 650 MG CR tablet Commonly known as: TYLENOL Take 1,300 mg by mouth every 8 (eight) hours as needed for pain.   fluticasone 50 MCG/ACT nasal spray Commonly known as: FLONASE Place 2 sprays into both nostrils daily. What changed:   when to take this  reasons to take this   levothyroxine 137 MCG tablet Commonly known as: SYNTHROID TAKE 1 TABLET BY MOUTH EVERY DAY BEFORE BREAKFAST   Magnesium 250 MG Tabs Take 250 mg by mouth daily.   multivitamin with minerals Tabs tablet Take 1 tablet by mouth daily.   OVER THE COUNTER MEDICATION Take 2 tablets by mouth daily. Keto advanced otc supplement   phosphorus  155-852-130 MG tablet Commonly known as: K PHOS NEUTRAL Take 1 tablet (250 mg total) by mouth 4 (four) times daily - after meals and at bedtime.   polyethylene glycol powder 17 GM/SCOOP powder Commonly known as: GLYCOLAX/MIRALAX Take 17 g by mouth daily.   pravastatin 20 MG tablet Commonly known as: PRAVACHOL Take 1 tablet (20 mg total) by mouth daily.   traMADol 50 MG tablet Commonly known as: ULTRAM Take 50 mg by mouth every 6 (six) hours as needed for moderate pain.   triamcinolone 0.1 % Commonly known as: KENALOG APPLY TO AFFECTED AREA TWICE A DAY   valsartan 160 MG tablet Commonly known as: Diovan Take 1 tablet (160 mg total) by mouth daily.        Objective:   BP (!) 141/77   Pulse 88   Ht 4\' 11"  (1.499 m)   Wt 227 lb (103 kg)   LMP 10/06/2013   SpO2 97%   BMI 45.85 kg/m   Wt Readings from Last 3 Encounters:  11/26/20 227 lb (103 kg)  07/08/20 229 lb (103.9 kg)  07/15/19 220 lb (99.8 kg)    Physical Exam Vitals and nursing note reviewed.  Constitutional:      Appearance: Normal appearance.  Musculoskeletal:     Right knee: Tenderness present over the medial joint line and lateral joint line.  Left knee: Tenderness present over the medial joint line and lateral joint line.  Neurological:     Mental Status: She is alert.     Knee injection x2: Consent form signed. Risk factors of bleeding and infection discussed with patient and patient is agreeable towards injection. Patient prepped with Betadine. Lateral approach towards injection used. Injected 80 mg of Depo-Medrol and 1 mL of 2% lidocaine. Patient tolerated procedure well and no side effects from noted. Minimal to no bleeding. Simple bandage applied after.   Assessment & Plan:   Problem List Items Addressed This Visit      Musculoskeletal and Integument   OA (osteoarthritis) of knee - Primary   Relevant Medications   methylPREDNISolone acetate (DEPO-MEDROL) injection 80 mg (Start on 11/26/2020   9:45 AM)   methylPREDNISolone acetate (DEPO-MEDROL) injection 80 mg (Start on 11/26/2020  9:45 AM)      Follow-up soon for her usual appointment for thyroid Follow up plan: Return if symptoms worsen or fail to improve.  Counseling provided for all of the vaccine components No orders of the defined types were placed in this encounter.   Caryl Pina, MD [facility] 11/26/2020, 9:43 AM

## 2020-12-01 ENCOUNTER — Other Ambulatory Visit: Payer: Self-pay

## 2020-12-01 ENCOUNTER — Telehealth: Payer: Self-pay

## 2020-12-01 ENCOUNTER — Other Ambulatory Visit (HOSPITAL_COMMUNITY)
Admission: RE | Admit: 2020-12-01 | Discharge: 2020-12-01 | Disposition: A | Discharge status: Home / Self Care | Payer: Medicare HMO | Source: Ambulatory Visit | Attending: Family Medicine | Admitting: Family Medicine

## 2020-12-01 ENCOUNTER — Encounter: Payer: Self-pay | Admitting: Family Medicine

## 2020-12-01 ENCOUNTER — Ambulatory Visit (INDEPENDENT_AMBULATORY_CARE_PROVIDER_SITE_OTHER): Payer: Medicare HMO | Admitting: Family Medicine

## 2020-12-01 VITALS — BP 139/88 | HR 77 | Ht 59.0 in | Wt 227.0 lb

## 2020-12-01 DIAGNOSIS — Z6841 Body Mass Index (BMI) 40.0 and over, adult: Secondary | ICD-10-CM | POA: Diagnosis not present

## 2020-12-01 DIAGNOSIS — Z01411 Encounter for gynecological examination (general) (routine) with abnormal findings: Secondary | ICD-10-CM | POA: Diagnosis not present

## 2020-12-01 DIAGNOSIS — E039 Hypothyroidism, unspecified: Secondary | ICD-10-CM | POA: Diagnosis not present

## 2020-12-01 DIAGNOSIS — N814 Uterovaginal prolapse, unspecified: Secondary | ICD-10-CM | POA: Diagnosis not present

## 2020-12-01 DIAGNOSIS — R7303 Prediabetes: Secondary | ICD-10-CM

## 2020-12-01 DIAGNOSIS — Z1231 Encounter for screening mammogram for malignant neoplasm of breast: Secondary | ICD-10-CM

## 2020-12-01 DIAGNOSIS — I10 Essential (primary) hypertension: Secondary | ICD-10-CM | POA: Diagnosis not present

## 2020-12-01 DIAGNOSIS — Z01419 Encounter for gynecological examination (general) (routine) without abnormal findings: Secondary | ICD-10-CM

## 2020-12-01 LAB — BAYER DCA HB A1C WAIVED: HB A1C (BAYER DCA - WAIVED): 5.4 % (ref <7.0)

## 2020-12-01 NOTE — Progress Notes (Signed)
BP 139/88   Pulse 77   Ht _0  (1.499 m)   Wt 227 lb (103 kg)   LMP 10/06/2013   SpO2 98%   BMI 45.85 kg/m    Subjective:   Patient ID: Rebecca Miles, female    DOB: 01/13/1966, 55 y.o.   MRN: [id number]  HPI: [name] is a 55 y.o. female presenting on 12/01/2020 for Medical Management of Chronic Issues (CPE)   HPI Well woman exam and physical Patient is coming in today for well woman exam and physical and recheck of chronic medical issues.  She does complain of some urinary incontinence and leakage and she has to wear pads at times. Patient denies any chest pain, shortness of breath, headaches or vision issues, abdominal complaints, diarrhea, nausea, vomiting, or joint issues.   Hypothyroidism recheck Patient is coming in for thyroid recheck today as well. They deny any issues with hair changes or heat or cold problems or diarrhea or constipation. They deny any chest pain or palpitations. They are currently on levothyroxine 137 micrograms   Hypertension Patient is currently on hydrochlorothiazide as needed and olmesartan, she thinks 20 mg., and their blood pressure today is 139/88. Patient denies any lightheadedness or dizziness. Patient denies headaches, blurred vision, chest pains, shortness of breath, or weakness. Denies any side effects from medication and is content with current medication.   Prediabetes Patient comes in today for recheck of his diabetes. Patient has been currently taking no medication currently has been diet controlled. Patient is currently on an ACE inhibitor/ARB. Patient has not seen an ophthalmologist this year. Patient denies any issues with their feet. The symptom started onset as an adult hypertension ARE RELATED TO DM   Relevant past medical, surgical, family and social history reviewed and updated as indicated. Interim medical history since our last visit reviewed. Allergies and medications reviewed and updated.  Review of Systems   Constitutional: Negative for chills and fever.  HENT: Negative for congestion, ear discharge and ear pain.   Eyes: Negative for redness and visual disturbance.  Respiratory: Negative for chest tightness and shortness of breath.   Cardiovascular: Negative for chest pain and leg swelling.  Genitourinary: Positive for urgency. Negative for difficulty urinating, dysuria, frequency and hematuria.  Musculoskeletal: Negative for back pain and gait problem.  Skin: Negative for rash.  Neurological: Negative for light-headedness and headaches.  Psychiatric/Behavioral: Negative for agitation and behavioral problems.  All other systems reviewed and are negative.   Per HPI unless specifically indicated above   Allergies as of 12/01/2020   No Known Allergies     Medication List       Accurate as of December 01, 2020  3:03 PM. If you have any questions, ask your nurse or doctor.        STOP taking these medications   OVER THE COUNTER MEDICATION Stopped by: Fransisca Kaufmann Dettinger, MD   valsartan 160 MG tablet Commonly known as: Diovan Stopped by: Fransisca Kaufmann Dettinger, MD     TAKE these medications   acetaminophen 650 MG CR tablet Commonly known as: TYLENOL Take 1,300 mg by mouth every 8 (eight) hours as needed for pain.   fluticasone 50 MCG/ACT nasal spray Commonly known as: FLONASE Place 2 sprays into both nostrils daily. What changed:   when to take this  reasons to take this   hydrochlorothiazide 12.5 MG capsule Commonly known as: MICROZIDE Take 12.5 mg by mouth daily.   levothyroxine 137 MCG tablet Commonly known  as: SYNTHROID TAKE 1 TABLET BY MOUTH EVERY DAY BEFORE BREAKFAST   Magnesium 250 MG Tabs Take 250 mg by mouth daily.   multivitamin with minerals Tabs tablet Take 1 tablet by mouth daily.   olmesartan 20 MG tablet Commonly known as: BENICAR Take 20 mg by mouth daily.   phosphorus 155-852-130 MG tablet Commonly known as: K PHOS NEUTRAL Take 1 tablet (250 mg  total) by mouth 4 (four) times daily - after meals and at bedtime.   polyethylene glycol powder 17 GM/SCOOP powder Commonly known as: GLYCOLAX/MIRALAX Take 17 g by mouth daily.   pravastatin 20 MG tablet Commonly known as: PRAVACHOL Take 1 tablet (20 mg total) by mouth daily.   traMADol 50 MG tablet Commonly known as: ULTRAM Take 50 mg by mouth every 6 (six) hours as needed for moderate pain.   triamcinolone 0.1 % Commonly known as: [employer] APPLY TO AFFECTED AREA TWICE A DAY        Objective:   BP 139/88   Pulse 77   Ht _0  (1.499 m)   Wt 227 lb (103 kg)   LMP 10/06/2013   SpO2 98%   BMI 45.85 kg/m   Wt Readings from Last 3 Encounters:  12/01/20 227 lb (103 kg)  11/26/20 227 lb (103 kg)  07/08/20 229 lb (103.9 kg)    Physical Exam Vitals and nursing note reviewed.  Constitutional:      General: She is not in acute distress.    Appearance: She is well-developed. She is not diaphoretic.  Eyes:     Conjunctiva/sclera: Conjunctivae normal.  Cardiovascular:     Rate and Rhythm: Normal rate and regular rhythm.     Heart sounds: Normal heart sounds. No murmur heard.   Pulmonary:     Effort: Pulmonary effort is normal. No respiratory distress.     Breath sounds: Normal breath sounds. No wheezing.  Musculoskeletal:        General: No tenderness. Normal range of motion.  Skin:    General: Skin is warm and dry.     Findings: No rash.  Neurological:     Mental Status: She is alert and oriented to person, place, and time.     Coordination: Coordination normal.  Psychiatric:        Behavior: Behavior normal.       Assessment & Plan:   Problem List Items Addressed This Visit      Cardiovascular and Mediastinum   Hypertension   Relevant Medications   hydrochlorothiazide (MICROZIDE) 12.5 MG capsule   olmesartan (BENICAR) 20 MG tablet   Other Relevant Orders   CBC with Differential/Platelet   CMP14+EGFR   Lipid panel     Endocrine   Hypothyroidism    Relevant Orders   CBC with Differential/Platelet   TSH     Other   Prediabetes   Relevant Orders   CBC with Differential/Platelet   CMP14+EGFR   Lipid panel   Bayer DCA Hb A1c Waived    Other Visit Diagnoses    Well woman exam with routine gynecological exam    -  Primary   Relevant Orders   CBC with Differential/Platelet   CMP14+EGFR   Lipid panel   Cytology - PAP([place])   Encounter for screening mammogram for malignant neoplasm of breast       Relevant Orders   MM 3D SCREEN BREAST BILATERAL   Vaginal and cervical prolapse          Discussed options for vaginal  prolapse of surgery versus pessary versus therapy, she wants to think on it.  No change in medication, will check blood work today. Follow up plan: Return in about 3 months (around 03/03/2021), or if symptoms worsen or fail to improve, for Thyroid and prediabetes recheck.  Counseling provided for all of the vaccine components Orders Placed This Encounter  Procedures  . MM 3D SCREEN BREAST BILATERAL  . CBC with Differential/Platelet  . CMP14+EGFR  . Lipid panel  . TSH  . Bayer [facility] Hb A1c [place], MD [facility] 12/01/2020, 3:03 PM

## 2020-12-01 NOTE — Telephone Encounter (Signed)
Pt called stating that she spoke with Dr Dettinger at her visit today about getting a cervical prolapse. Pt says she has some more questions about it and would like to speak with Dr Dettinger or nurse.

## 2020-12-02 LAB — CBC WITH DIFFERENTIAL/PLATELET
Basophils Absolute: 0.1 10*3/uL (ref 0.0–0.2)
Basos: 0 %
EOS (ABSOLUTE): 0.1 10*3/uL (ref 0.0–0.4)
Eos: 1 %
Hematocrit: 43.5 % (ref 34.0–46.6)
Hemoglobin: 14.8 g/dL (ref 11.1–15.9)
Immature Grans (Abs): 0.1 10*3/uL (ref 0.0–0.1)
Immature Granulocytes: 1 %
Lymphocytes Absolute: 3.6 10*3/uL — ABNORMAL HIGH (ref 0.7–3.1)
Lymphs: 21 %
MCH: 31 pg (ref 26.6–33.0)
MCHC: 34 g/dL (ref 31.5–35.7)
MCV: 91 fL (ref 79–97)
Monocytes Absolute: 1.2 10*3/uL — ABNORMAL HIGH (ref 0.1–0.9)
Monocytes: 7 %
Neutrophils Absolute: 12 10*3/uL — ABNORMAL HIGH (ref 1.4–7.0)
Neutrophils: 70 %
Platelets: 407 10*3/uL (ref 150–450)
RBC: 4.77 x10E6/uL (ref 3.77–5.28)
RDW: 13.1 % (ref 11.7–15.4)
WBC: 17 10*3/uL — ABNORMAL HIGH (ref 3.4–10.8)

## 2020-12-02 LAB — CMP14+EGFR
ALT: 12 IU/L (ref 0–32)
AST: 18 IU/L (ref 0–40)
Albumin/Globulin Ratio: 1.8 (ref 1.2–2.2)
Albumin: 4.4 g/dL (ref 3.8–4.9)
Alkaline Phosphatase: 138 IU/L — ABNORMAL HIGH (ref 44–121)
BUN/Creatinine Ratio: 17 (ref 9–23)
BUN: 11 mg/dL (ref 6–24)
Bilirubin Total: 0.3 mg/dL (ref 0.0–1.2)
CO2: 20 mmol/L (ref 20–29)
Calcium: 9.7 mg/dL (ref 8.7–10.2)
Chloride: 101 mmol/L (ref 96–106)
Creatinine, Ser: 0.63 mg/dL (ref 0.57–1.00)
Globulin, Total: 2.5 g/dL (ref 1.5–4.5)
Glucose: 81 mg/dL (ref 65–99)
Potassium: 4.2 mmol/L (ref 3.5–5.2)
Sodium: 145 mmol/L — ABNORMAL HIGH (ref 134–144)
Total Protein: 6.9 g/dL (ref 6.0–8.5)
eGFR: 106 mL/min/{1.73_m2} (ref >59)

## 2020-12-02 LAB — LIPID PANEL
Chol/HDL Ratio: 2.8 ratio (ref 0.0–4.4)
Cholesterol, Total: 172 mg/dL (ref 100–199)
HDL: 61 mg/dL (ref >39)
LDL Chol Calc (NIH): 93 mg/dL (ref 0–99)
Triglycerides: 100 mg/dL (ref 0–149)
VLDL Cholesterol Cal: 18 mg/dL (ref 5–40)

## 2020-12-02 LAB — TSH: TSH: 0.895 u[IU]/mL (ref 0.450–4.500)

## 2020-12-02 NOTE — Telephone Encounter (Signed)
Does she want the referral for the therapy side or does she want the referral to talk to the surgeon for the possible surgical side?

## 2020-12-02 NOTE — Telephone Encounter (Signed)
Patient states she would like to do whatever Dr. Warrick Parisian recommends.

## 2020-12-02 NOTE — Telephone Encounter (Signed)
Pt would like to see someone for the prolapse that was discussed in the office. She does c/o not being able to make it to the bathroom on time. Just since the appt pt thinks that she can feel the prolapse.  Instructed pt that we would put in a referral and that office would be in touch with her. Informed that there are non surgical things to help or surgeries if she wishes.

## 2020-12-03 LAB — CYTOLOGY - PAP
Chlamydia: NEGATIVE
Comment: NEGATIVE
Comment: NEGATIVE
Comment: NORMAL
Diagnosis: NEGATIVE
Neisseria Gonorrhea: NEGATIVE
Trichomonas: NEGATIVE

## 2020-12-03 NOTE — Telephone Encounter (Signed)
Patient aware and verbalizes understanding. 

## 2020-12-03 NOTE — Telephone Encounter (Signed)
Placed referral to rehab, will start there before going surgical

## 2020-12-21 ENCOUNTER — Other Ambulatory Visit: Payer: Self-pay

## 2021-01-06 ENCOUNTER — Other Ambulatory Visit: Payer: Self-pay | Admitting: Family Medicine

## 2021-01-11 ENCOUNTER — Other Ambulatory Visit: Payer: Self-pay | Admitting: Family Medicine

## 2021-01-11 DIAGNOSIS — Z1231 Encounter for screening mammogram for malignant neoplasm of breast: Secondary | ICD-10-CM

## 2021-02-18 ENCOUNTER — Other Ambulatory Visit: Payer: Self-pay | Admitting: *Deleted

## 2021-02-18 MED ORDER — LEVOTHYROXINE SODIUM 137 MCG PO TABS: ORAL_TABLET | ORAL | 2 refills | Status: DC

## 2021-02-28 ENCOUNTER — Other Ambulatory Visit: Payer: Self-pay

## 2021-02-28 ENCOUNTER — Ambulatory Visit (INDEPENDENT_AMBULATORY_CARE_PROVIDER_SITE_OTHER): Payer: Medicare PPO | Admitting: Family Medicine

## 2021-02-28 ENCOUNTER — Encounter: Payer: Self-pay | Admitting: Family Medicine

## 2021-02-28 VITALS — BP 179/102 | HR 84 | Ht 59.0 in | Wt 222.0 lb

## 2021-02-28 DIAGNOSIS — Z79891 Long term (current) use of opiate analgesic: Secondary | ICD-10-CM | POA: Diagnosis not present

## 2021-02-28 DIAGNOSIS — M174 Other bilateral secondary osteoarthritis of knee: Secondary | ICD-10-CM

## 2021-02-28 DIAGNOSIS — I10 Essential (primary) hypertension: Secondary | ICD-10-CM

## 2021-02-28 DIAGNOSIS — R7303 Prediabetes: Secondary | ICD-10-CM

## 2021-02-28 DIAGNOSIS — E039 Hypothyroidism, unspecified: Secondary | ICD-10-CM | POA: Diagnosis not present

## 2021-02-28 MED ORDER — METHYLPREDNISOLONE ACETATE 40 MG/ML IJ SUSP
80.0000 mg | Freq: Once | INTRAMUSCULAR | Status: AC
  Administered 2021-02-28: 80 mg via INTRAMUSCULAR

## 2021-02-28 MED ORDER — TRAMADOL HCL 50 MG PO TABS: 50.0000 mg | ORAL_TABLET | Freq: Four times a day (QID) | ORAL | 1 refills | Status: DC | PRN

## 2021-02-28 MED ORDER — OLMESARTAN MEDOXOMIL 20 MG PO TABS: 20.0000 mg | ORAL_TABLET | Freq: Every day | ORAL | 3 refills | Status: DC

## 2021-02-28 MED ORDER — PRAVASTATIN SODIUM 20 MG PO TABS: 20.0000 mg | ORAL_TABLET | Freq: Every day | ORAL | 3 refills | Status: DC

## 2021-02-28 NOTE — Progress Notes (Signed)
BP (!) 179/102   Pulse 84   Ht 4\' 11"  (1.499 m)   Wt 222 lb (100.7 kg)   LMP 10/06/2013   SpO2 98%   BMI 44.84 kg/m    Subjective:   Patient ID: Rebecca Miles, female    DOB: [birth year], 55 y.o.   MRN: [id number]  HPI: Rebecca Miles is a 55 y.o. female presenting on 02/28/2021 for Medical Management of Chronic Issues   HPI Hypothyroidism recheck Patient is coming in for thyroid recheck today as well. They deny any issues with hair changes or heat or cold problems or diarrhea or constipation. They deny any chest pain or palpitations. They are currently on levothyroxine 121micrograms   Hypertension Patient is currently on hctz and benicar, and their blood pressure today is 140/88. Patient denies any lightheadedness or dizziness. Patient denies headaches, blurred vision, chest pains, shortness of breath, or weakness. Denies any side effects from medication and is content with current medication.   Prediabetes Patient comes in today for recheck of his diabetes. Patient has been currently taking no medication and diet. Patient is currently on an ACE inhibitor/ARB. Patient has not seen an ophthalmologist this year. Patient denies any issues with their feet. The symptom started onset as an adult hypthyroid and htn and prediabetes ARE RELATED TO DM   Bilateral knee osteoarthritis Patient is coming in for bilateral knee osteoarthritis today.  She has had injections previously, she had a lot of pressure on her knees because of rickets and her weight.  She says the injections usually last for pretty long so we will go ahead and do those again.  Pain assessment: Cause of pain- bilateral knee OA due rickets Pain location-bilateral knees Pain on scale of 1-10- 5 Frequency-Daily What increases pain-walking What makes pain Better-injections and rest Effects on ADL -does limit walking any distances more than half a block Any change in general medical condition-none  Current opioids  rx-tramadol 50 mg as needed # meds rx-30 Effectiveness of current meds-works well, rarely uses Adverse reactions from pain meds-none Morphine equivalent- 20  Pill count performed-No Last drug screen - n/a ( high risk q54m, moderate risk q28m, low risk yearly ) Urine drug screen today- Yes Was the [place] reviewed-yes  If yes were their any concerning findings? -None  No flowsheet data found.   Pain contract signed on: Today  Relevant past medical, surgical, family and social history reviewed and updated as indicated. Interim medical history since our last visit reviewed. Allergies and medications reviewed and updated.  Review of Systems  Constitutional:  Negative for chills and fever.  Eyes:  Negative for visual disturbance.  Respiratory:  Negative for chest tightness and shortness of breath.   Cardiovascular:  Negative for chest pain and leg swelling.  Musculoskeletal:  Positive for arthralgias and joint swelling. Negative for back pain and gait problem.  Skin:  Negative for rash.  Neurological:  Negative for light-headedness and headaches.  Psychiatric/Behavioral:  Negative for agitation and behavioral problems.   All other systems reviewed and are negative.  Per HPI unless specifically indicated above   Allergies as of 02/28/2021   No Known Allergies      Medication List        Accurate as of February 28, 2021  9:16 AM. If you have any questions, ask your nurse or doctor.          acetaminophen 650 MG CR tablet Commonly known as: TYLENOL Take 1,300 mg by mouth every 8 (  eight) hours as needed for pain.   fluticasone 50 MCG/ACT nasal spray Commonly known as: FLONASE Place 2 sprays into both nostrils daily. What changed:  when to take this reasons to take this   hydrochlorothiazide 12.5 MG capsule Commonly known as: MICROZIDE Take 12.5 mg by mouth daily.   levothyroxine 137 MCG tablet Commonly known as: SYNTHROID TAKE 1 TABLET BY MOUTH EVERY DAY BEFORE  BREAKFAST   Magnesium 250 MG Tabs Take 250 mg by mouth daily.   multivitamin with minerals Tabs tablet Take 1 tablet by mouth daily.   olmesartan 20 MG tablet Commonly known as: BENICAR Take 20 mg by mouth daily.   phosphorus 155-852-130 MG tablet Commonly known as: K PHOS NEUTRAL Take 1 tablet (250 mg total) by mouth 4 (four) times daily - after meals and at bedtime.   polyethylene glycol powder 17 GM/SCOOP powder Commonly known as: GLYCOLAX/MIRALAX Take 17 g by mouth daily.   pravastatin 20 MG tablet Commonly known as: PRAVACHOL TAKE 1 TABLET BY MOUTH EVERY DAY   traMADol 50 MG tablet Commonly known as: ULTRAM Take 50 mg by mouth every 6 (six) hours as needed for moderate pain.   triamcinolone cream 0.1 % Commonly known as: KENALOG APPLY TO AFFECTED AREA TWICE A DAY         Objective:   BP (!) 179/102   Pulse 84   Ht 4\' 11"  (1.499 m)   Wt 222 lb (100.7 kg)   LMP 10/06/2013   SpO2 98%   BMI 44.84 kg/m   Wt Readings from Last 3 Encounters:  02/28/21 222 lb (100.7 kg)  12/01/20 227 lb (103 kg)  11/26/20 227 lb (103 kg)    Physical Exam Vitals and nursing note reviewed.  Constitutional:      General: She is not in acute distress.    Appearance: She is well-developed. She is not diaphoretic.  Eyes:     Conjunctiva/sclera: Conjunctivae normal.  Cardiovascular:     Rate and Rhythm: Normal rate and regular rhythm.     Heart sounds: Normal heart sounds. No murmur heard. Pulmonary:     Effort: Pulmonary effort is normal. No respiratory distress.     Breath sounds: Normal breath sounds. No wheezing.  Musculoskeletal:        General: Swelling and tenderness (Bilateral knee tenderness both medial and lateral and deformity due to rickets) present. No deformity. Normal range of motion.  Skin:    General: Skin is warm and dry.     Findings: No rash.  Neurological:     Mental Status: She is alert and oriented to person, place, and time.     Coordination:  Coordination normal.  Psychiatric:        Behavior: Behavior normal.    Knee x2 bilateral injection: Consent form signed. Risk factors of bleeding and infection discussed with patient and patient is agreeable towards injection. Patient prepped with Betadine. Lateral approach towards injection used. Injected 80 mg of Depo-Medrol and 1 mL of 2% lidocaine. Patient tolerated procedure well and no side effects from noted. Minimal to no bleeding. Simple bandage applied after.   Assessment & Plan:   Problem List Items Addressed This Visit       Cardiovascular and Mediastinum   Hypertension   Relevant Medications   pravastatin (PRAVACHOL) 20 MG tablet   olmesartan (BENICAR) 20 MG tablet   Other Relevant Orders   CBC with Differential/Platelet     Endocrine   Hypothyroidism - Primary   Relevant  Orders   TSH     Musculoskeletal and Integument   OA (osteoarthritis) of knee   Relevant Medications   traMADol (ULTRAM) 50 MG tablet   Other Relevant Orders   ToxASSURE Select 13 (MW), Urine     Other   Prediabetes  Continue current medication, will check blood work today.  Will give refill for tramadol.  Follow up plan: Return in about 3 months (around 05/31/2021), or if symptoms worsen or fail to improve, for Prediabetes and thyroid and cholesterol.  Counseling provided for all of the vaccine components No orders of the defined types were placed in this encounter.   Caryl Pina, MD [facility] 02/28/2021, 9:16 AM

## 2021-03-01 ENCOUNTER — Ambulatory Visit (INDEPENDENT_AMBULATORY_CARE_PROVIDER_SITE_OTHER): Payer: Medicare PPO

## 2021-03-01 VITALS — Ht 59.0 in | Wt 222.0 lb

## 2021-03-01 DIAGNOSIS — Z0000 Encounter for general adult medical examination without abnormal findings: Secondary | ICD-10-CM

## 2021-03-01 LAB — CBC WITH DIFFERENTIAL/PLATELET
Basophils Absolute: 0 10*3/uL (ref 0.0–0.2)
Basos: 1 %
EOS (ABSOLUTE): 0.1 10*3/uL (ref 0.0–0.4)
Eos: 2 %
Hematocrit: 42.3 % (ref 34.0–46.6)
Hemoglobin: 14.4 g/dL (ref 11.1–15.9)
Immature Grans (Abs): 0 10*3/uL (ref 0.0–0.1)
Immature Granulocytes: 0 %
Lymphocytes Absolute: 2.5 10*3/uL (ref 0.7–3.1)
Lymphs: 32 %
MCH: 30.8 pg (ref 26.6–33.0)
MCHC: 34 g/dL (ref 31.5–35.7)
MCV: 90 fL (ref 79–97)
Monocytes Absolute: 0.6 10*3/uL (ref 0.1–0.9)
Monocytes: 8 %
Neutrophils Absolute: 4.6 10*3/uL (ref 1.4–7.0)
Neutrophils: 57 %
Platelets: 394 10*3/uL (ref 150–450)
RBC: 4.68 x10E6/uL (ref 3.77–5.28)
RDW: 13 % (ref 11.7–15.4)
WBC: 7.9 10*3/uL (ref 3.4–10.8)

## 2021-03-01 LAB — TSH: TSH: 1.77 u[IU]/mL (ref 0.450–4.500)

## 2021-03-01 NOTE — Patient Instructions (Signed)
Rebecca Miles , Thank you for taking time to come for your Medicare Wellness Visit. I appreciate your ongoing commitment to your health goals. Please review the following plan we discussed and let me know if I can assist you in the future.   Screening recommendations/referrals: Colonoscopy: Done 07/15/2019 - Repeat in 10 years Mammogram: Done 11/05/2017 - Repeat annually - Keep appointment 6/29  Bone Density: Due at age 55 Recommended yearly ophthalmology/optometry visit for glaucoma screening and checkup Recommended yearly dental visit for hygiene and checkup  Vaccinations: Influenza vaccine: Done 07/08/2020 - Repeat annually Pneumococcal vaccine: Due (2 vaccines one year apart) Tdap vaccine: Done 04/11/2008 - Past due (every 10 years) Shingles vaccine: Due (2 doses 2-6 months apart) Shingrix discussed. Please contact your pharmacy for coverage information.   Covid-19: Done 11/04/2019 & 11/25/2019 - Due for booster  Advanced directives: Advance directive discussed with you today. I have provided a copy for you to complete at home and have notarized. Once this is complete please bring a copy in to our office so we can scan it into your chart.  Conditions/risks identified: Try to aim for 30 minutes of exercise daily. Water aerobics is a great option. Try to drink about 4 bottles of water every day. Keep a food diary to keep you aware of your choices - Review suggestions at the end of this summary for ways to control your sugar and weight.  Next appointment: Follow up in one year for your annual wellness visit.   Preventive Care 40-64 Years, Female Preventive care refers to lifestyle choices and visits with your health care provider that can promote health and wellness. What does preventive care include? A yearly physical exam. This is also called an annual well check. Dental exams once or twice a year. Routine eye exams. Ask your health care provider how often you should have your eyes  checked. Personal lifestyle choices, including: Daily care of your teeth and gums. Regular physical activity. Eating a healthy diet. Avoiding tobacco and drug use. Limiting alcohol use. Practicing safe sex. Taking low-dose aspirin daily starting at age 45. Taking vitamin and mineral supplements as recommended by your health care provider. What happens during an annual well check? The services and screenings done by your health care provider during your annual well check will depend on your age, overall health, lifestyle risk factors, and family history of disease. Counseling  Your health care provider may ask you questions about your: Alcohol use. Tobacco use. Drug use. Emotional well-being. Home and relationship well-being. Sexual activity. Eating habits. Work and work Statistician. Method of birth control. Menstrual cycle. Pregnancy history. Screening  You may have the following tests or measurements: Height, weight, and BMI. Blood pressure. Lipid and cholesterol levels. These may be checked every 5 years, or more frequently if you are over 57 years old. Skin check. Lung cancer screening. You may have this screening every year starting at age 81 if you have a 30-pack-year history of smoking and currently smoke or have quit within the past 15 years. Fecal occult blood test (FOBT) of the stool. You may have this test every year starting at age 55. Flexible sigmoidoscopy or colonoscopy. You may have a sigmoidoscopy every 5 years or a colonoscopy every 10 years starting at age 43. Hepatitis C blood test. Hepatitis B blood test. Sexually transmitted disease (STD) testing. Diabetes screening. This is done by checking your blood sugar (glucose) after you have not eaten for a while (fasting). You may have this done  every 1-3 years. Mammogram. This may be done every 1-2 years. Talk to your health care provider about when you should start having regular mammograms. This may depend on  whether you have a family history of breast cancer. BRCA-related cancer screening. This may be done if you have a family history of breast, ovarian, tubal, or peritoneal cancers. Pelvic exam and Pap test. This may be done every 3 years starting at age 35. Starting at age 53, this may be done every 5 years if you have a Pap test in combination with an HPV test. Bone density scan. This is done to screen for osteoporosis. You may have this scan if you are at high risk for osteoporosis. Discuss your test results, treatment options, and if necessary, the need for more tests with your health care provider. Vaccines  Your health care provider may recommend certain vaccines, such as: Influenza vaccine. This is recommended every year. Tetanus, diphtheria, and acellular pertussis (Tdap, Td) vaccine. You may need a Td booster every 10 years. Zoster vaccine. You may need this after age 3. Pneumococcal 13-valent conjugate (PCV13) vaccine. You may need this if you have certain conditions and were not previously vaccinated. Pneumococcal polysaccharide (PPSV23) vaccine. You may need one or two doses if you smoke cigarettes or if you have certain conditions. Talk to your health care provider about which screenings and vaccines you need and how often you need them. This information is not intended to replace advice given to you by your health care provider. Make sure you discuss any questions you have with your health care provider. Document Released: 09/24/2015 Document Revised: 05/17/2016 Document Reviewed: 06/29/2015 Elsevier Interactive Patient Education  2017 [place] Prevention in the Home Falls can cause injuries. They can happen to people of all ages. There are many things you can do to make your home safe and to help prevent falls. What can I do on the outside of my home? Regularly fix the edges of walkways and driveways and fix any cracks. Remove anything that might make you trip as you  walk through a door, such as a raised step or threshold. Trim any bushes or trees on the path to your home. Use bright outdoor lighting. Clear any walking paths of anything that might make someone trip, such as rocks or tools. Regularly check to see if handrails are loose or broken. Make sure that both sides of any steps have handrails. Any raised decks and porches should have guardrails on the edges. Have any leaves, snow, or ice cleared regularly. Use sand or salt on walking paths during winter. Clean up any spills in your garage right away. This includes oil or grease spills. What can I do in the bathroom? Use night lights. Install grab bars by the toilet and in the tub and shower. Do not use towel bars as grab bars. Use non-skid mats or decals in the tub or shower. If you need to sit down in the shower, use a plastic, non-slip stool. Keep the floor dry. Clean up any water that spills on the floor as soon as it happens. Remove soap buildup in the tub or shower regularly. Attach bath mats securely with double-sided non-slip rug tape. Do not have throw rugs and other things on the floor that can make you trip. What can I do in the bedroom? Use night lights. Make sure that you have a light by your bed that is easy to reach. Do not use any sheets  or blankets that are too big for your bed. They should not hang down onto the floor. Have a firm chair that has side arms. You can use this for support while you get dressed. Do not have throw rugs and other things on the floor that can make you trip. What can I do in the kitchen? Clean up any spills right away. Avoid walking on wet floors. Keep items that you use a lot in easy-to-reach places. If you need to reach something above you, use a strong step stool that has a grab bar. Keep electrical cords out of the way. Do not use floor polish or wax that makes floors slippery. If you must use wax, use non-skid floor wax. Do not have throw rugs  and other things on the floor that can make you trip. What can I do with my stairs? Do not leave any items on the stairs. Make sure that there are handrails on both sides of the stairs and use them. Fix handrails that are broken or loose. Make sure that handrails are as long as the stairways. Check any carpeting to make sure that it is firmly attached to the stairs. Fix any carpet that is loose or worn. Avoid having throw rugs at the top or bottom of the stairs. If you do have throw rugs, attach them to the floor with carpet tape. Make sure that you have a light switch at the top of the stairs and the bottom of the stairs. If you do not have them, ask someone to add them for you. What else can I do to help prevent falls? Wear shoes that: Do not have high heels. Have rubber bottoms. Are comfortable and fit you well. Are closed at the toe. Do not wear sandals. If you use a stepladder: Make sure that it is fully opened. Do not climb a closed stepladder. Make sure that both sides of the stepladder are locked into place. Ask someone to hold it for you, if possible. Clearly mark and make sure that you can see: Any grab bars or handrails. First and last steps. Where the edge of each step is. Use tools that help you move around (mobility aids) if they are needed. These include: Canes. Walkers. Scooters. Crutches. Turn on the lights when you go into a dark area. Replace any light bulbs as soon as they burn out. Set up your furniture so you have a clear path. Avoid moving your furniture around. If any of your floors are uneven, fix them. If there are any pets around you, be aware of where they are. Review your medicines with your doctor. Some medicines can make you feel dizzy. This can increase your chance of falling. Ask your doctor what other things that you can do to help prevent falls. This information is not intended to replace advice given to you by your health care provider. Make sure  you discuss any questions you have with your health care provider. Document Released: 06/24/2009 Document Revised: 02/03/2016 Document Reviewed: 10/02/2014 Elsevier Interactive Patient Education  2017 [employer] Inc[employer]  Prediabetes Eating Plan Prediabetes is a condition that causes blood sugar (glucose) levels to be higher than normal. This increases the risk for developing type 2 diabetes (type 2 diabetes mellitus). Working with a health care provider or nutrition specialist (dietitian) to make diet and lifestyle changes can help prevent the onset of diabetes. These changes may help you: Control your blood glucose levels. Improve your cholesterol levels. Manage your blood pressure. What  are tips for following this plan? Reading food labels Read food labels to check the amount of fat, salt (sodium), and sugar in prepackaged foods. Avoid foods that have: Saturated fats. Trans fats. Added sugars. Avoid foods that have more than 300 milligrams (mg) of sodium per serving. Limit your sodium intake to less than 2,300 mg each day. Shopping Avoid buying pre-made and processed foods. Avoid buying drinks with added sugar. Cooking Cook with olive oil. Do not use butter, lard, or ghee. Bake, broil, grill, steam, or boil foods. Avoid frying. Meal planning  Work with your dietitian to create an eating plan that is right for you. This may include tracking how many calories you take in each day. Use a food diary, notebook, or mobile application to track what you eat at each meal. Consider following a Mediterranean diet. This includes: Eating several servings of fresh fruits and vegetables each day. Eating fish at least twice a week. Eating one serving each day of whole grains, beans, nuts, and seeds. Using olive oil instead of other fats. Limiting alcohol. Limiting red meat. Using nonfat or low-fat dairy products. Consider following a plant-based diet. This includes dietary choices that focus on eating  mostly vegetables and fruit, grains, beans, nuts, and seeds. If you have high blood pressure, you may need to limit your sodium intake or follow a diet such as the DASH (Dietary Approaches to Stop Hypertension) eating plan. The DASH diet aims to lower high blood pressure.  Lifestyle Set weight loss goals with help from your health care team. It is recommended that most people with prediabetes lose 7% of their body weight. Exercise for at least 30 minutes 5 or more days a week. Attend a support group or seek support from a mental health counselor. Take over-the-counter and prescription medicines only as told by your health care provider. What foods are recommended? Fruits Berries. Bananas. Apples. Oranges. Grapes. Papaya. Mango. Pomegranate. Kiwi.Grapefruit. Cherries. Vegetables Lettuce. Spinach. Peas. Beets. Cauliflower. Cabbage. Broccoli. Carrots.Tomatoes. Squash. Eggplant. Herbs. Peppers. Onions. Cucumbers. Brussels sprouts. Grains Whole grains, such as whole-wheat or whole-grain breads, crackers, cereals, and pasta. Unsweetened oatmeal. Bulgur. Barley. Quinoa. Brown rice. Corn orwhole-wheat flour tortillas or taco shells. Meats and other proteins Seafood. Poultry without skin. Lean cuts of pork and beef. Tofu. Eggs. Nuts.Beans. Dairy Low-fat or fat-free dairy products, such as yogurt, cottage cheese, and cheese. Beverages Water. Tea. Coffee. Sugar-free or diet soda. Seltzer water. Low-fat or nonfatmilk. Milk alternatives, such as soy or almond milk. Fats and oils Olive oil. Canola oil. Sunflower oil. Grapeseed oil. Avocado. Walnuts. Sweets and desserts Sugar-free or low-fat pudding. Sugar-free or low-fat ice cream and other frozentreats. Seasonings and condiments Herbs. Sodium-free spices. Mustard. Relish. Low-salt, low-sugar ketchup.Low-salt, low-sugar barbecue sauce. Low-fat or fat-free mayonnaise. The items listed above may not be a complete list of recommended foods and beverages.  Contact a dietitian for more information. What foods are not recommended? Fruits Fruits canned with syrup. Vegetables Canned vegetables. Frozen vegetables with butter or cream sauce. Grains Refined white flour and flour products, such as bread, pasta, snack foods, andcereals. Meats and other proteins Fatty cuts of meat. Poultry with skin. Breaded or fried meat. Processed meats. Dairy Full-fat yogurt, cheese, or milk. Beverages Sweetened drinks, such as iced tea and soda. Fats and oils Butter. Lard. Ghee. Sweets and desserts Baked goods, such as cake, cupcakes, pastries, cookies, and cheesecake. Seasonings and condiments Spice mixes with added salt. Ketchup. Barbecue sauce. Mayonnaise. The items listed above may not be a  complete list of foods and beverages that are not recommended. Contact a dietitian for more information. Where to find more information American Diabetes Association: www.diabetes.org Summary You may need to make diet and lifestyle changes to help prevent the onset of diabetes. These changes can help you control blood sugar, improve cholesterol levels, and manage blood pressure. Set weight loss goals with help from your health care team. It is recommended that most people with prediabetes lose 7% of their body weight. Consider following a Mediterranean diet. This includes eating plenty of fresh fruits and vegetables, whole grains, beans, nuts, seeds, fish, and low-fat dairy, and using olive oil instead of other fats. This information is not intended to replace advice given to you by your health care provider. Make sure you discuss any questions you have with your healthcare provider. Document Revised: 11/27/2019 Document Reviewed: 11/27/2019 Elsevier Patient Education  [place].

## 2021-03-01 NOTE — Progress Notes (Signed)
Subjective:   Rebecca Miles is a 55 y.o. female who presents for Medicare Annual (Subsequent) preventive examination.  Virtual Visit via Telephone Note  I connected with  Rebecca Miles on 03/01/21 at  1:15 PM EDT by telephone and verified that I am speaking with the correct person using two identifiers.  Location: Patient: Home Provider: [facility] Persons participating in the virtual visit: patient/Nurse Health Advisor   I discussed the limitations, risks, security and privacy concerns of performing an evaluation and management service by telephone and the availability of in person appointments. The patient expressed understanding and agreed to proceed.  Interactive audio and video telecommunications were attempted between this nurse and patient, however failed, due to patient having technical difficulties OR patient did not have access to video capability.  We continued and completed visit with audio only.  Some vital signs may be absent or patient reported.   Derl Abalos E Colleen Kotlarz, LPN   Review of Systems     Cardiac Risk Factors include: advanced age (>33men, >88 women);sedentary lifestyle;obesity (BMI >30kg/m2);dyslipidemia;hypertension     Objective:    Today's Vitals   03/01/21 1322  Weight: 222 lb (100.7 kg)  Height: 4\' 11"  (1.499 m)   Body mass index is 44.84 kg/m.  Advanced Directives 03/01/2021 07/15/2019 05/29/2018 10/25/2017 09/26/2012 09/17/2012  Does Patient Have a Medical Advance Directive? No No No No Patient does not have advance directive;Patient would not like information Patient does not have advance directive  Would patient like information on creating a medical advance directive? Yes (MAU/Ambulatory/Procedural Areas - Information given) No - Patient declined - No - Patient declined - -  Pre-existing out of facility DNR order (yellow form or pink MOST form) - - - - No -    Current Medications (verified) Outpatient Encounter Medications as of 03/01/2021   Medication Sig   acetaminophen (TYLENOL) 650 MG CR tablet Take 1,300 mg by mouth every 8 (eight) hours as needed for pain.   fluticasone (FLONASE) 50 MCG/ACT nasal spray Place 2 sprays into both nostrils daily. (Patient taking differently: Place 2 sprays into both nostrils as needed for allergies.)   hydrochlorothiazide (MICROZIDE) 12.5 MG capsule Take 12.5 mg by mouth daily.   levothyroxine (SYNTHROID) 137 MCG tablet TAKE 1 TABLET BY MOUTH EVERY DAY BEFORE BREAKFAST   Magnesium 250 MG TABS Take 250 mg by mouth daily.   Multiple Vitamin (MULTIVITAMIN WITH MINERALS) TABS tablet Take 1 tablet by mouth daily.   olmesartan (BENICAR) 20 MG tablet Take 1 tablet (20 mg total) by mouth daily.   phosphorus (K PHOS NEUTRAL) 155-852-130 MG tablet Take 1 tablet (250 mg total) by mouth 4 (four) times daily - after meals and at bedtime.   polyethylene glycol powder (GLYCOLAX/MIRALAX) 17 GM/SCOOP powder Take 17 g by mouth daily.   pravastatin (PRAVACHOL) 20 MG tablet Take 1 tablet (20 mg total) by mouth daily.   traMADol (ULTRAM) 50 MG tablet Take 1 tablet (50 mg total) by mouth every 6 (six) hours as needed for moderate pain.   triamcinolone (KENALOG) 0.1 % APPLY TO AFFECTED AREA TWICE A DAY   [DISCONTINUED] valsartan (DIOVAN) 160 MG tablet  (Patient not taking: Reported on 03/01/2021)   No facility-administered encounter medications on file as of 03/01/2021.    Allergies (verified) Patient has no known allergies.   History: Past Medical History:  Diagnosis Date   Allergy    Arthritis    Hypertension    Hypothyroidism    Osteoarthritis    ankles  and joints   Rickets    Past Surgical History:  Procedure Laterality Date   CESAREAN SECTION     COLONOSCOPY N/A 07/15/2019   Diverticulosis in cecum, three 4-6 mm polyps s/p removal. Tubular adenomas, sessile serrated polyps without dysplasia. 3 year surveillance.    FEMUR IM NAIL  09/26/2012   Procedure: INTRAMEDULLARY (IM) RETROGRADE FEMORAL NAILING;   Surgeon: Gearlean Alf, MD;  Location: [facility];  Service: Orthopedics;  Laterality: Right;   LEG SURGERY     bilateral at age 43    POLYPECTOMY  07/15/2019   Procedure: POLYPECTOMY;  Surgeon: Daneil Dolin, MD;  Location: [facility];  Service: Endoscopy;;  cecal    TIBIA OSTEOTOMY  09/26/2012   Procedure: TIBIAL OSTEOTOMY;  Surgeon: Gearlean Alf, MD;  Location: [facility];  Service: Orthopedics;  Laterality: Right;  RIGHT FEMORAL CORRECTIVE OSTEOMITY WITH RETROGRADE IM FEMORAL NAIL   TUBAL LIGATION     TUBAL LIGATION     Family History  Problem Relation Age of Onset   Diabetes Mother    Hypertension Mother    Kidney disease Mother    Parkinson's disease Father    Lupus Sister    Drug abuse Brother    Early death Brother    Stroke Brother    Arthritis Maternal Grandmother    Hypertension Maternal Grandmother    Kidney disease Maternal Grandmother    Graves' disease Son    Myasthenia gravis Son    Rickets Son    Rickets Son    Rickets Son    ADD / ADHD Son    Colon cancer Neg Hx    Colon polyps Neg Hx    Social History   Socioeconomic History   Marital status: Married    Spouse name: Alvester Chou   Number of children: 4   Years of education: 41   Highest education level: Some college, no degree  Occupational History   Occupation: disabled  Tobacco Use   Smoking status: Former    Packs/day: 0.50    Years: 20.00    Pack years: 10.00    Types: Cigarettes    Quit date: 09/12/1999    Years since quitting: 21.4   Smokeless tobacco: Never   Tobacco comments:    No current tobacco use  Vaping Use   Vaping Use: Never used  Substance and Sexual Activity   Alcohol use: Yes    Comment: occasional   Drug use: No   Sexual activity: Not Currently  Other Topics Concern   Not on file  Social History Narrative   Married; All 4 of her children and 2 grandchildren live with her.   Social Determinants of Health   Financial Resource Strain: Low Risk    Difficulty of Paying  Living Expenses: Not hard at all  Food Insecurity: No Food Insecurity   Worried About Charity fundraiser in the Last Year: Never true   [place] in the Last Year: Never true  Transportation Needs: No Transportation Needs   Lack of Transportation (Medical): No   Lack of Transportation (Non-Medical): No  Physical Activity: Insufficiently Active   Days of Exercise per Week: 7 days   Minutes of Exercise per Session: 10 min  Stress: No Stress Concern Present   Feeling of Stress : Not at all  Social Connections: Socially Integrated   Frequency of Communication with Friends and Family: More than three times a week   Frequency of Social Gatherings with Friends  and Family: More than three times a week   Attends Religious Services: More than 4 times per year   Active Member of Clubs or Organizations: Yes   Attends Music therapist: More than 4 times per year   Marital Status: Married    Tobacco Counseling Counseling given: Not Answered Tobacco comments: No current tobacco use   Clinical Intake:  Pre-visit preparation completed: Yes  Pain : No/denies pain     BMI - recorded: 44.84 Nutritional Status: BMI > 30  Obese Nutritional Risks: None Diabetes: No  How often do you need to have someone help you when you read instructions, pamphlets, or other written materials from your doctor or pharmacy?: 1 - Never  Diabetic? No  Interpreter Needed?: No  Information entered by :: Fouad Taul, LPN   Activities of Daily Living In your present state of health, do you have any difficulty performing the following activities: 03/01/2021  Hearing? N  Vision? N  Difficulty concentrating or making decisions? Y  Walking or climbing stairs? Y  Dressing or bathing? N  Doing errands, shopping? N  Preparing Food and eating ? N  Using the Toilet? N  In the past six months, have you accidently leaked urine? N  Do you have problems with loss of bowel control? N  Managing your  Medications? N  Managing your Finances? N  Housekeeping or managing your Housekeeping? N  Some recent data might be hidden    Patient Care Team: Dettinger, Fransisca Kaufmann, MD as PCP - General (Family Medicine) Gala Romney, Cristopher Estimable, MD as Consulting Physician (Gastroenterology)  Indicate any recent Medical Services you may have received from other than [facility] providers in the past year (date may be approximate).     Assessment:   This is a routine wellness examination for Rebecca Miles.  Hearing/Vision screen Hearing Screening - Comments:: Denies hearing difficulties  Vision Screening - Comments:: Wears eyeglasses - behind on eye exams with [facility] in [place], [place]  Dietary issues and exercise activities discussed: Current Exercise Habits: Home exercise routine, Type of exercise: walking, Time (Minutes): 10, Frequency (Times/Week): 7, Weekly Exercise (Minutes/Week): 70, Intensity: Mild, Exercise limited by: orthopedic condition(s)   Goals Addressed             This Visit's Progress    Patient Stated       She would like to lose weight, is starting water aerobics at [facility], wants to control sugar before she gets diabetes (recently dx as pre-diabetic)        Depression Screen PHQ 2/9 Scores 03/01/2021 12/01/2020 11/26/2020 07/08/2020 11/14/2018 07/24/2018 06/19/2018  PHQ - 2 Score 0 0 0 0 1 0 0  PHQ- 9 Score - - - - - - -    Fall Risk Fall Risk  03/01/2021 02/28/2021 12/01/2020 11/26/2020 07/08/2020  Falls in the past year? 1 1 1 1 1   Number falls in past yr: 1 0 1 1 1   Injury with Fall? 1 0 0 0 0  Comment - - - - -  Risk for fall due to : Impaired mobility;Impaired vision;Orthopedic patient;History of fall(s) Impaired mobility;Orthopedic patient Impaired balance/gait;Orthopedic patient Impaired balance/gait;History of fall(s) Impaired balance/gait  Follow up Education provided;Falls prevention discussed Falls evaluation completed Falls evaluation completed Falls evaluation completed Falls evaluation  completed    FALL RISK PREVENTION PERTAINING TO THE HOME:  Any stairs in or around the home? Yes  If so, are there any without handrails? No  Home free of loose throw rugs in  walkways, pet beds, electrical cords, etc? Yes  Adequate lighting in your home to reduce risk of falls? Yes   ASSISTIVE DEVICES UTILIZED TO PREVENT FALLS:  Life alert? No  Use of a cane, walker or w/c? Yes  Grab bars in the bathroom? Yes  Shower chair or bench in shower? Yes  Elevated toilet seat or a handicapped toilet? Yes   TIMED UP AND GO:  Was the test performed? No . Telephonic visit.  Cognitive Function: Normal cognitive status assessed by direct observation by this Nurse Health Advisor. No abnormalities found.   MMSE - Mini Mental State Exam 10/25/2017  Orientation to time 5  Orientation to Place 5  Registration 3  Attention/ Calculation 5  Recall 3  Language- name 2 objects 2  Language- repeat 1  Language- follow 3 step command 3  Language- read & follow direction 1  Write a sentence 1  Copy design 1  Total score 30     6CIT Screen 03/01/2021  What Year? 0 points  What month? 0 points  What time? 0 points  Count back from 20 0 points  Months in reverse 2 points  Repeat phrase 4 points  Total Score 6    Immunizations Immunization History  Administered Date(s) Administered   DTaP 05/07/1969, 06/18/1969, 09/17/1969, 06/17/1970, 06/16/1971   Hepatitis B 03/14/1999, 04/15/1999, 04/29/2001   IPV 06/17/1970, 06/16/1971, 06/26/1974   Influenza, Quadrivalent, Recombinant, Inj, Pf 06/19/2019   Influenza,inj,Quad PF,6+ Mos 06/22/2017, 06/19/2018, 07/08/2020   Influenza-Unspecified 06/19/2019   Moderna Sars-Covid-2 Vaccination 11/04/2019, 11/25/2019   Rubella 11/05/1969   Td 04/30/1996   Tdap 04/11/2008    TDAP status: Due, Education has been provided regarding the importance of this vaccine. Advised may receive this vaccine at local pharmacy or Health Dept. Aware to provide a copy of  the vaccination record if obtained from local pharmacy or Health Dept. Verbalized acceptance and understanding.  Flu Vaccine status: Up to date  Pneumococcal vaccine status: Due, Education has been provided regarding the importance of this vaccine. Advised may receive this vaccine at local pharmacy or Health Dept. Aware to provide a copy of the vaccination record if obtained from local pharmacy or Health Dept. Verbalized acceptance and understanding.  Covid-19 vaccine status: Information provided on how to obtain vaccines.   Qualifies for Shingles Vaccine? Yes   Zostavax completed No   Shingrix Completed?: No.    Education has been provided regarding the importance of this vaccine. Patient has been advised to call insurance company to determine out of pocket expense if they have not yet received this vaccine. Advised may also receive vaccine at local pharmacy or Health Dept. Verbalized acceptance and understanding.  Screening Tests Health Maintenance  Topic Date Due   Zoster Vaccines- Shingrix (1 of 2) Never done   TETANUS/TDAP  04/25/2021 (Originally 04/11/2018)   MAMMOGRAM  05/30/2021 (Originally 11/06/2019)   COVID-19 Vaccine (3 - Booster) 11/25/2021 (Originally 04/26/2020)   INFLUENZA VACCINE  04/11/2021   PAP SMEAR-Modifier  12/02/2023   COLONOSCOPY (Pts 45-17yrs Insurance coverage will need to be confirmed)  07/14/2029   Pneumococcal Vaccine 65-17 Years old  Aged Out   HPV VACCINES  Aged Out   Hepatitis C Screening  Discontinued   HIV Screening  Discontinued    Health Maintenance  Health Maintenance Due  Topic Date Due   Zoster Vaccines- Shingrix (1 of 2) Never done    Colorectal cancer screening: Type of screening: Colonoscopy. Completed 07/15/2019. Repeat every 10 years  Mammogram status: Ordered  02/2021. Pt provided with contact info and advised to call to schedule appt.  Has appt 03/09/21  Bone Density Status: not due until age 55  Lung Cancer Screening: (Low Dose CT Chest  recommended if Age 25-80 years, 30 pack-year currently smoking OR have quit w/in 15years.) does not qualify.   Additional Screening:  Hepatitis C Screening: does not qualify  Vision Screening: Recommended annual ophthalmology exams for early detection of glaucoma and other disorders of the eye. Is the patient up to date with their annual eye exam?  No  Who is the provider or what is the name of the office in which the patient attends annual eye exams? [facility] If pt is not established with a provider, would they like to be referred to a provider to establish care? No .   Dental Screening: Recommended annual dental exams for proper oral hygiene  Community Resource Referral / Chronic Care Management: CRR required this visit?  No   CCM required this visit?  No      Plan:     I have personally reviewed and noted the following in the patient's chart:   Medical and social history Use of alcohol, tobacco or illicit drugs  Current medications and supplements including opioid prescriptions.  Functional ability and status Nutritional status Physical activity Advanced directives List of other physicians Hospitalizations, surgeries, and ER visits in previous 12 months Vitals Screenings to include cognitive, depression, and falls Referrals and appointments  In addition, I have reviewed and discussed with patient certain preventive protocols, quality metrics, and best practice recommendations. A written personalized care plan for preventive services as well as general preventive health recommendations were provided to patient.     Sandrea Hammond, LPN   [id number]/54/[id number]   Nurse Notes: None

## 2021-03-07 ENCOUNTER — Ambulatory Visit: Payer: Medicare HMO | Admitting: Family Medicine

## 2021-03-08 LAB — TOXASSURE SELECT 13 (MW), URINE

## 2021-06-03 ENCOUNTER — Ambulatory Visit: Payer: Medicare PPO | Admitting: Family Medicine

## 2021-06-20 ENCOUNTER — Ambulatory Visit (INDEPENDENT_AMBULATORY_CARE_PROVIDER_SITE_OTHER): Payer: Medicare PPO | Admitting: Family Medicine

## 2021-06-20 ENCOUNTER — Other Ambulatory Visit: Payer: Self-pay

## 2021-06-20 ENCOUNTER — Encounter: Payer: Self-pay | Admitting: Family Medicine

## 2021-06-20 VITALS — BP 141/86 | HR 94 | Ht 59.0 in | Wt 223.0 lb

## 2021-06-20 DIAGNOSIS — R7303 Prediabetes: Secondary | ICD-10-CM | POA: Diagnosis not present

## 2021-06-20 DIAGNOSIS — E039 Hypothyroidism, unspecified: Secondary | ICD-10-CM | POA: Diagnosis not present

## 2021-06-20 DIAGNOSIS — I10 Essential (primary) hypertension: Secondary | ICD-10-CM

## 2021-06-20 DIAGNOSIS — Z23 Encounter for immunization: Secondary | ICD-10-CM

## 2021-06-20 LAB — BAYER DCA HB A1C WAIVED: HB A1C (BAYER DCA - WAIVED): 5.2 % (ref 4.8–5.6)

## 2021-06-20 MED ORDER — OZEMPIC (0.25 OR 0.5 MG/DOSE) 2 MG/1.5ML ~~LOC~~ SOPN: 0.5000 mg | PEN_INJECTOR | SUBCUTANEOUS | 2 refills | Status: DC

## 2021-06-20 NOTE — Progress Notes (Signed)
BP (!) 141/86   Pulse 94   Ht _0  (1.499 m)   Wt 223 lb (101.2 kg)   LMP 10/06/2013   SpO2 97%   BMI 45.04 kg/m    Subjective:   Patient ID: Rebecca Miles, female    DOB: 1965/11/09, 55 y.o.   MRN: [id number]  HPI: Rebecca Miles is a 55 y.o. female presenting on 06/20/2021 for Medical Management of Chronic Issues and Hypothyroidism   HPI Hypothyroidism recheck Patient is coming in for thyroid recheck today as well. They deny any issues with hair changes or heat or cold problems or diarrhea or constipation. They deny any chest pain or palpitations. They are currently on levothyroxine 137 micrograms   Hypertension Patient is currently on olmesartan, and their blood pressure today is 153/93 and then 141/86 on repeat. Patient denies any lightheadedness or dizziness. Patient denies headaches, blurred vision, chest pains, shortness of breath, or weakness. Denies any side effects from medication and is content with current medication.   Prediabetes  patient comes in today for recheck of his diabetes. Patient has been currently taking no medication currently has been doing diet controlled, will check A1c today. Patient is currently on an ACE inhibitor/ARB. Patient has seen an ophthalmologist this year. Patient denies any issues with their feet. The symptom started onset as an adult hypertension and hypothyroidism ARE RELATED TO DM   Patient fights sleep problems from the records and causes issues and arthritis in her joints.  She eventually wants replacement has been doing injections but wants to continue with Advil for today and do an injection in the future.  Relevant past medical, surgical, family and social history reviewed and updated as indicated. Interim medical history since our last visit reviewed. Allergies and medications reviewed and updated.  Review of Systems  Constitutional:  Negative for chills and fever.  Eyes:  Negative for visual disturbance.  Respiratory:   Negative for chest tightness and shortness of breath.   Cardiovascular:  Negative for chest pain and leg swelling.  Musculoskeletal:  Positive for arthralgias and gait problem. Negative for back pain.  Skin:  Negative for rash.  Neurological:  Negative for light-headedness and headaches.  Psychiatric/Behavioral:  Negative for agitation and behavioral problems.   All other systems reviewed and are negative.  Per HPI unless specifically indicated above   Allergies as of 06/20/2021   No Known Allergies      Medication List        Accurate as of June 20, 2021 11:03 AM. If you have any questions, ask your nurse or doctor.          acetaminophen 650 MG CR tablet Commonly known as: TYLENOL Take 1,300 mg by mouth every 8 (eight) hours as needed for pain.   fluticasone 50 MCG/ACT nasal spray Commonly known as: FLONASE Place 2 sprays into both nostrils daily. What changed:  when to take this reasons to take this   hydrochlorothiazide 12.5 MG capsule Commonly known as: MICROZIDE Take 12.5 mg by mouth daily.   levothyroxine 137 MCG tablet Commonly known as: SYNTHROID TAKE 1 TABLET BY MOUTH EVERY DAY BEFORE BREAKFAST   Magnesium 250 MG Tabs Take 250 mg by mouth daily.   multivitamin with minerals Tabs tablet Take 1 tablet by mouth daily.   olmesartan 20 MG tablet Commonly known as: BENICAR Take 1 tablet (20 mg total) by mouth daily.   phosphorus 155-852-130 MG tablet Commonly known as: K PHOS NEUTRAL Take 1 tablet (250  mg total) by mouth 4 (four) times daily - after meals and at bedtime.   polyethylene glycol powder 17 GM/SCOOP powder Commonly known as: GLYCOLAX/MIRALAX Take 17 g by mouth daily.   pravastatin 20 MG tablet Commonly known as: PRAVACHOL Take 1 tablet (20 mg total) by mouth daily.   traMADol 50 MG tablet Commonly known as: ULTRAM Take 1 tablet (50 mg total) by mouth every 6 (six) hours as needed for moderate pain.   triamcinolone cream 0.1  % Commonly known as: KENALOG APPLY TO AFFECTED AREA TWICE A DAY         Objective:   BP (!) 141/86   Pulse 94   Ht _0  (1.499 m)   Wt 223 lb (101.2 kg)   LMP 10/06/2013   SpO2 97%   BMI 45.04 kg/m   Wt Readings from Last 3 Encounters:  06/20/21 223 lb (101.2 kg)  03/01/21 222 lb (100.7 kg)  02/28/21 222 lb (100.7 kg)    Physical Exam Vitals and nursing note reviewed.  Constitutional:      General: She is not in acute distress.    Appearance: She is well-developed. She is not diaphoretic.  Eyes:     Conjunctiva/sclera: Conjunctivae normal.  Cardiovascular:     Rate and Rhythm: Normal rate and regular rhythm.     Heart sounds: Normal heart sounds. No murmur heard. Pulmonary:     Effort: Pulmonary effort is normal. No respiratory distress.     Breath sounds: Normal breath sounds. No wheezing.  Musculoskeletal:        General: No swelling.  Skin:    General: Skin is warm and dry.     Findings: No rash.  Neurological:     Mental Status: She is alert and oriented to person, place, and time.     Coordination: Coordination normal.  Psychiatric:        Behavior: Behavior normal.      Assessment & Plan:   Problem List Items Addressed This Visit       Cardiovascular and Mediastinum   Hypertension - Primary   Relevant Orders   Lipid panel     Endocrine   Hypothyroidism   Relevant Orders   Lipid panel   TSH     Other   Prediabetes   Relevant Orders   CMP14+EGFR   Lipid panel   Bayer DCA Hb A1c Waived    BP up slightly, will monitor closely at home.  No change in medication today.  Focus on diet and lifestyle modification.  She is going to start focusing on weight loss. Follow up plan: Return in about 3 months (around 09/20/2021), or if symptoms worsen or fail to improve, for Thyroid and prediabetes recheck.  Counseling provided for all of the vaccine components Orders Placed This Encounter  Procedures   CMP14+EGFR   Lipid panel   TSH   Bayer  [place] Hb A1c [place], MD [facility] 06/20/2021, 11:03 AM

## 2021-06-20 NOTE — Addendum Note (Signed)
Addended by: Alphonzo Dublin on: 06/20/2021 11:11 AM   Modules accepted: Orders

## 2021-06-21 LAB — LIPID PANEL
Chol/HDL Ratio: 3.8 ratio (ref 0.0–4.4)
Cholesterol, Total: 179 mg/dL (ref 100–199)
HDL: 47 mg/dL (ref >39)
LDL Chol Calc (NIH): 106 mg/dL — ABNORMAL HIGH (ref 0–99)
Triglycerides: 147 mg/dL (ref 0–149)
VLDL Cholesterol Cal: 26 mg/dL (ref 5–40)

## 2021-06-21 LAB — CMP14+EGFR
ALT: 16 IU/L (ref 0–32)
AST: 19 IU/L (ref 0–40)
Albumin/Globulin Ratio: 1.7 (ref 1.2–2.2)
Albumin: 4.1 g/dL (ref 3.8–4.9)
Alkaline Phosphatase: 134 IU/L — ABNORMAL HIGH (ref 44–121)
BUN/Creatinine Ratio: 9 (ref 9–23)
BUN: 5 mg/dL — ABNORMAL LOW (ref 6–24)
Bilirubin Total: 0.5 mg/dL (ref 0.0–1.2)
CO2: 23 mmol/L (ref 20–29)
Calcium: 9.6 mg/dL (ref 8.7–10.2)
Chloride: 105 mmol/L (ref 96–106)
Creatinine, Ser: 0.58 mg/dL (ref 0.57–1.00)
Globulin, Total: 2.4 g/dL (ref 1.5–4.5)
Glucose: 88 mg/dL (ref 70–99)
Potassium: 4 mmol/L (ref 3.5–5.2)
Sodium: 141 mmol/L (ref 134–144)
Total Protein: 6.5 g/dL (ref 6.0–8.5)
eGFR: 108 mL/min/{1.73_m2} (ref >59)

## 2021-06-21 LAB — TSH: TSH: 0.333 u[IU]/mL — ABNORMAL LOW (ref 0.450–4.500)

## 2021-06-23 ENCOUNTER — Telehealth: Payer: Self-pay | Admitting: Family Medicine

## 2021-06-23 NOTE — Telephone Encounter (Signed)
Pt informed and understood. She will get coupon from manufacturer website.

## 2021-06-23 NOTE — Telephone Encounter (Signed)
Left message to call back.  Prescription was sent to pharmacy but insurance will not pay for it.  Patient can go online and search for ozempic coupon and find one that she will pay $25 for medication.

## 2021-07-01 ENCOUNTER — Other Ambulatory Visit: Payer: Self-pay

## 2021-07-01 MED ORDER — LEVOTHYROXINE SODIUM 125 MCG PO TABS: 125.0000 ug | ORAL_TABLET | Freq: Every day | ORAL | 1 refills | Status: DC

## 2021-09-01 ENCOUNTER — Other Ambulatory Visit: Payer: Self-pay | Admitting: Family Medicine

## 2021-09-02 NOTE — Telephone Encounter (Signed)
Last office visit 06/20/21 Last refill 10/13/20, #30 grams, 1 refill

## 2021-09-21 ENCOUNTER — Ambulatory Visit (INDEPENDENT_AMBULATORY_CARE_PROVIDER_SITE_OTHER): Payer: Medicare HMO | Admitting: Family Medicine

## 2021-09-21 ENCOUNTER — Encounter: Payer: Self-pay | Admitting: Family Medicine

## 2021-09-21 VITALS — BP 131/85 | HR 86 | Ht 59.0 in | Wt 223.0 lb

## 2021-09-21 DIAGNOSIS — R7303 Prediabetes: Secondary | ICD-10-CM | POA: Diagnosis not present

## 2021-09-21 DIAGNOSIS — E039 Hypothyroidism, unspecified: Secondary | ICD-10-CM

## 2021-09-21 DIAGNOSIS — M25561 Pain in right knee: Secondary | ICD-10-CM | POA: Diagnosis not present

## 2021-09-21 DIAGNOSIS — G8929 Other chronic pain: Secondary | ICD-10-CM

## 2021-09-21 DIAGNOSIS — M25562 Pain in left knee: Secondary | ICD-10-CM | POA: Diagnosis not present

## 2021-09-21 DIAGNOSIS — I10 Essential (primary) hypertension: Secondary | ICD-10-CM | POA: Diagnosis not present

## 2021-09-21 LAB — BAYER DCA HB A1C WAIVED: HB A1C (BAYER DCA - WAIVED): 5.5 % (ref 4.8–5.6)

## 2021-09-21 MED ORDER — OZEMPIC (0.25 OR 0.5 MG/DOSE) 2 MG/1.5ML ~~LOC~~ SOPN: 0.5000 mg | PEN_INJECTOR | SUBCUTANEOUS | 2 refills | Status: DC

## 2021-09-21 MED ORDER — LEVOTHYROXINE SODIUM 125 MCG PO TABS: 125.0000 ug | ORAL_TABLET | Freq: Every day | ORAL | 3 refills | Status: DC

## 2021-09-21 MED ORDER — METHYLPREDNISOLONE ACETATE 80 MG/ML IJ SUSP
80.0000 mg | Freq: Once | INTRAMUSCULAR | Status: AC
  Administered 2021-09-21: 80 mg via INTRAMUSCULAR

## 2021-09-21 NOTE — Addendum Note (Signed)
Addended by: Alphonzo Dublin on: 09/21/2021 12:04 PM   Modules accepted: Orders

## 2021-09-21 NOTE — Progress Notes (Signed)
BP 131/85    Pulse 86    Ht 4\' 11"  (1.499 m)    Wt 223 lb (101.2 kg)    LMP 10/06/2013    SpO2 100%    BMI 45.04 kg/m    Subjective:   Patient ID: Rebecca Miles, female    DOB: April 21, 1966, 56 y.o.   MRN: [id number]  HPI: Rebecca Miles is a 56 y.o. female presenting on 09/21/2021 for Medical Management of Chronic Issues, Hypertension, Hypothyroidism, and Prediabetes   HPI Prediabetes Patient comes in today for recheck of his diabetes. Patient has been currently taking Ozempic, A1c is 5.5. Patient is not currently on an ACE inhibitor/ARB. Patient has seen an ophthalmologist this year. Patient denies any issues with their feet. The symptom started onset as an adult hypertension and hypothyroidism ARE RELATED TO DM   Hypertension Patient is currently on hydrochlorothiazide and Benicar, and their blood pressure today is 131/85. Patient denies any lightheadedness or dizziness. Patient denies headaches, blurred vision, chest pains, shortness of breath, or weakness. Denies any side effects from medication and is content with current medication.   Hypothyroidism recheck Patient is coming in for thyroid recheck today as well. They deny any issues with hair changes or heat or cold problems or diarrhea or constipation. They deny any chest pain or palpitations. They are currently on levothyroxine 18micrograms   Bilateral knee osteoarthritis Has bilateral knee osteoarthritis coming in today because she wants an injection into her knees.  It works well when she gets injections and she wants to repeat injections for  Relevant past medical, surgical, family and social history reviewed and updated as indicated. Interim medical history since our last visit reviewed. Allergies and medications reviewed and updated.  Review of Systems  Constitutional:  Negative for chills and fever.  Eyes:  Negative for visual disturbance.  Respiratory:  Negative for chest tightness and shortness of breath.    Cardiovascular:  Negative for chest pain and leg swelling.  Musculoskeletal:  Negative for back pain and gait problem.  Skin:  Negative for rash.  Neurological:  Negative for light-headedness and headaches.  Psychiatric/Behavioral:  Negative for agitation and behavioral problems.   All other systems reviewed and are negative.  Per HPI unless specifically indicated above   Allergies as of 09/21/2021   No Known Allergies      Medication List        Accurate as of September 21, 2021 11:35 AM. If you have any questions, ask your nurse or doctor.          acetaminophen 650 MG CR tablet Commonly known as: TYLENOL Take 1,300 mg by mouth every 8 (eight) hours as needed for pain.   fluticasone 50 MCG/ACT nasal spray Commonly known as: FLONASE Place 2 sprays into both nostrils daily. What changed:  when to take this reasons to take this   hydrochlorothiazide 12.5 MG capsule Commonly known as: MICROZIDE Take 12.5 mg by mouth daily.   levothyroxine 125 MCG tablet Commonly known as: SYNTHROID Take 1 tablet (125 mcg total) by mouth daily.   Magnesium 250 MG Tabs Take 250 mg by mouth daily.   multivitamin with minerals Tabs tablet Take 1 tablet by mouth daily.   olmesartan 20 MG tablet Commonly known as: BENICAR Take 1 tablet (20 mg total) by mouth daily.   Ozempic (0.25 or 0.5 MG/DOSE) 2 MG/1.5ML Sopn Generic drug: Semaglutide(0.25 or 0.5MG /DOS) Inject 0.5 mg into the skin once a week.   phosphorus 155-852-130 MG tablet  Commonly known as: K PHOS NEUTRAL Take 1 tablet (250 mg total) by mouth 4 (four) times daily - after meals and at bedtime.   polyethylene glycol powder 17 GM/SCOOP powder Commonly known as: GLYCOLAX/MIRALAX Take 17 g by mouth daily.   pravastatin 20 MG tablet Commonly known as: PRAVACHOL Take 1 tablet (20 mg total) by mouth daily.   traMADol 50 MG tablet Commonly known as: ULTRAM Take 1 tablet (50 mg total) by mouth every 6 (six) hours as  needed for moderate pain.   triamcinolone cream 0.1 % Commonly known as: KENALOG APPLY TO THE AFFECTED AREA(S) TOPICALLY TWICE DAILY         Objective:   BP 131/85    Pulse 86    Ht 4\' 11"  (1.499 m)    Wt 223 lb (101.2 kg)    LMP 10/06/2013    SpO2 100%    BMI 45.04 kg/m   Wt Readings from Last 3 Encounters:  09/21/21 223 lb (101.2 kg)  06/20/21 223 lb (101.2 kg)  03/01/21 222 lb (100.7 kg)    Physical Exam Vitals and nursing note reviewed.  Constitutional:      General: She is not in acute distress.    Appearance: She is well-developed. She is not diaphoretic.  Eyes:     Conjunctiva/sclera: Conjunctivae normal.  Cardiovascular:     Rate and Rhythm: Normal rate and regular rhythm.     Heart sounds: Normal heart sounds. No murmur heard. Pulmonary:     Effort: Pulmonary effort is normal. No respiratory distress.     Breath sounds: Normal breath sounds. No wheezing.  Musculoskeletal:        General: No tenderness. Normal range of motion.  Skin:    General: Skin is warm and dry.     Findings: No rash.  Neurological:     Mental Status: She is alert and oriented to person, place, and time.     Coordination: Coordination normal.  Psychiatric:        Behavior: Behavior normal.      Assessment & Plan:   Problem List Items Addressed This Visit       Cardiovascular and Mediastinum   Hypertension     Endocrine   Hypothyroidism - Primary   Relevant Medications   levothyroxine (SYNTHROID) 125 MCG tablet   Other Relevant Orders   TSH     Other   Prediabetes   Relevant Orders   Bayer DCA Hb A1c Waived    A1c 5.5, looks good, continue current medicine.   Follow up plan: Return in about 3 months (around 12/20/2021), or if symptoms worsen or fail to improve, for dm and hypothyroid.  Counseling provided for all of the vaccine components Orders Placed This Encounter  Procedures   TSH   Bayer [place] Hb A1c [place] Conny Situ, MD [facility] 09/21/2021, 11:35 AM

## 2021-09-22 LAB — TSH: TSH: 2.64 u[IU]/mL (ref 0.450–4.500)

## 2021-09-28 ENCOUNTER — Other Ambulatory Visit: Payer: Self-pay | Admitting: Family Medicine

## 2021-10-20 ENCOUNTER — Telehealth: Payer: Self-pay

## 2021-10-20 DIAGNOSIS — Z5329 Procedure and treatment not carried out because of patient's decision for other reasons: Secondary | ICD-10-CM | POA: Diagnosis not present

## 2021-10-20 DIAGNOSIS — R079 Chest pain, unspecified: Secondary | ICD-10-CM | POA: Diagnosis not present

## 2021-10-20 NOTE — Telephone Encounter (Signed)
States around 4:30-5 had chest pains on the left to middle side of her chest. She says bp was around 130/80 and she took a 150mg  ASPIRIN and this relieved the pain. The only availibity we had was at 2:25. I spoke with another nurse Caryl Pina cross and [employer] they both felt like 2:25 would be too long to wait to be seen since the aspirin did help with sx. I directed pt to go to er to get seen. She said ok she would and we disconnected

## 2021-10-20 NOTE — Telephone Encounter (Signed)
Okay I agree, with the symptoms it would be good for her to get a full evaluation.

## 2021-10-29 ENCOUNTER — Other Ambulatory Visit: Payer: Self-pay | Admitting: Family Medicine

## 2021-10-31 NOTE — Telephone Encounter (Signed)
Last office visit 09/21/21 Last refill 09/02/21, 30 grams, 1 refill

## 2021-11-24 ENCOUNTER — Other Ambulatory Visit: Payer: Self-pay | Admitting: Family Medicine

## 2021-12-05 ENCOUNTER — Other Ambulatory Visit: Payer: Self-pay

## 2021-12-05 ENCOUNTER — Ambulatory Visit
Admission: RE | Admit: 2021-12-05 | Discharge: 2021-12-05 | Disposition: A | Discharge status: Home / Self Care | Payer: Medicare HMO | Source: Ambulatory Visit | Attending: Family Medicine | Admitting: Family Medicine

## 2021-12-05 DIAGNOSIS — Z1231 Encounter for screening mammogram for malignant neoplasm of breast: Secondary | ICD-10-CM | POA: Diagnosis not present

## 2021-12-20 ENCOUNTER — Other Ambulatory Visit: Payer: Self-pay | Admitting: Family Medicine

## 2021-12-26 ENCOUNTER — Encounter: Payer: Self-pay | Admitting: Family Medicine

## 2021-12-26 ENCOUNTER — Ambulatory Visit (INDEPENDENT_AMBULATORY_CARE_PROVIDER_SITE_OTHER): Payer: Medicare HMO | Admitting: Family Medicine

## 2021-12-26 VITALS — BP 127/87 | HR 93 | Ht 59.0 in | Wt 221.0 lb

## 2021-12-26 DIAGNOSIS — M174 Other bilateral secondary osteoarthritis of knee: Secondary | ICD-10-CM

## 2021-12-26 DIAGNOSIS — I10 Essential (primary) hypertension: Secondary | ICD-10-CM

## 2021-12-26 DIAGNOSIS — R7303 Prediabetes: Secondary | ICD-10-CM

## 2021-12-26 DIAGNOSIS — E550 Rickets, active: Secondary | ICD-10-CM | POA: Diagnosis not present

## 2021-12-26 DIAGNOSIS — E039 Hypothyroidism, unspecified: Secondary | ICD-10-CM

## 2021-12-26 LAB — BAYER DCA HB A1C WAIVED: HB A1C (BAYER DCA - WAIVED): 5.1 % (ref 4.8–5.6)

## 2021-12-26 MED ORDER — TRAMADOL HCL 50 MG PO TABS: 50.0000 mg | ORAL_TABLET | Freq: Four times a day (QID) | ORAL | 1 refills | Status: DC | PRN

## 2021-12-26 MED ORDER — OLMESARTAN MEDOXOMIL 20 MG PO TABS: 20.0000 mg | ORAL_TABLET | Freq: Every day | ORAL | 3 refills | Status: DC

## 2021-12-26 MED ORDER — PRAVASTATIN SODIUM 20 MG PO TABS: 20.0000 mg | ORAL_TABLET | Freq: Every day | ORAL | 3 refills | Status: DC

## 2021-12-26 MED ORDER — OZEMPIC (0.25 OR 0.5 MG/DOSE) 2 MG/1.5ML ~~LOC~~ SOPN: 0.5000 mg | PEN_INJECTOR | SUBCUTANEOUS | 2 refills | Status: DC

## 2021-12-26 MED ORDER — K PHOS MONO-SOD PHOS DI & MONO 155-852-130 MG PO TABS: 250.0000 mg | ORAL_TABLET | Freq: Three times a day (TID) | ORAL | 2 refills | Status: DC

## 2021-12-26 NOTE — Progress Notes (Signed)
? ?BP 127/87   Pulse 93   Ht '4\' 11"'$  (1.499 m)   Wt 221 lb (100.2 kg)   LMP 10/06/2013   SpO2 98%   BMI 44.64 kg/m?   ? ?Subjective:  ? ?Patient ID: Rebecca Miles, female    DOB: 06/09/1966, 56 y.o.   MRN: [id number] ? ?HPI: ?Rebecca Miles is a 56 y.o. female presenting on 12/26/2021 for Medical Management of Chronic Issues, Hypothyroidism, Hypertension, Prediabetes, and Vitiligo ? ? ?HPI ?Osteoarthritis due to rickets ?Pain assessment: ?Cause of pain-osteoarthritis of both knees due to rickets ?Pain location-both knees and hips ?Pain on scale of 1-10- 3 ?Frequency-Daily ?What increases pain-walking ?What makes pain Better-tramadol and rest ?Effects on ADL -significant limited with walking because of arthritis in the knees and hips ?Any change in general medical condition-none ? ?Current opioids rx-tramadol 50 mg every 6 hours as needed ?# meds rx-30, uses infrequently ?Effectiveness of current meds-works well ?Adverse reactions from pain meds-none ?Morphine equivalent-20 ? ?Pill count performed-No ?Last drug screen -03/03/2021 ?( high risk q86m moderate risk q670mlow risk yearly ) ?Urine drug screen today- No ?Was the NCA[place]ed-yes ? If yes were their any concerning findings? -None ? ?Pain contract signed on: 03/03/2021 ? ?Prediabetes  ?patient comes in today for recheck of his diabetes. Patient has been currently taking diet controlled. Patient is currently on an ACE inhibitor/ARB. Patient has not seen an ophthalmologist this year. Patient denies any issues with their feet. The symptom started onset as an adult htn and hld and thyroid ARE RELATED TO DM  ? ?Hypertension ?Patient is currently on olmesartan, and their blood pressure today is 127/87. Patient denies any lightheadedness or dizziness. Patient denies headaches, blurred vision, chest pains, shortness of breath, or weakness. Denies any side effects from medication and is content with current medication.  ? ?Hyperlipidemia ?Patient is coming  in for recheck of his hyperlipidemia. The patient is currently taking pravastatin. They deny any issues with myalgias or history of liver damage from it. They deny any focal numbness or weakness or chest pain.  ? ?Hypothyroidism recheck ?Patient is coming in for thyroid recheck today as well. They deny any issues with hair changes or heat or cold problems or diarrhea or constipation. They deny any chest pain or palpitations. They are currently on levothyroxine levothyroxine micrograms  ? ?Relevant past medical, surgical, family and social history reviewed and updated as indicated. Interim medical history since our last visit reviewed. ?Allergies and medications reviewed and updated. ? ?Review of Systems  ?Constitutional:  Negative for chills and fever.  ?Eyes:  Negative for visual disturbance.  ?Respiratory:  Negative for chest tightness and shortness of breath.   ?Cardiovascular:  Negative for chest pain and leg swelling.  ?Musculoskeletal:  Positive for arthralgias and gait problem. Negative for back pain.  ?Skin:  Negative for rash.  ?Neurological:  Negative for dizziness, light-headedness and headaches.  ?Psychiatric/Behavioral:  Negative for agitation and behavioral problems.   ?All other systems reviewed and are negative. ? ?Per HPI unless specifically indicated above ? ? ?Allergies as of 12/26/2021   ?No Known Allergies ?  ? ?  ?Medication List  ?  ? ?  ? Accurate as of December 26, 2021 12:14 PM. If you have any questions, ask your nurse or doctor.  ?  ?  ? ?  ? ?acetaminophen 650 MG CR tablet ?Commonly known as: TYLENOL ?Take 1,300 mg by mouth every 8 (eight) hours as needed for pain. ?  ?fluticasone  50 MCG/ACT nasal spray ?Commonly known as: FLONASE ?Place 2 sprays into both nostrils daily. ?What changed:  ?when to take this ?reasons to take this ?  ?hydrochlorothiazide 12.5 MG capsule ?Commonly known as: MICROZIDE ?Take 12.5 mg by mouth daily. ?  ?levothyroxine 125 MCG tablet ?Commonly known as:  SYNTHROID ?Take 1 tablet (125 mcg total) by mouth daily. ?  ?Magnesium 250 MG Tabs ?Take 250 mg by mouth daily. ?  ?multivitamin with minerals Tabs tablet ?Take 1 tablet by mouth daily. ?  ?olmesartan 20 MG tablet ?Commonly known as: BENICAR ?Take 1 tablet (20 mg total) by mouth daily. ?  ?Ozempic (0.25 or 0.5 MG/DOSE) 2 MG/1.5ML Sopn ?Generic drug: Semaglutide(0.25 or 0.'5MG'$ /DOS) ?Inject 0.5 mg into the skin once a week. ?  ?phosphorus 155-852-130 MG tablet ?Commonly known as: K PHOS NEUTRAL ?Take 1 tablet (250 mg total) by mouth 4 (four) times daily - after meals and at bedtime. ?  ?polyethylene glycol powder 17 GM/SCOOP powder ?Commonly known as: GLYCOLAX/MIRALAX ?Take 17 g by mouth daily. ?  ?pravastatin 20 MG tablet ?Commonly known as: PRAVACHOL ?Take 1 tablet (20 mg total) by mouth daily. ?  ?traMADol 50 MG tablet ?Commonly known as: ULTRAM ?Take 1 tablet (50 mg total) by mouth every 6 (six) hours as needed for moderate pain. ?  ?triamcinolone cream 0.1 % ?Commonly known as: KENALOG ?APPLY TO AFFECTED AREA TWICE A DAY ?  ? ?  ? ? ? ?Objective:  ? ?BP 127/87   Pulse 93   Ht '4\' 11"'$  (1.499 m)   Wt 221 lb (100.2 kg)   LMP 10/06/2013   SpO2 98%   BMI 44.64 kg/m?   ?Wt Readings from Last 3 Encounters:  ?12/26/21 221 lb (100.2 kg)  ?09/21/21 223 lb (101.2 kg)  ?06/20/21 223 lb (101.2 kg)  ?  ?Physical Exam ?Vitals and nursing note reviewed.  ?Constitutional:   ?   General: She is not in acute distress. ?   Appearance: She is well-developed. She is not diaphoretic.  ?Eyes:  ?   Conjunctiva/sclera: Conjunctivae normal.  ?Cardiovascular:  ?   Rate and Rhythm: Normal rate and regular rhythm.  ?   Heart sounds: Normal heart sounds. No murmur heard. ?Pulmonary:  ?   Effort: Pulmonary effort is normal. No respiratory distress.  ?   Breath sounds: Normal breath sounds. No wheezing.  ?Musculoskeletal:     ?   General: No tenderness. Normal range of motion.  ?Skin: ?   General: Skin is warm and dry.  ?   Findings: Rash  (Hypopigmented areas on both her hands and her face and her feet and legs) present.  ?Neurological:  ?   Mental Status: She is alert and oriented to person, place, and time.  ?   Coordination: Coordination normal.  ?Psychiatric:     ?   Behavior: Behavior normal.  ? ? ?A1c looks good at 5.2 ? ?Assessment & Plan:  ? ?Problem List Items Addressed This Visit   ? ?  ? Cardiovascular and Mediastinum  ? Hypertension  ? Relevant Medications  ? olmesartan (BENICAR) 20 MG tablet  ? pravastatin (PRAVACHOL) 20 MG tablet  ? Other Relevant Orders  ? Lipid panel  ? TSH  ? Bayer DCA Hb A1c Waived  ?  ? Endocrine  ? Hypothyroidism - Primary  ? Relevant Orders  ? Lipid panel  ? TSH  ? Bayer DCA Hb A1c Waived  ?  ? Musculoskeletal and Integument  ? OA (osteoarthritis) of  knee  ? Relevant Medications  ? traMADol (ULTRAM) 50 MG tablet  ? Clinical rickets  ?  ? Other  ? Prediabetes  ? Relevant Orders  ? Lipid panel  ? TSH  ? Bayer DCA Hb A1c Waived  ?  ?Blood pressure and A1c look good, everything looks well, continue current medicine, sent refills for her. ?Follow up plan: ?Return in about 3 months (around 03/27/2022), or if symptoms worsen or fail to improve, for Thyroid and prediabetes. ? ?Counseling provided for all of the vaccine components ?Orders Placed This Encounter  ?Procedures  ? Lipid panel  ? TSH  ? Bayer DCA Hb A1c Waived  ? ? ?Caryl Pina, MD ?[place] ?12/26/2021, 12:14 PM ? ? ? ? ?

## 2021-12-27 LAB — LIPID PANEL
Chol/HDL Ratio: 3.8 ratio (ref 0.0–4.4)
Cholesterol, Total: 166 mg/dL (ref 100–199)
HDL: 44 mg/dL (ref >39)
LDL Chol Calc (NIH): 104 mg/dL — ABNORMAL HIGH (ref 0–99)
Triglycerides: 99 mg/dL (ref 0–149)
VLDL Cholesterol Cal: 18 mg/dL (ref 5–40)

## 2021-12-27 LAB — TSH: TSH: 1.97 u[IU]/mL (ref 0.450–4.500)

## 2022-01-10 ENCOUNTER — Other Ambulatory Visit: Payer: Self-pay | Admitting: Family Medicine

## 2022-02-09 ENCOUNTER — Telehealth: Payer: Self-pay | Admitting: Family Medicine

## 2022-02-09 NOTE — Telephone Encounter (Signed)
Next steps?

## 2022-02-09 NOTE — Telephone Encounter (Signed)
Patient is wanting a partial hysterectomy due to issues that have been discussed with Dr Dettinger in the past per patient. Please call back and discuss next steps.

## 2022-02-09 NOTE — Telephone Encounter (Signed)
Neck steps for her would be to call for gynecology, usually they do not require referral, if they do require referral then please let us know but most of the time she can just call the gynecologist and they will see her

## 2022-02-10 NOTE — Telephone Encounter (Signed)
lmtcb

## 2022-02-16 ENCOUNTER — Telehealth: Payer: Self-pay | Admitting: Family Medicine

## 2022-02-16 NOTE — Telephone Encounter (Signed)
LM to [facility]  

## 2022-02-16 NOTE — Telephone Encounter (Signed)
Left message to return call 

## 2022-02-16 NOTE — Telephone Encounter (Signed)
Message already in 

## 2022-02-17 NOTE — Telephone Encounter (Signed)
Several attempts have been made to patient with no call back This encounter will be closed

## 2022-03-02 ENCOUNTER — Telehealth: Payer: Self-pay

## 2022-03-02 ENCOUNTER — Ambulatory Visit (INDEPENDENT_AMBULATORY_CARE_PROVIDER_SITE_OTHER): Payer: Medicare HMO

## 2022-03-02 VITALS — Wt 221.0 lb

## 2022-03-02 DIAGNOSIS — N814 Uterovaginal prolapse, unspecified: Secondary | ICD-10-CM

## 2022-03-02 DIAGNOSIS — Z0000 Encounter for general adult medical examination without abnormal findings: Secondary | ICD-10-CM | POA: Diagnosis not present

## 2022-03-02 DIAGNOSIS — R32 Unspecified urinary incontinence: Secondary | ICD-10-CM

## 2022-03-02 NOTE — Telephone Encounter (Signed)
Referral placed. Pt aware. 

## 2022-03-02 NOTE — Addendum Note (Signed)
Addended by: Alphonzo Dublin on: 03/02/2022 01:36 PM   Modules accepted: Orders

## 2022-03-02 NOTE — Progress Notes (Signed)
Subjective:   Rebecca Miles is a 56 y.o. female who presents for Medicare Annual (Subsequent) preventive examination.  Virtual Visit via Telephone Note  I connected with  Rebecca Miles on 03/02/22 at  9:45 AM EDT by telephone and verified that I am speaking with the correct person using two identifiers.  Location: Patient: Home Provider: [facility] Persons participating in the virtual visit: patient/Nurse Health Advisor   I discussed the limitations, risks, security and privacy concerns of performing an evaluation and management service by telephone and the availability of in person appointments. The patient expressed understanding and agreed to proceed.  Interactive audio and video telecommunications were attempted between this nurse and patient, however failed, due to patient having technical difficulties OR patient did not have access to video capability.  We continued and completed visit with audio only.  Some vital signs may be absent or patient reported.   Rebecca Miles E Rebecca Cisnero, LPN   Review of Systems     Cardiac Risk Factors include: advanced age (>57mn, >>20women);sedentary lifestyle;obesity (BMI >30kg/m2);dyslipidemia;hypertension     Objective:    Today's Vitals   03/02/22 0944  Weight: 221 lb (100.2 kg)  PainSc: 5    Body mass index is 44.64 kg/m.     03/02/2022    9:50 AM 03/01/2021    1:30 PM 07/15/2019    7:35 AM 05/29/2018    8:34 AM 10/25/2017    3:49 PM 09/26/2012    5:00 PM 09/17/2012    1:47 PM  Advanced Directives  Does Patient Have a Medical Advance Directive? No No No No No Patient does not have advance directive;Patient would not like information Patient does not have advance directive  Would patient like information on creating a medical advance directive? No - Patient declined Yes (MAU/Ambulatory/Procedural Areas - Information given) No - Patient declined  No - Patient declined    Pre-existing out of facility DNR order (yellow form or pink MOST form)       No     Current Medications (verified) Outpatient Encounter Medications as of 03/02/2022  Medication Sig   acetaminophen (TYLENOL) 650 MG CR tablet Take 1,300 mg by mouth every 8 (eight) hours as needed for pain.   fluticasone (FLONASE) 50 MCG/ACT nasal spray Place 2 sprays into both nostrils daily. (Patient taking differently: Place 2 sprays into both nostrils as needed for allergies.)   hydrochlorothiazide (MICROZIDE) 12.5 MG capsule Take 12.5 mg by mouth daily.   levothyroxine (SYNTHROID) 125 MCG tablet Take 1 tablet (125 mcg total) by mouth daily.   Magnesium 250 MG TABS Take 250 mg by mouth daily.   Multiple Vitamin (MULTIVITAMIN WITH MINERALS) TABS tablet Take 1 tablet by mouth daily.   olmesartan (BENICAR) 20 MG tablet Take 1 tablet (20 mg total) by mouth daily.   phosphorus (K PHOS NEUTRAL) 155-852-130 MG tablet Take 1 tablet (250 mg total) by mouth 4 (four) times daily - after meals and at bedtime.   polyethylene glycol powder (GLYCOLAX/MIRALAX) 17 GM/SCOOP powder Take 17 g by mouth daily.   pravastatin (PRAVACHOL) 20 MG tablet Take 1 tablet (20 mg total) by mouth daily.   traMADol (ULTRAM) 50 MG tablet Take 1 tablet (50 mg total) by mouth every 6 (six) hours as needed for moderate pain.   triamcinolone cream (KENALOG) 0.1 % APPLY TO AFFECTED AREA TWICE A DAY   Semaglutide,0.25 or 0.'5MG'$ /DOS, (OZEMPIC, 0.25 OR 0.5 MG/DOSE,) 2 MG/1.5ML SOPN Inject 0.5 mg into the skin once a week. (Patient  not taking: Reported on 03/02/2022)   No facility-administered encounter medications on file as of 03/02/2022.    Allergies (verified) Patient has no known allergies.   History: Past Medical History:  Diagnosis Date   Allergy    Arthritis    Hypertension    Hypothyroidism    Osteoarthritis    ankles and joints   Rickets    Past Surgical History:  Procedure Laterality Date   CESAREAN SECTION     COLONOSCOPY N/A 07/15/2019   Diverticulosis in cecum, three 4-6 mm polyps s/p removal.  Tubular adenomas, sessile serrated polyps without dysplasia. 3 year surveillance.    FEMUR IM NAIL  09/26/2012   Procedure: INTRAMEDULLARY (IM) RETROGRADE FEMORAL NAILING;  Surgeon: Gearlean Alf, MD;  Location: [facility];  Service: Orthopedics;  Laterality: Right;   LEG SURGERY     bilateral at age 48    POLYPECTOMY  07/15/2019   Procedure: POLYPECTOMY;  Surgeon: Daneil Dolin, MD;  Location: [facility];  Service: Endoscopy;;  cecal    TIBIA OSTEOTOMY  09/26/2012   Procedure: TIBIAL OSTEOTOMY;  Surgeon: Gearlean Alf, MD;  Location: [facility];  Service: Orthopedics;  Laterality: Right;  RIGHT FEMORAL CORRECTIVE OSTEOMITY WITH RETROGRADE IM FEMORAL NAIL   TUBAL LIGATION     TUBAL LIGATION     Family History  Problem Relation Age of Onset   Diabetes Mother    Hypertension Mother    Kidney disease Mother    Parkinson's disease Father    Lupus Sister    Arthritis Maternal Grandmother    Hypertension Maternal Grandmother    Kidney disease Maternal Grandmother    Drug abuse Brother    Early death Brother    Stroke Brother    Graves' disease Son    Myasthenia gravis Son    Rickets Son    Rickets Son    Rickets Son    ADD / ADHD Son    Colon cancer Neg Hx    Colon polyps Neg Hx    Breast cancer Neg Hx    Social History   Socioeconomic History   Marital status: Married    Spouse name: Alvester Chou   Number of children: 4   Years of education: 13   Highest education level: Some college, no degree  Occupational History   Occupation: disabled  Tobacco Use   Smoking status: Former    Packs/day: 0.50    Years: 20.00    Total pack years: 10.00    Types: Cigarettes    Quit date: 09/12/1999    Years since quitting: 22.4   Smokeless tobacco: Never   Tobacco comments:    No current tobacco use  Vaping Use   Vaping Use: Never used  Substance and Sexual Activity   Alcohol use: Yes    Comment: occasional   Drug use: No   Sexual activity: Not Currently  Other Topics Concern   Not on  file  Social History Narrative   Married; All 4 of her children and 2 grandchildren live with her.   Social Determinants of Health   Financial Resource Strain: Low Risk  (03/02/2022)   Overall Financial Resource Strain (CARDIA)    Difficulty of Paying Living Expenses: Not hard at all  Food Insecurity: No Food Insecurity (03/02/2022)   Hunger Vital Sign    Worried About Running Out of Food in the Last Year: Never true    Ran Out of Food in the Last Year: Never true  Transportation Needs:  No Transportation Needs (03/02/2022)   PRAPARE - Hydrologist (Medical): No    Lack of Transportation (Non-Medical): No  Physical Activity: Insufficiently Active (03/02/2022)   Exercise Vital Sign    Days of Exercise per Week: 7 days    Minutes of Exercise per Session: 10 min  Stress: No Stress Concern Present (03/02/2022)   [place]    Feeling of Stress : Only a little  Social Connections: Socially Integrated (03/02/2022)   Social Connection and Isolation Panel [NHANES]    Frequency of Communication with Friends and Family: More than three times a week    Frequency of Social Gatherings with Friends and Family: More than three times a week    Attends Religious Services: More than 4 times per year    Active Member of [employer] or Organizations: Yes    Attends Music therapist: More than 4 times per year    Marital Status: Married    Tobacco Counseling Counseling given: Not Answered Tobacco comments: No current tobacco use   Clinical Intake:  Pre-visit preparation completed: Yes  Pain : 0-10 Pain Score: 5  Pain Type: Chronic pain Pain Location: Leg Pain Orientation: Right, Left Pain Descriptors / Indicators: Aching, Discomfort Pain Onset: More than a month ago Pain Frequency: Constant     BMI - recorded: 44.64 Nutritional Status: BMI > 30  Obese Nutritional Risks: None Diabetes:  No  How often do you need to have someone help you when you read instructions, pamphlets, or other written materials from your doctor or pharmacy?: 1 - Never  Diabetic? no  Interpreter Needed?: No  Information entered by :: Asahd Can, LPN   Activities of Daily Living    03/02/2022    9:52 AM  In your present state of health, do you have any difficulty performing the following activities:  Hearing? 0  Vision? 0  Difficulty concentrating or making decisions? 0  Walking or climbing stairs? 1  Dressing or bathing? 0  Doing errands, shopping? 0  Preparing Food and eating ? N  Using the Toilet? N  In the past six months, have you accidently leaked urine? Y  Comment wears pads for protection  Do you have problems with loss of bowel control? N  Managing your Medications? N  Managing your Finances? N  Housekeeping or managing your Housekeeping? N    Patient Care Team: Dettinger, Fransisca Kaufmann, MD as PCP - General (Family Medicine) Gala Romney Cristopher Estimable, MD as Consulting Physician (Gastroenterology) Gaynelle Arabian, MD as Consulting Physician (Orthopedic Surgery)  Indicate any recent Medical Services you may have received from other than [facility] providers in the past year (date may be approximate).     Assessment:   This is a routine wellness examination for Margeart.  Hearing/Vision screen Hearing Screening - Comments:: Denies hearing difficulties   Vision Screening - Comments:: Wears otc reading glasses prn only - behind with routine eye exams with  [facility]  Dietary issues and exercise activities discussed: Current Exercise Habits: Home exercise routine, Type of exercise: walking;stretching, Time (Minutes): 10, Frequency (Times/Week): 7, Weekly Exercise (Minutes/Week): 70, Intensity: Mild, Exercise limited by: orthopedic condition(s)   Goals Addressed             This Visit's Progress    Patient Stated   Not on track    She would like to lose weight, is [facility]obics at [facility], wants to control sugar before  she gets diabetes (recently dx as pre-diabetic)       Depression Screen    03/02/2022    9:49 AM 12/26/2021   11:11 AM 09/21/2021   10:31 AM 03/01/2021    1:28 PM 12/01/2020    2:24 PM 11/26/2020    9:20 AM 07/08/2020   11:21 AM  PHQ 2/9 Scores  PHQ - 2 Score 0 0 0 0 0 0 0  PHQ- 9 Score  2         Fall Risk    03/02/2022    9:47 AM 12/26/2021   11:11 AM 09/21/2021   10:31 AM 06/20/2021   10:21 AM 03/01/2021    1:24 PM  Fall Risk   Falls in the past year? 1 0 0 0 1  Number falls in past yr: 1    1  Injury with Fall? 1    1  Risk for fall due to : History of fall(s);Impaired balance/gait;Orthopedic patient    Impaired mobility;Impaired vision;Orthopedic patient;History of fall(s)  Follow up Education provided;Falls prevention discussed    Education provided;Falls prevention discussed    FALL RISK PREVENTION PERTAINING TO THE HOME:  Any stairs in or around the home? Yes  If so, are there any without handrails? No  Home free of loose throw rugs in walkways, pet beds, electrical cords, etc? Yes  Adequate lighting in your home to reduce risk of falls? Yes   ASSISTIVE DEVICES UTILIZED TO PREVENT FALLS:  Life alert? No  Use of a cane, walker or w/c? Yes  Grab bars in the bathroom? Yes  Shower chair or bench in shower? No  Elevated toilet seat or a handicapped toilet? No   TIMED UP AND GO:  Was the test performed? No . Telephonic visit  Cognitive Function:    10/25/2017    3:55 PM  MMSE - Mini Mental State Exam  Orientation to time 5  Orientation to Place 5  Registration 3  Attention/ Calculation 5  Recall 3  Language- name 2 objects 2  Language- repeat 1  Language- follow 3 step command 3  Language- read & follow direction 1  Write a sentence 1  Copy design 1  Total score 30        03/02/2022    9:53 AM 03/01/2021    1:32 PM  6CIT Screen  What Year? 0 points 0 points  What month? 0 points 0 points  What time? 0  points 0 points  Count back from 20 0 points 0 points  Months in reverse 2 points 2 points  Repeat phrase 0 points 4 points  Total Score 2 points 6 points    Immunizations Immunization History  Administered Date(s) Administered   DTaP 05/07/1969, 06/18/1969, 09/17/1969, 06/17/1970, 06/16/1971   Hepatitis B 03/14/1999, 04/15/1999, 04/29/2001   IPV 06/17/1970, 06/16/1971, 06/26/1974   Influenza, Quadrivalent, Recombinant, Inj, Pf 06/19/2019   Influenza,inj,Quad PF,6+ Mos 06/22/2017, 06/19/2018, 07/08/2020, 06/20/2021   Influenza-Unspecified 06/19/2019   Moderna Sars-Covid-2 Vaccination 11/04/2019, 11/25/2019   Rubella 11/05/1969   Td 04/30/1996   Tdap 04/11/2008, 05/11/2021    TDAP status: Up to date  Flu Vaccine status: Up to date  Pneumococcal vaccine status: Due, Education has been provided regarding the importance of this vaccine. Advised may receive this vaccine at local pharmacy or Health Dept. Aware to provide a copy of the vaccination record if obtained from local pharmacy or Health Dept. Verbalized acceptance and understanding.  Covid-19 vaccine status: Completed vaccines  Qualifies for Shingles Vaccine?  Yes   Zostavax completed No   Shingrix Completed?: No.    Education has been provided regarding the importance of this vaccine. Patient has been advised to call insurance company to determine out of pocket expense if they have not yet received this vaccine. Advised may also receive vaccine at local pharmacy or Health Dept. Verbalized acceptance and understanding.  Screening Tests Health Maintenance  Topic Date Due   Zoster Vaccines- Shingrix (1 of 2) 06/26/2022 (Originally 09/13/2017)   COVID-19 Vaccine (3 - Moderna series) 12/25/2022 (Originally 01/20/2020)   INFLUENZA VACCINE  04/11/2022   COLONOSCOPY (Pts 45-58yr Insurance coverage will need to be confirmed)  07/14/2022   MAMMOGRAM  12/06/2022   PAP SMEAR-Modifier  12/02/2023   TETANUS/TDAP  05/12/2031   HPV  VACCINES  Aged Out   Hepatitis C Screening  Discontinued   HIV Screening  Discontinued    Health Maintenance  There are no preventive care reminders to display for this patient.  Colorectal cancer screening: Type of screening: Colonoscopy. Completed 07/15/2019. Repeat every 3 years  Mammogram status: Completed 12/05/2021. Repeat every year  DEXA: due at age 56 Lung Cancer Screening: (Low Dose CT Chest recommended if Age 54-80years, 30 pack-year currently smoking OR have quit w/in 15years.) does not qualify.   Additional Screening:  Hepatitis C Screening: does not qualify  Vision Screening: Recommended annual ophthalmology exams for early detection of glaucoma and other disorders of the eye. Is the patient up to date with their annual eye exam?  No  Who is the provider or what is the name of the office in which the patient attends annual eye exams? [facility] pt is not established with a provider, would they like to be referred to a provider to establish care? No .   Dental Screening: Recommended annual dental exams for proper oral hygiene  Community Resource Referral / Chronic Care Management: CRR required this visit?  No   CCM required this visit?  No      Plan:     I have personally reviewed and noted the following in the patient's chart:   Medical and social history Use of alcohol, tobacco or illicit drugs  Current medications and supplements including opioid prescriptions.  Functional ability and status Nutritional status Physical activity Advanced directives List of other physicians Hospitalizations, surgeries, and ER visits in previous 12 months Vitals Screenings to include cognitive, depression, and falls Referrals and appointments  In addition, I have reviewed and discussed with patient certain preventive protocols, quality metrics, and best practice recommendations. A written personalized care plan for preventive services as well as general  preventive health recommendations were provided to patient.     ASandrea Hammond LPN   [id number]  Nurse Notes: She stopped Ozympic - thinks it caused her neck and shoulders to ache and get stiff - this went away after she stopped using it - She is afraid it was affecting her thyroid.

## 2022-03-02 NOTE — Patient Instructions (Signed)
Rebecca Miles , Thank you for taking time to come for your Medicare Wellness Visit. I appreciate your ongoing commitment to your health goals. Please review the following plan we discussed and let me know if I can assist you in the future.   Screening recommendations/referrals: Colonoscopy: Done 07/15/2019 - repeat in 3 years * due in 5 months! Mammogram: Done 12/05/2021 - Repeat annually Bone Density: Due at age 56 Recommended yearly ophthalmology/optometry visit for glaucoma screening and checkup Recommended yearly dental visit for hygiene and checkup  Vaccinations: Influenza vaccine: Done 06/20/2021 - Repeat annually  Pneumococcal vaccine: Due - recommend once per lifetime Prevnar-20 Tdap vaccine: Done 05/11/2021 - Repeat in 10 years  Shingles vaccine: Due - Shingrix is 2 doses 2-6 months apart and over 90% effective    Covid-19: done 11/04/2019 & 11/25/2019  Advanced directives: Advance directive discussed with you today. Even though you declined this today, please call our office should you change your mind, and we can give you the proper paperwork for you to fill out.   Conditions/risks identified: Aim for 4-6 glasses of water daily, plenty of lean protein and vegetables in your diet, and try to get up and walk/ stretch every hour for 5-10 minutes at a time.   Next appointment: Follow up in one year for your annual wellness visit.   Preventive Care 40-64 Years, Female Preventive care refers to lifestyle choices and visits with your health care provider that can promote health and wellness. What does preventive care include? A yearly physical exam. This is also called an annual well check. Dental exams once or twice a year. Routine eye exams. Ask your health care provider how often you should have your eyes checked. Personal lifestyle choices, including: Daily care of your teeth and gums. Regular physical activity. Eating a healthy diet. Avoiding tobacco and drug use. Limiting  alcohol use. Practicing safe sex. Taking low-dose aspirin daily starting at age 65. Taking vitamin and mineral supplements as recommended by your health care provider. What happens during an annual well check? The services and screenings done by your health care provider during your annual well check will depend on your age, overall health, lifestyle risk factors, and family history of disease. Counseling  Your health care provider may ask you questions about your: Alcohol use. Tobacco use. Drug use. Emotional well-being. Home and relationship well-being. Sexual activity. Eating habits. Work and work Statistician. Method of birth control. Menstrual cycle. Pregnancy history. Screening  You may have the following tests or measurements: Height, weight, and BMI. Blood pressure. Lipid and cholesterol levels. These may be checked every 5 years, or more frequently if you are over 40 years old. Skin check. Lung cancer screening. You may have this screening every year starting at age 41 if you have a 30-pack-year history of smoking and currently smoke or have quit within the past 15 years. Fecal occult blood test (FOBT) of the stool. You may have this test every year starting at age 11. Flexible sigmoidoscopy or colonoscopy. You may have a sigmoidoscopy every 5 years or a colonoscopy every 10 years starting at age 25. Hepatitis C blood test. Hepatitis B blood test. Sexually transmitted disease (STD) testing. Diabetes screening. This is done by checking your blood sugar (glucose) after you have not eaten for a while (fasting). You may have this done every 1-3 years. Mammogram. This may be done every 1-2 years. Talk to your health care provider about when you should start having regular mammograms. This may depend  on whether you have a family history of breast cancer. BRCA-related cancer screening. This may be done if you have a family history of breast, ovarian, tubal, or peritoneal  cancers. Pelvic exam and Pap test. This may be done every 3 years starting at age 71. Starting at age 83, this may be done every 5 years if you have a Pap test in combination with an HPV test. Bone density scan. This is done to screen for osteoporosis. You may have this scan if you are at high risk for osteoporosis. Discuss your test results, treatment options, and if necessary, the need for more tests with your health care provider. Vaccines  Your health care provider may recommend certain vaccines, such as: Influenza vaccine. This is recommended every year. Tetanus, diphtheria, and acellular pertussis (Tdap, Td) vaccine. You may need a Td booster every 10 years. Zoster vaccine. You may need this after age 65. Pneumococcal 13-valent conjugate (PCV13) vaccine. You may need this if you have certain conditions and were not previously vaccinated. Pneumococcal polysaccharide (PPSV23) vaccine. You may need one or two doses if you smoke cigarettes or if you have certain conditions. Talk to your health care provider about which screenings and vaccines you need and how often you need them. This information is not intended to replace advice given to you by your health care provider. Make sure you discuss any questions you have with your health care provider. Document Released: 09/24/2015 Document Revised: 05/17/2016 Document Reviewed: 06/29/2015 Elsevier Interactive Patient Education  2017 [employer] Prevention in the Home Falls can cause injuries. They can happen to people of all ages. There are many things you can do to make your home safe and to help prevent falls. What can I do on the outside of my home? Regularly fix the edges of walkways and driveways and fix any cracks. Remove anything that might make you trip as you walk through a door, such as a raised step or threshold. Trim any bushes or trees on the path to your home. Use bright outdoor lighting. Clear any walking paths of  anything that might make someone trip, such as rocks or tools. Regularly check to see if handrails are loose or broken. Make sure that both sides of any steps have handrails. Any raised decks and porches should have guardrails on the edges. Have any leaves, snow, or ice cleared regularly. Use sand or salt on walking paths during winter. Clean up any spills in your garage right away. This includes oil or grease spills. What can I do in the bathroom? Use night lights. Install grab bars by the toilet and in the tub and shower. Do not use towel bars as grab bars. Use non-skid mats or decals in the tub or shower. If you need to sit down in the shower, use a plastic, non-slip stool. Keep the floor dry. Clean up any water that spills on the floor as soon as it happens. Remove soap buildup in the tub or shower regularly. Attach bath mats securely with double-sided non-slip rug tape. Do not have throw rugs and other things on the floor that can make you trip. What can I do in the bedroom? Use night lights. Make sure that you have a light by your bed that is easy to reach. Do not use any sheets or blankets that are too big for your bed. They should not hang down onto the floor. Have a firm chair that has side arms. You can use  this for support while you get dressed. Do not have throw rugs and other things on the floor that can make you trip. What can I do in the kitchen? Clean up any spills right away. Avoid walking on wet floors. Keep items that you use a lot in easy-to-reach places. If you need to reach something above you, use a strong step stool that has a grab bar. Keep electrical cords out of the way. Do not use floor polish or wax that makes floors slippery. If you must use wax, use non-skid floor wax. Do not have throw rugs and other things on the floor that can make you trip. What can I do with my stairs? Do not leave any items on the stairs. Make sure that there are handrails on both  sides of the stairs and use them. Fix handrails that are broken or loose. Make sure that handrails are as long as the stairways. Check any carpeting to make sure that it is firmly attached to the stairs. Fix any carpet that is loose or worn. Avoid having throw rugs at the top or bottom of the stairs. If you do have throw rugs, attach them to the floor with carpet tape. Make sure that you have a light switch at the top of the stairs and the bottom of the stairs. If you do not have them, ask someone to add them for you. What else can I do to help prevent falls? Wear shoes that: Do not have high heels. Have rubber bottoms. Are comfortable and fit you well. Are closed at the toe. Do not wear sandals. If you use a stepladder: Make sure that it is fully opened. Do not climb a closed stepladder. Make sure that both sides of the stepladder are locked into place. Ask someone to hold it for you, if possible. Clearly mark and make sure that you can see: Any grab bars or handrails. First and last steps. Where the edge of each step is. Use tools that help you move around (mobility aids) if they are needed. These include: Canes. Walkers. Scooters. Crutches. Turn on the lights when you go into a dark area. Replace any light bulbs as soon as they burn out. Set up your furniture so you have a clear path. Avoid moving your furniture around. If any of your floors are uneven, fix them. If there are any pets around you, be aware of where they are. Review your medicines with your doctor. Some medicines can make you feel dizzy. This can increase your chance of falling. Ask your doctor what other things that you can do to help prevent falls. This information is not intended to replace advice given to you by your health care provider. Make sure you discuss any questions you have with your health care provider. Document Released: 06/24/2009 Document Revised: 02/03/2016 Document Reviewed: 10/02/2014 Elsevier  Interactive Patient Education  2017 [employer].

## 2022-03-02 NOTE — Telephone Encounter (Signed)
Go ahead and place referral to [facility] in [place] for possible prolapse

## 2022-03-02 NOTE — Telephone Encounter (Signed)
She feels like her uterus is prolapsed and has trouble with incontinence - used to go to [facility] in [place] - wants a referral to go back there

## 2022-03-04 ENCOUNTER — Other Ambulatory Visit: Payer: Self-pay | Admitting: Family Medicine

## 2022-03-11 ENCOUNTER — Other Ambulatory Visit: Payer: Self-pay | Admitting: Family Medicine

## 2022-03-25 ENCOUNTER — Other Ambulatory Visit: Payer: Self-pay | Admitting: Family Medicine

## 2022-03-27 ENCOUNTER — Ambulatory Visit: Payer: Medicare HMO | Admitting: Family Medicine

## 2022-03-27 DIAGNOSIS — Z6841 Body Mass Index (BMI) 40.0 and over, adult: Secondary | ICD-10-CM | POA: Diagnosis not present

## 2022-03-27 DIAGNOSIS — N814 Uterovaginal prolapse, unspecified: Secondary | ICD-10-CM | POA: Diagnosis not present

## 2022-03-27 DIAGNOSIS — R32 Unspecified urinary incontinence: Secondary | ICD-10-CM | POA: Diagnosis not present

## 2022-03-28 ENCOUNTER — Telehealth: Payer: Self-pay | Admitting: Family Medicine

## 2022-03-28 NOTE — Telephone Encounter (Signed)
Patient aware and states she is not taking hydrochlorothiazide.  She is only taking benicar.

## 2022-03-28 NOTE — Telephone Encounter (Signed)
Have patient take 2 tablets of hydrochlorothiazide taking it 50 mg.  Schedule patient with PCP tomorrow for hypertension management.

## 2022-03-28 NOTE — Telephone Encounter (Signed)
Patient is calling to let Dr Dettinger know that her bp is running high.   7/17 - 157/89 after meds  7/18 - 137/85 before meds, 140/90 after meds    Did not want to make appt at this time. Please call back and advise.

## 2022-03-29 ENCOUNTER — Other Ambulatory Visit: Payer: Self-pay | Admitting: Family Medicine

## 2022-04-17 ENCOUNTER — Other Ambulatory Visit: Payer: Self-pay | Admitting: Family Medicine

## 2022-05-04 ENCOUNTER — Encounter: Payer: Self-pay | Admitting: Family Medicine

## 2022-05-04 ENCOUNTER — Ambulatory Visit (INDEPENDENT_AMBULATORY_CARE_PROVIDER_SITE_OTHER): Payer: Medicare HMO | Admitting: Family Medicine

## 2022-05-04 VITALS — BP 138/87 | HR 85 | Temp 97.0°F | Ht 59.0 in | Wt 223.2 lb

## 2022-05-04 DIAGNOSIS — E039 Hypothyroidism, unspecified: Secondary | ICD-10-CM

## 2022-05-04 DIAGNOSIS — E550 Rickets, active: Secondary | ICD-10-CM | POA: Diagnosis not present

## 2022-05-04 DIAGNOSIS — R7303 Prediabetes: Secondary | ICD-10-CM | POA: Diagnosis not present

## 2022-05-04 DIAGNOSIS — I10 Essential (primary) hypertension: Secondary | ICD-10-CM | POA: Diagnosis not present

## 2022-05-04 LAB — BAYER DCA HB A1C WAIVED: HB A1C (BAYER DCA - WAIVED): 5.1 % (ref 4.8–5.6)

## 2022-05-04 MED ORDER — TRAMADOL HCL 50 MG PO TABS: 50.0000 mg | ORAL_TABLET | Freq: Four times a day (QID) | ORAL | 1 refills | Status: DC | PRN

## 2022-05-04 NOTE — Progress Notes (Signed)
BP 138/87   Pulse 85   Temp (!) 97 F (36.1 C)   Ht 4' 11"  (1.499 m)   Wt 223 lb 3.2 oz (101.2 kg)   LMP 10/06/2013   SpO2 98%   BMI 45.08 kg/m    Subjective:   Patient ID: Rebecca Miles, female    DOB: 08-Dec-1965, 56 y.o.   MRN: [id number]  HPI: GAYLYNN SEIPLE is a 56 y.o. female presenting on 05/04/2022 for Hypothyroidism   HPI Pain assessment: Cause of pain-rickets disease, bilateral knee osteoarthritis Pain location-both knees and hips Pain on scale of 1-10- 3 Frequency-Daily What increases pain-walking and standing What makes pain Better-rest and tramadol Effects on ADL -affects walking and standing and how long she can be on her feet Any change in general medical condition-none  Current opioids rx-tramadol 50 mg every 6 hours as needed # meds rx-30/month Effectiveness of current meds-works well, only uses occasionally Adverse reactions from pain meds-none Morphine equivalent-20  Pill count performed-No Last drug screen - none ( high risk q33m moderate risk q656mlow risk yearly ) Urine drug screen today- Yes Was the [place]-yes  If yes were their any concerning findings? -None  Pain contract signed on: 03/03/2021  Prediabetes  Patient comes in today for recheck of his diabetes. Patient has been currently taking no medication currently, diet controlled. Patient is currently on an ACE inhibitor/ARB. Patient has not seen an ophthalmologist this year. Patient denies any issues with their feet. The symptom started onset as an adult hypertension and hypothyroidism ARE RELATED TO DM   Hypertension Patient is currently on olmesartan and hydrochlorothiazide, and their blood pressure today is 138/87. Patient denies any lightheadedness or dizziness. Patient denies headaches, blurred vision, chest pains, shortness of breath, or weakness. Denies any side effects from medication and is content with current medication.   Hypothyroidism recheck Patient is  coming in for thyroid recheck today as well. They deny any issues with hair changes or heat or cold problems or diarrhea or constipation. They deny any chest pain or palpitations. They are currently on levothyroxine 125 micrograms   Relevant past medical, surgical, family and social history reviewed and updated as indicated. Interim medical history since our last visit reviewed. Allergies and medications reviewed and updated.  Review of Systems  Constitutional:  Negative for chills and fever.  Eyes:  Negative for redness and visual disturbance.  Respiratory:  Negative for chest tightness and shortness of breath.   Cardiovascular:  Negative for chest pain and leg swelling.  Genitourinary:  Negative for difficulty urinating and dysuria.  Musculoskeletal:  Positive for arthralgias, back pain, gait problem and myalgias.  Skin:  Negative for rash.  Neurological:  Negative for light-headedness and headaches.  Psychiatric/Behavioral:  Negative for agitation and behavioral problems.   All other systems reviewed and are negative.   Per HPI unless specifically indicated above   Allergies as of 05/04/2022   No Known Allergies      Medication List        Accurate as of May 04, 2022  3:40 PM. If you have any questions, ask your nurse or doctor.          acetaminophen 650 MG CR tablet Commonly known as: TYLENOL Take 1,300 mg by mouth every 8 (eight) hours as needed for pain.   fluticasone 50 MCG/ACT nasal spray Commonly known as: FLONASE Place 2 sprays into both nostrils daily. What changed:  when to take this reasons to take this  hydrochlorothiazide 12.5 MG capsule Commonly known as: MICROZIDE Take 12.5 mg by mouth daily.   levothyroxine 125 MCG tablet Commonly known as: SYNTHROID TAKE 1 TABLET BY MOUTH EVERY DAY   Magnesium 250 MG Tabs Take 250 mg by mouth daily.   multivitamin with minerals Tabs tablet Take 1 tablet by mouth daily.   olmesartan 20 MG  tablet Commonly known as: BENICAR Take 1 tablet (20 mg total) by mouth daily.   Ozempic (0.25 or 0.5 MG/DOSE) 2 MG/1.5ML Sopn Generic drug: Semaglutide(0.25 or 0.5MG/DOS) Inject 0.5 mg into the skin once a week.   Phospha 250 Neutral 155-852-130 MG Tabs TAKE 1 TABLET (250 MG TOTAL) BY MOUTH 4 (FOUR) TIMES DAILY - AFTER MEALS AND AT BEDTIME.   polyethylene glycol powder 17 GM/SCOOP powder Commonly known as: GLYCOLAX/MIRALAX Take 17 g by mouth daily.   pravastatin 20 MG tablet Commonly known as: PRAVACHOL Take 1 tablet (20 mg total) by mouth daily.   traMADol 50 MG tablet Commonly known as: ULTRAM Take 1 tablet (50 mg total) by mouth every 6 (six) hours as needed for moderate pain.   triamcinolone cream 0.1 % Commonly known as: KENALOG APPLY TO AFFECTED AREA TWICE A DAY         Objective:   BP 138/87   Pulse 85   Temp (!) 97 F (36.1 C)   Ht 4' 11"  (1.499 m)   Wt 223 lb 3.2 oz (101.2 kg)   LMP 10/06/2013   SpO2 98%   BMI 45.08 kg/m   Wt Readings from Last 3 Encounters:  05/04/22 223 lb 3.2 oz (101.2 kg)  03/02/22 221 lb (100.2 kg)  12/26/21 221 lb (100.2 kg)    Physical Exam Vitals and nursing note reviewed.  Constitutional:      General: She is not in acute distress.    Appearance: She is well-developed. She is not diaphoretic.  Eyes:     Conjunctiva/sclera: Conjunctivae normal.  Cardiovascular:     Rate and Rhythm: Normal rate and regular rhythm.     Heart sounds: Normal heart sounds. No murmur heard. Pulmonary:     Effort: Pulmonary effort is normal. No respiratory distress.     Breath sounds: Normal breath sounds. No wheezing.  Skin:    General: Skin is warm and dry.     Findings: No rash.  Neurological:     Mental Status: She is alert and oriented to person, place, and time.     Coordination: Coordination normal.  Psychiatric:        Behavior: Behavior normal.       Assessment & Plan:   Problem List Items Addressed This Visit        Cardiovascular and Mediastinum   Hypertension   Relevant Orders   CMP14+EGFR     Endocrine   Hypothyroidism   Relevant Orders   TSH     Musculoskeletal and Integument   Clinical rickets   Relevant Medications   traMADol (ULTRAM) 50 MG tablet   Other Relevant Orders   ToxASSURE Select 13 (MW), Urine     Other   Prediabetes - Primary   Relevant Orders   Bayer DCA Hb A1c Waived    We will check blood work today, continue current medicine, will do drug screen. Follow up plan: Return in about 3 months (around 08/04/2022), or if symptoms worsen or fail to improve, for Prediabetes and hypertension and thyroid.  Counseling provided for all of the vaccine components Orders Placed This Encounter  Procedures  ToxASSURE Select 13 (MW), Urine   Bayer DCA Hb A1c Waived   CMP14+EGFR   TSH    Caryl Pina, MD [facility] 05/04/2022, 3:40 PM

## 2022-05-05 LAB — CMP14+EGFR
ALT: 14 IU/L (ref 0–32)
AST: 18 IU/L (ref 0–40)
Albumin/Globulin Ratio: 1.9 (ref 1.2–2.2)
Albumin: 4.4 g/dL (ref 3.8–4.9)
Alkaline Phosphatase: 157 IU/L — ABNORMAL HIGH (ref 44–121)
BUN/Creatinine Ratio: 15 (ref 9–23)
BUN: 10 mg/dL (ref 6–24)
Bilirubin Total: 0.4 mg/dL (ref 0.0–1.2)
CO2: 21 mmol/L (ref 20–29)
Calcium: 9.6 mg/dL (ref 8.7–10.2)
Chloride: 105 mmol/L (ref 96–106)
Creatinine, Ser: 0.66 mg/dL (ref 0.57–1.00)
Globulin, Total: 2.3 g/dL (ref 1.5–4.5)
Glucose: 78 mg/dL (ref 70–99)
Potassium: 3.9 mmol/L (ref 3.5–5.2)
Sodium: 141 mmol/L (ref 134–144)
Total Protein: 6.7 g/dL (ref 6.0–8.5)
eGFR: 104 mL/min/{1.73_m2} (ref >59)

## 2022-05-05 LAB — TSH: TSH: 0.335 u[IU]/mL — ABNORMAL LOW (ref 0.450–4.500)

## 2022-05-08 ENCOUNTER — Other Ambulatory Visit: Payer: Self-pay | Admitting: *Deleted

## 2022-05-08 DIAGNOSIS — E039 Hypothyroidism, unspecified: Secondary | ICD-10-CM

## 2022-05-09 LAB — TOXASSURE SELECT 13 (MW), URINE

## 2022-05-26 ENCOUNTER — Ambulatory Visit (INDEPENDENT_AMBULATORY_CARE_PROVIDER_SITE_OTHER): Payer: Medicare HMO | Admitting: Family Medicine

## 2022-05-26 ENCOUNTER — Other Ambulatory Visit: Payer: Self-pay | Admitting: Family Medicine

## 2022-05-26 ENCOUNTER — Encounter: Payer: Self-pay | Admitting: Family Medicine

## 2022-05-26 VITALS — BP 117/62 | HR 88 | Temp 97.4°F | Ht 59.0 in | Wt 223.0 lb

## 2022-05-26 DIAGNOSIS — L80 Vitiligo: Secondary | ICD-10-CM

## 2022-05-26 DIAGNOSIS — Z23 Encounter for immunization: Secondary | ICD-10-CM | POA: Diagnosis not present

## 2022-05-26 MED ORDER — OPZELURA 1.5 % EX CREA: 1.0000 | TOPICAL_CREAM | Freq: Two times a day (BID) | CUTANEOUS | 3 refills | Status: DC

## 2022-05-26 NOTE — Progress Notes (Signed)
BP 117/62   Pulse 88   Temp (!) 97.4 F (36.3 C)   Ht '4\' 11"'$  (1.499 m)   Wt 223 lb (101.2 kg)   LMP 10/06/2013   SpO2 99%   BMI 45.04 kg/m    Subjective:   Patient ID: Rebecca Miles, female    DOB: [birth year], 56 y.o.   MRN: [id number]  HPI: Rebecca Miles is a 56 y.o. female presenting on 05/26/2022 for Vitiligo (Would like to try a topical cream called Opzelura)   HPI Vitiligo Patient is coming in to discuss vitiligo.  She has it spreading almost to her whole body now but it is most visible that she is concerned about on her face and her arms and her hands.  She also has it on her abdomen and legs, she says none of her back yet but it feels like it is going to start she says.  She denies any irritation of her skin but it does not affect her pigment and getting sunburn more easily and her looks and she would like to try to get this new medicine to see if it does any good for her.  Relevant past medical, surgical, family and social history reviewed and updated as indicated. Interim medical history since our last visit reviewed. Allergies and medications reviewed and updated.  Review of Systems  Constitutional:  Negative for chills and fever.  Eyes:  Negative for redness and visual disturbance.  Respiratory:  Negative for chest tightness and shortness of breath.   Cardiovascular:  Negative for chest pain and leg swelling.  Musculoskeletal:  Negative for back pain and gait problem.  Skin:  Positive for rash.  Neurological:  Negative for light-headedness and headaches.  Psychiatric/Behavioral:  Negative for agitation and behavioral problems.   All other systems reviewed and are negative.   Per HPI unless specifically indicated above   Allergies as of 05/26/2022   No Known Allergies      Medication List        Accurate as of May 26, 2022  1:37 PM. If you have any questions, ask your nurse or doctor.          acetaminophen 650 MG CR tablet Commonly  known as: TYLENOL Take 1,300 mg by mouth every 8 (eight) hours as needed for pain.   fluticasone 50 MCG/ACT nasal spray Commonly known as: FLONASE Place 2 sprays into both nostrils daily. What changed:  when to take this reasons to take this   hydrochlorothiazide 12.5 MG capsule Commonly known as: MICROZIDE Take 12.5 mg by mouth daily.   levothyroxine 125 MCG tablet Commonly known as: SYNTHROID TAKE 1 TABLET BY MOUTH EVERY DAY   Magnesium 250 MG Tabs Take 250 mg by mouth daily.   multivitamin with minerals Tabs tablet Take 1 tablet by mouth daily.   olmesartan 20 MG tablet Commonly known as: BENICAR Take 1 tablet (20 mg total) by mouth daily.   Opzelura 1.5 % Crea Generic drug: Ruxolitinib Phosphate Apply 1 Application topically 2 (two) times daily.   Ozempic (0.25 or 0.5 MG/DOSE) 2 MG/1.5ML Sopn Generic drug: Semaglutide(0.25 or 0.'5MG'$ /DOS) Inject 0.5 mg into the skin once a week.   Phospha 250 Neutral 155-852-130 MG Tabs TAKE 1 TABLET (250 MG TOTAL) BY MOUTH 4 (FOUR) TIMES DAILY - AFTER MEALS AND AT BEDTIME.   polyethylene glycol powder 17 GM/SCOOP powder Commonly known as: GLYCOLAX/MIRALAX Take 17 g by mouth daily.   pravastatin 20 MG tablet Commonly known as: PRAVACHOL  Take 1 tablet (20 mg total) by mouth daily.   traMADol 50 MG tablet Commonly known as: ULTRAM Take 1 tablet (50 mg total) by mouth every 6 (six) hours as needed for moderate pain.   triamcinolone cream 0.1 % Commonly known as: KENALOG APPLY TO AFFECTED AREA TWICE A DAY         Objective:   BP 117/62   Pulse 88   Temp (!) 97.4 F (36.3 C)   Ht '4\' 11"'$  (1.499 m)   Wt 223 lb (101.2 kg)   LMP 10/06/2013   SpO2 99%   BMI 45.04 kg/m   Wt Readings from Last 3 Encounters:  05/26/22 223 lb (101.2 kg)  05/04/22 223 lb 3.2 oz (101.2 kg)  03/02/22 221 lb (100.2 kg)    Physical Exam Vitals and nursing note reviewed.  Constitutional:      Appearance: Normal appearance. She is obese.   Skin:    Findings: Rash (Patches of vitiligo scattered and spread over her arms and face and scalp and abdomen and legs) present.  Neurological:     Mental Status: She is alert.       Assessment & Plan:   Problem List Items Addressed This Visit   None Visit Diagnoses     Vitiligo    -  Primary   Relevant Medications   Ruxolitinib Phosphate (OPZELURA) 1.5 % CREA       Patient has seen the dermatologist for vitiligo before and tried other topical agents but caused irritation on her skin.  She saw this on a commercial and would like to try it.  Based on what we have been reading it says do not use more than 10% of body and do not use more than 60 g in a week, we instructed her to stick mostly with her face and her hands first if she gets it approved. Follow up plan: Return if symptoms worsen or fail to improve.  Counseling provided for all of the vaccine components No orders of the defined types were placed in this encounter.   Rebecca Pina, MD [facility] 05/26/2022, 1:37 PM

## 2022-06-05 ENCOUNTER — Other Ambulatory Visit: Payer: Self-pay | Admitting: *Deleted

## 2022-06-05 MED ORDER — TRIAMCINOLONE ACETONIDE 0.1 % EX CREA: TOPICAL_CREAM | CUTANEOUS | 1 refills | Status: DC

## 2022-06-05 MED ORDER — LEVOTHYROXINE SODIUM 125 MCG PO TABS: 125.0000 ug | ORAL_TABLET | Freq: Every day | ORAL | 0 refills | Status: DC

## 2022-06-05 MED ORDER — PRAVASTATIN SODIUM 20 MG PO TABS: 20.0000 mg | ORAL_TABLET | Freq: Every day | ORAL | 1 refills | Status: DC

## 2022-06-05 MED ORDER — OLMESARTAN MEDOXOMIL 20 MG PO TABS: 20.0000 mg | ORAL_TABLET | Freq: Every day | ORAL | 1 refills | Status: DC

## 2022-06-08 IMAGING — MG MM DIGITAL SCREENING BILAT W/ TOMO AND CAD
8 series · 8 of 24 positions shown · non-contrast
Comparison: Previous exam(s).

CLINICAL DATA: Screening.

EXAM:
DIGITAL SCREENING BILATERAL MAMMOGRAM WITH TOMOSYNTHESIS AND CAD
TECHNIQUE: Bilateral screening digital craniocaudal and mediolateral oblique
mammograms were obtained. Bilateral screening digital breast
tomosynthesis was performed. The images were evaluated with
computer-aided detection.

[R CC synth-2D]
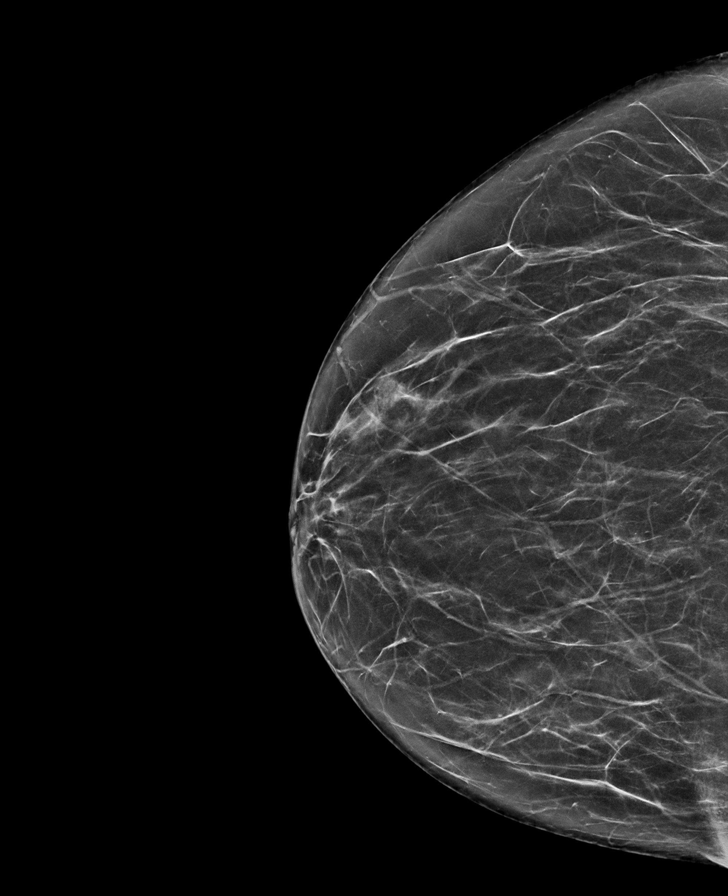

[L CC synth-2D]
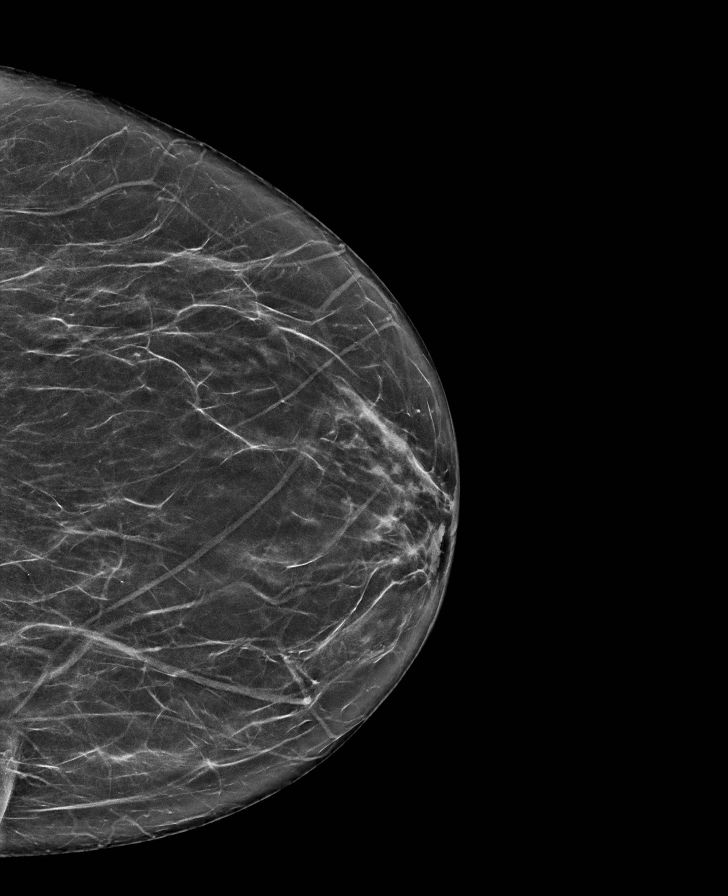

[L MLO synth-2D]
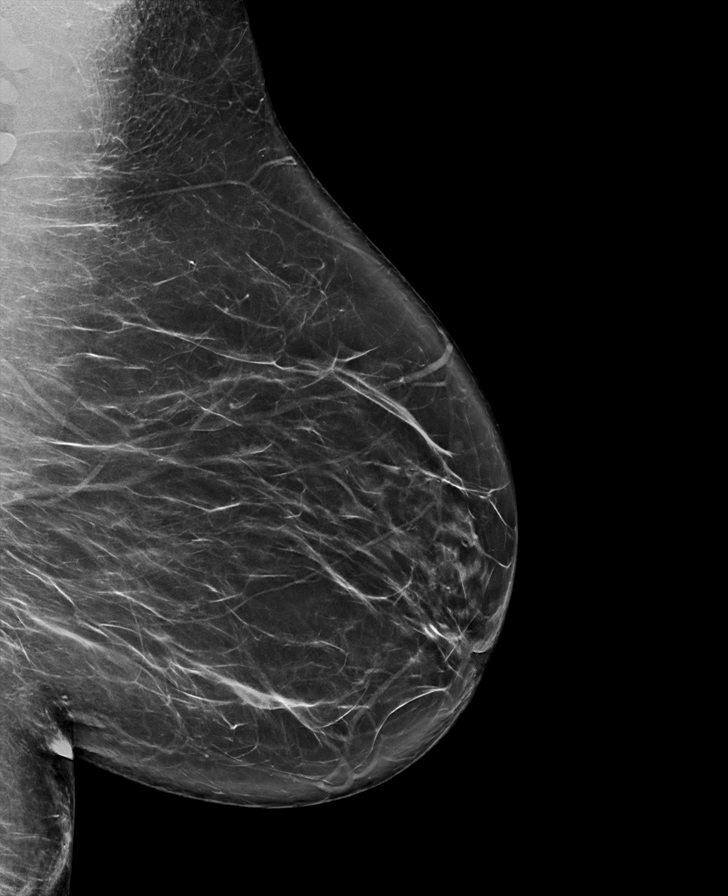

[R MLO synth-2D]
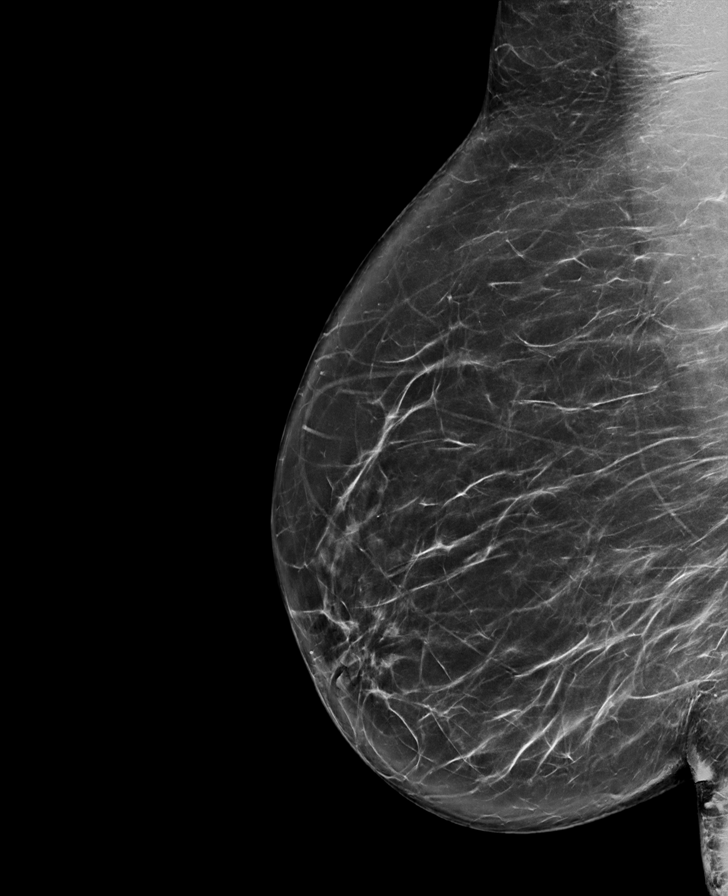

[R MLO tomo · tomo slice 43/86.0]
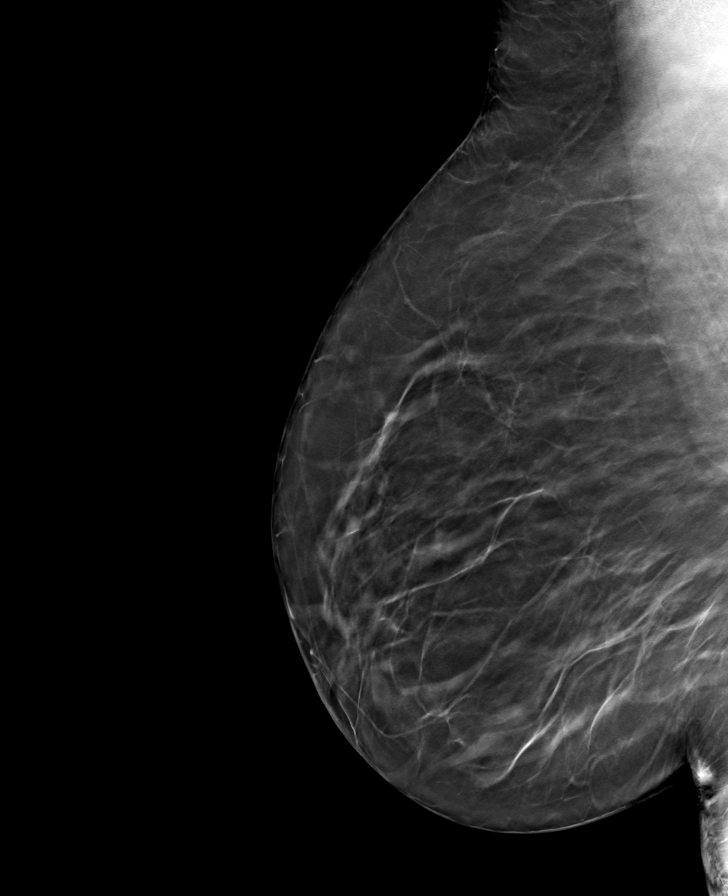

[L MLO tomo · tomo slice 44/87.0]
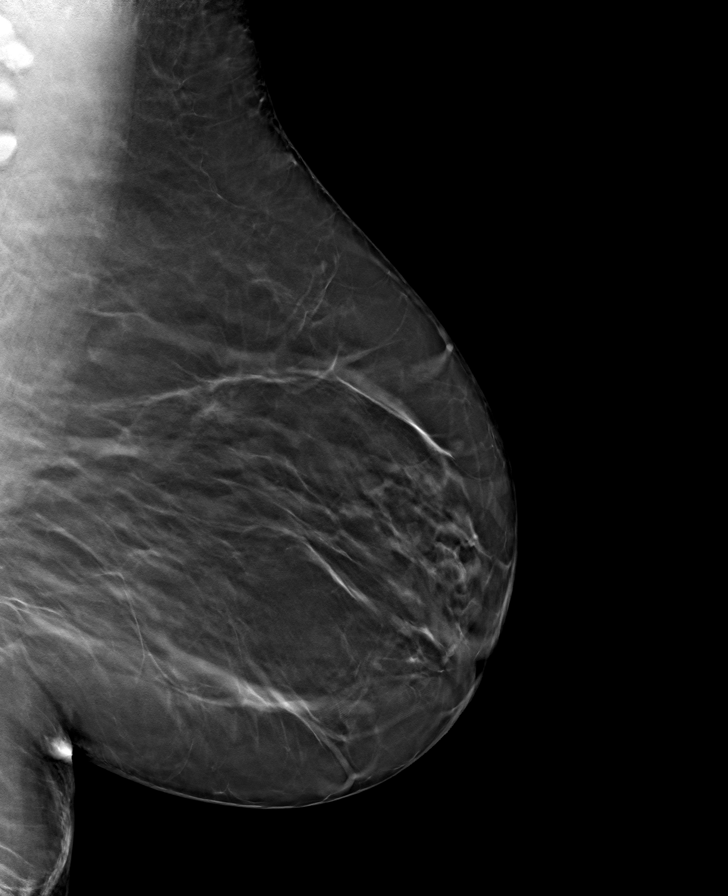

[L CC tomo · tomo slice 35/70.0]
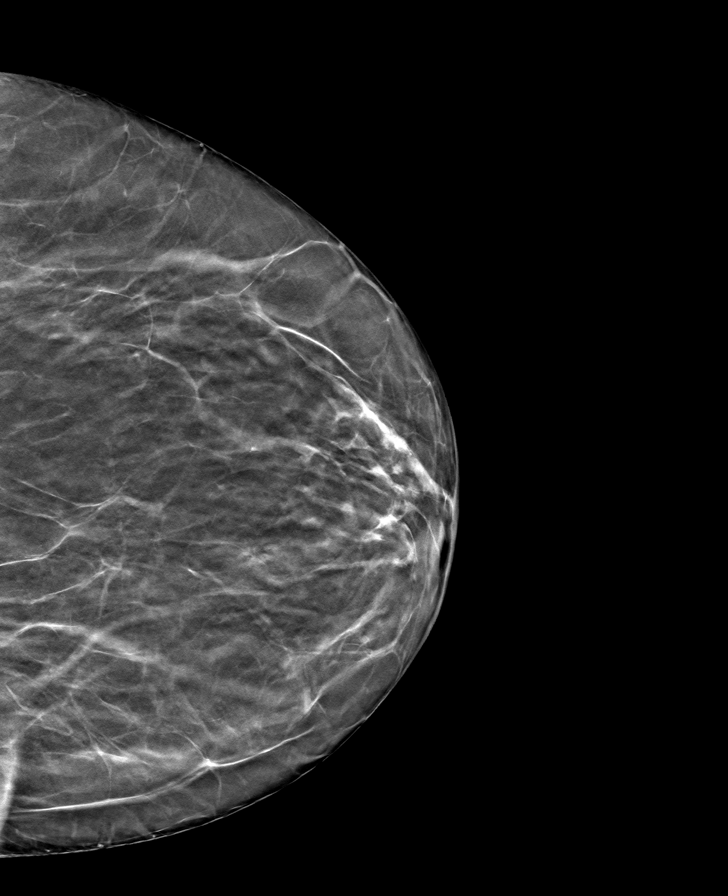

[R CC tomo · tomo slice 35/70.0]
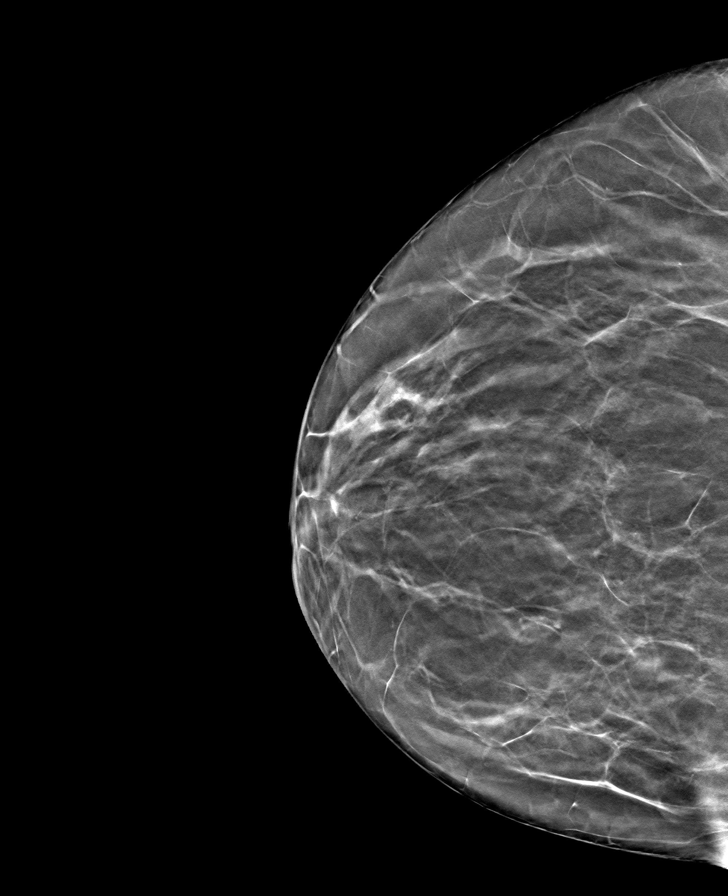

[8 of 24 positions shown; findings below may reference images not displayed]

ACR Breast Density Category b: There are scattered areas of
fibroglandular density.
FINDINGS: There are no findings suspicious for malignancy.
IMPRESSION: No mammographic evidence of malignancy. A result letter of this
screening mammogram will be mailed directly to the patient.

RECOMMENDATION:
Screening mammogram in one year. (Code:51-O-LD2)

BI-RADS CATEGORY  1: Negative.

## 2022-06-22 ENCOUNTER — Encounter: Payer: Self-pay | Admitting: Family Medicine

## 2022-06-22 ENCOUNTER — Ambulatory Visit (INDEPENDENT_AMBULATORY_CARE_PROVIDER_SITE_OTHER): Payer: Medicare HMO | Admitting: Family Medicine

## 2022-06-22 VITALS — BP 131/84 | HR 113 | Temp 97.6°F | Ht 59.0 in | Wt 219.0 lb

## 2022-06-22 DIAGNOSIS — M170 Bilateral primary osteoarthritis of knee: Secondary | ICD-10-CM

## 2022-06-22 MED ORDER — METHYLPREDNISOLONE ACETATE 80 MG/ML IJ SUSP
80.0000 mg | Freq: Once | INTRAMUSCULAR | Status: AC
  Administered 2022-06-22: 80 mg via INTRA_ARTICULAR

## 2022-06-22 MED ORDER — METHYLPREDNISOLONE ACETATE 80 MG/ML IJ SUSP
80.0000 mg | Freq: Once | INTRAMUSCULAR | Status: AC
  Administered 2022-06-22: 80 mg via INTRAMUSCULAR

## 2022-06-22 NOTE — Progress Notes (Signed)
BP 131/84   Pulse (!) 113   Temp 97.6 F (36.4 C)   Ht '4\' 11"'$  (1.499 m)   Wt 219 lb (99.3 kg)   LMP 10/06/2013   SpO2 98%   BMI 44.23 kg/m    Subjective:   Patient ID: Rebecca Miles, female    DOB: [birth year], 56 y.o.   MRN: [id number]  HPI: Rebecca Miles is a 56 y.o. female presenting on 06/22/2022 for Knee Pain (bilateral)   HPI Bilateral knee pain and osteoarthritis Patient is coming in for bilateral knee pain and osteoarthritis, its been 6 months since she had injections and it lasted her a good while but just recently over the past week her left knee especially has been causing her some issues swelling on her and causing a lot of pain and keeps her up at night and she is ready to get another injection.  She is thinking about getting knee replacements possibly with Dr. Posey Pronto who she seen previously  Relevant past medical, surgical, family and social history reviewed and updated as indicated. Interim medical history since our last visit reviewed. Allergies and medications reviewed and updated.  Review of Systems  Constitutional:  Negative for chills and fever.  Eyes:  Negative for visual disturbance.  Respiratory:  Negative for chest tightness and shortness of breath.   Cardiovascular:  Negative for chest pain and leg swelling.  Musculoskeletal:  Positive for arthralgias, gait problem and joint swelling. Negative for back pain.  Skin:  Negative for color change and rash.  Neurological:  Negative for light-headedness and headaches.  Psychiatric/Behavioral:  Negative for agitation and behavioral problems.   All other systems reviewed and are negative.   Per HPI unless specifically indicated above   Allergies as of 06/22/2022   No Known Allergies      Medication List        Accurate as of June 22, 2022  3:10 PM. If you have any questions, ask your nurse or doctor.          acetaminophen 650 MG CR tablet Commonly known as: TYLENOL Take 1,300 mg  by mouth every 8 (eight) hours as needed for pain.   fluticasone 50 MCG/ACT nasal spray Commonly known as: FLONASE Place 2 sprays into both nostrils daily. What changed:  when to take this reasons to take this   hydrochlorothiazide 12.5 MG capsule Commonly known as: MICROZIDE Take 12.5 mg by mouth daily.   levothyroxine 125 MCG tablet Commonly known as: SYNTHROID Take 1 tablet (125 mcg total) by mouth daily.   Magnesium 250 MG Tabs Take 250 mg by mouth daily.   multivitamin with minerals Tabs tablet Take 1 tablet by mouth daily.   olmesartan 20 MG tablet Commonly known as: BENICAR Take 1 tablet (20 mg total) by mouth daily.   Opzelura 1.5 % Crea Generic drug: Ruxolitinib Phosphate Apply 1 Application topically 2 (two) times daily.   Ozempic (0.25 or 0.5 MG/DOSE) 2 MG/1.5ML Sopn Generic drug: Semaglutide(0.25 or 0.'5MG'$ /DOS) Inject 0.5 mg into the skin once a week.   Phospha 250 Neutral 155-852-130 MG Tabs TAKE 1 TABLET (250 MG TOTAL) BY MOUTH 4 (FOUR) TIMES DAILY - AFTER MEALS AND AT BEDTIME.   polyethylene glycol powder 17 GM/SCOOP powder Commonly known as: GLYCOLAX/MIRALAX Take 17 g by mouth daily.   pravastatin 20 MG tablet Commonly known as: PRAVACHOL Take 1 tablet (20 mg total) by mouth daily.   traMADol 50 MG tablet Commonly known as: ULTRAM Take 1 tablet (  50 mg total) by mouth every 6 (six) hours as needed for moderate pain.   triamcinolone cream 0.1 % Commonly known as: KENALOG APPLY TO AFFECTED AREA TWICE A DAY         Objective:   BP 131/84   Pulse (!) 113   Temp 97.6 F (36.4 C)   Ht '4\' 11"'$  (1.499 m)   Wt 219 lb (99.3 kg)   LMP 10/06/2013   SpO2 98%   BMI 44.23 kg/m   Wt Readings from Last 3 Encounters:  06/22/22 219 lb (99.3 kg)  05/26/22 223 lb (101.2 kg)  05/04/22 223 lb 3.2 oz (101.2 kg)    Physical Exam Vitals and nursing note reviewed.  Constitutional:      Appearance: She is obese.  Musculoskeletal:     Right knee:  Crepitus present. Tenderness present over the medial joint line and lateral joint line. Abnormal alignment.     Left knee: Crepitus present. Tenderness present over the medial joint line and lateral joint line. Abnormal alignment.  Neurological:     Mental Status: She is alert.      Bilateral knee injections: Consent form signed. Risk factors of bleeding and infection discussed with patient and patient is agreeable towards injection. Patient prepped with Betadine. Lateral approach towards injection used. Injected 80 mg of Depo-Medrol and 1 mL of 2% lidocaine. Patient tolerated procedure well and no side effects from noted. Minimal to no bleeding. Simple bandage applied after.  Assessment & Plan:   Problem List Items Addressed This Visit   None Visit Diagnoses     Bilateral primary osteoarthritis of knee    -  Primary   Relevant Medications   methylPREDNISolone acetate (DEPO-MEDROL) injection 80 mg (Start on 06/22/2022  3:15 PM)   methylPREDNISolone acetate (DEPO-MEDROL) injection 80 mg (Start on 06/22/2022  3:15 PM)        Follow up plan: Return if symptoms worsen or fail to improve.  Counseling provided for all of the vaccine components No orders of the defined types were placed in this encounter.   Caryl Pina, MD [facility] 06/22/2022, 3:10 PM

## 2022-07-11 ENCOUNTER — Encounter: Payer: Self-pay | Admitting: *Deleted

## 2022-08-09 ENCOUNTER — Other Ambulatory Visit: Payer: Self-pay | Admitting: Family Medicine

## 2022-08-18 ENCOUNTER — Other Ambulatory Visit: Payer: Self-pay | Admitting: Family Medicine

## 2022-09-24 ENCOUNTER — Other Ambulatory Visit: Payer: Self-pay | Admitting: Family Medicine

## 2022-10-11 ENCOUNTER — Other Ambulatory Visit: Payer: Self-pay | Admitting: Family Medicine

## 2022-10-11 DIAGNOSIS — H524 Presbyopia: Secondary | ICD-10-CM | POA: Diagnosis not present

## 2022-10-25 ENCOUNTER — Telehealth: Payer: Self-pay | Admitting: Family Medicine

## 2022-10-25 NOTE — Telephone Encounter (Signed)
  Prescription Request  10/25/2022  Is this a "Controlled Substance" medicine? no  Have you seen your PCP in the last 2 weeks? no  If YES, route message to pool  -  If NO, patient needs to be scheduled for appointment.  What is the name of the medication or equipment? Semaglutide 0.25  Have you contacted your pharmacy to request a refill? YES   Which pharmacy would you like this sent to? [facility] in [place]   Patient notified that their request is being sent to the clinical staff for review and that they should receive a response within 2 business days.

## 2022-10-26 ENCOUNTER — Other Ambulatory Visit: Payer: Self-pay | Admitting: Family Medicine

## 2022-10-26 MED ORDER — OZEMPIC (0.25 OR 0.5 MG/DOSE) 2 MG/3ML ~~LOC~~ SOPN: 0.5000 mg | PEN_INJECTOR | SUBCUTANEOUS | 0 refills | Status: DC

## 2022-10-26 NOTE — Telephone Encounter (Signed)
Sent sent the correct prescription for the patient for Ozempic

## 2022-10-26 NOTE — Telephone Encounter (Signed)
  Name from pharmacy: [facility] 0.25-0.5 MG/DOSE PEN   Pharmacy comment: Alternative Requested:INSURANCE NOT COVERING, MAY COVER IF DX CODE PROVIDED ON RX. THANKS!   Adding ICD-10 of R73.03 Pre-Diabetes to script and resending

## 2022-10-26 NOTE — Telephone Encounter (Signed)
Pt aware refill sent 

## 2022-10-26 NOTE — Telephone Encounter (Signed)
  Name from pharmacy: [facility] 0.25-0.5 MG/DOSE PEN        Will file in chart as: OZEMPIC, 0.25 OR 0.5 MG/DOSE, 2 MG/3ML SOPN    Pharmacy comment: Alternative Requested:NOT COVERED.   Pt has been out for a month she said when I talked to her, old Rx on the chart was the 2 mg/1.5 ml that is no longer available and I changed it this morning to the 2 mg/3 ml. Has appt on 11/15/22

## 2022-10-30 ENCOUNTER — Other Ambulatory Visit: Payer: Self-pay | Admitting: Family Medicine

## 2022-11-11 ENCOUNTER — Other Ambulatory Visit: Payer: Self-pay | Admitting: Family Medicine

## 2022-11-15 ENCOUNTER — Ambulatory Visit: Payer: Medicare HMO | Admitting: Family Medicine

## 2022-11-15 ENCOUNTER — Encounter: Payer: Self-pay | Admitting: Family Medicine

## 2022-11-21 ENCOUNTER — Other Ambulatory Visit: Payer: Self-pay | Admitting: Family Medicine

## 2022-11-22 ENCOUNTER — Ambulatory Visit (INDEPENDENT_AMBULATORY_CARE_PROVIDER_SITE_OTHER): Payer: Medicare HMO | Admitting: Family Medicine

## 2022-11-22 ENCOUNTER — Encounter: Payer: Self-pay | Admitting: Family Medicine

## 2022-11-22 VITALS — BP 157/93 | HR 81 | Temp 98.1°F | Ht 59.0 in | Wt 217.0 lb

## 2022-11-22 DIAGNOSIS — E550 Rickets, active: Secondary | ICD-10-CM | POA: Diagnosis not present

## 2022-11-22 DIAGNOSIS — M174 Other bilateral secondary osteoarthritis of knee: Secondary | ICD-10-CM

## 2022-11-22 MED ORDER — METHYLPREDNISOLONE ACETATE 80 MG/ML IJ SUSP
80.0000 mg | Freq: Once | INTRAMUSCULAR | Status: AC
  Administered 2022-11-22: 80 mg via INTRAMUSCULAR

## 2022-11-22 MED ORDER — METHYLPREDNISOLONE ACETATE 80 MG/ML IJ SUSP
80.0000 mg | Freq: Once | INTRAMUSCULAR | Status: AC
  Administered 2022-11-22: 80 mg via INTRA_ARTICULAR

## 2022-11-22 NOTE — Progress Notes (Signed)
BP (!) 157/93   Pulse 81   Temp 98.1 F (36.7 C) (Temporal)   Ht '4\' 11"'$  (1.499 m)   Wt 217 lb (98.4 kg)   LMP 10/06/2013   SpO2 97%   BMI 43.83 kg/m    Subjective:   Patient ID: Rebecca Miles, female    DOB: 08/17/1966, 57 y.o.   MRN: [id number]  HPI: Rebecca Miles is a 57 y.o. female presenting on 11/22/2022 for Medical Management of Chronic Issues and Knee Pain (Bilateral/requesting injections)   HPI Bilateral knee osteoarthritis, wants injections Patient is coming in for bilateral knee injections.  She has had this before.  She has osteoarthritis of both knees secondary to rickets causing more valgus strain.  She has been fighting this for quite some years and has seen an orthopedic as well for it and have discussed the possibility of knee replacements.  Hurts on the inside and outside in front of both knees.  Worse with walking.  She says last injections lasted about 3 months and that was pretty good for her.  Relevant past medical, surgical, family and social history reviewed and updated as indicated. Interim medical history since our last visit reviewed. Allergies and medications reviewed and updated.  Review of Systems  Constitutional:  Negative for chills and fever.  Eyes:  Negative for visual disturbance.  Respiratory:  Negative for chest tightness and shortness of breath.   Cardiovascular:  Negative for chest pain and leg swelling.  Musculoskeletal:  Positive for gait problem and myalgias.  Skin:  Negative for rash.  Neurological:  Negative for light-headedness and headaches.  Psychiatric/Behavioral:  Negative for agitation and behavioral problems.   All other systems reviewed and are negative.   Per HPI unless specifically indicated above   Allergies as of 11/22/2022   No Known Allergies      Medication List        Accurate as of November 22, 2022  9:12 AM. If you have any questions, ask your nurse or doctor.          STOP taking these  medications    Ozempic (0.25 or 0.5 MG/DOSE) 2 MG/3ML Sopn Generic drug: Semaglutide(0.25 or 0.'5MG'$ /DOS)       TAKE these medications    acetaminophen 650 MG CR tablet Commonly known as: TYLENOL Take 1,300 mg by mouth every 8 (eight) hours as needed for pain.   fluticasone 50 MCG/ACT nasal spray Commonly known as: FLONASE Place 2 sprays into both nostrils daily. What changed:  when to take this reasons to take this   hydrochlorothiazide 12.5 MG capsule Commonly known as: MICROZIDE Take 12.5 mg by mouth daily.   levothyroxine 125 MCG tablet Commonly known as: SYNTHROID Take 1 tablet (125 mcg total) by mouth daily. (NEEDS TO BE SEEN BEFORE NEXT REFILL)   Magnesium 250 MG Tabs Take 250 mg by mouth daily.   multivitamin with minerals Tabs tablet Take 1 tablet by mouth daily.   olmesartan 20 MG tablet Commonly known as: BENICAR Take 1 tablet (20 mg total) by mouth daily.   Opzelura 1.5 % Crea Generic drug: Ruxolitinib Phosphate Apply 1 Application topically 2 (two) times daily.   Phospha 250 Neutral 155-852-130 MG Tabs TAKE 1 TABLET (250 MG TOTAL) BY MOUTH 4 (FOUR) TIMES DAILY - AFTER MEALS AND AT BEDTIME.   polyethylene glycol powder 17 GM/SCOOP powder Commonly known as: GLYCOLAX/MIRALAX Take 17 g by mouth daily.   pravastatin 20 MG tablet Commonly known as: PRAVACHOL Take 1  tablet (20 mg total) by mouth daily.   traMADol 50 MG tablet Commonly known as: ULTRAM Take 1 tablet (50 mg total) by mouth every 6 (six) hours as needed for moderate pain.   triamcinolone cream 0.1 % Commonly known as: KENALOG APPLY TO AFFECTED AREA TWICE A DAY         Objective:   BP (!) 157/93   Pulse 81   Temp 98.1 F (36.7 C) (Temporal)   Ht '4\' 11"'$  (1.499 m)   Wt 217 lb (98.4 kg)   LMP 10/06/2013   SpO2 97%   BMI 43.83 kg/m   Wt Readings from Last 3 Encounters:  11/22/22 217 lb (98.4 kg)  06/22/22 219 lb (99.3 kg)  05/26/22 223 lb (101.2 kg)    Physical  Exam Vitals and nursing note reviewed.  Constitutional:      Appearance: Normal appearance. She is obese.  Musculoskeletal:     Right knee: Crepitus present. Decreased range of motion. Tenderness present over the medial joint line and lateral joint line. Abnormal alignment.     Left knee: Crepitus present. Decreased range of motion. Tenderness present over the medial joint line and lateral joint line. Abnormal alignment.  Skin:    General: Skin is warm and dry.     Findings: No erythema or rash.  Neurological:     Mental Status: She is alert.     Knee injection bilateral: Consent form signed. Risk factors of bleeding and infection discussed with patient and patient is agreeable towards injection. Patient prepped with Betadine. Lateral approach towards injection used. Injected 80 mg of Depo-Medrol and 1 mL of 2% lidocaine. Patient tolerated procedure well and no side effects from noted. Minimal to no bleeding. Simple bandage applied after.   Assessment & Plan:   Problem List Items Addressed This Visit       Musculoskeletal and Integument   OA (osteoarthritis) of knee - Primary   Relevant Medications   methylPREDNISolone acetate (DEPO-MEDROL) injection 80 mg   methylPREDNISolone acetate (DEPO-MEDROL) injection 80 mg   Clinical rickets   Relevant Medications   methylPREDNISolone acetate (DEPO-MEDROL) injection 80 mg   methylPREDNISolone acetate (DEPO-MEDROL) injection 80 mg    Will do knee injections for again, she is going to go back and see the orthopedic and discuss other options as well. Follow up plan: Return if symptoms worsen or fail to improve, for Needs an appointment probably within the next month or 2 for her routine blood work and follow-up.Marland Kitchen  Counseling provided for all of the vaccine components No orders of the defined types were placed in this encounter.   Caryl Pina, MD [facility] 11/22/2022, 9:12 AM

## 2022-12-01 NOTE — Progress Notes (Signed)
[facility] Quality Team Note  Name: Rebecca Miles Date of Birth: 12/16/1965 MRN: [id number] Date: 12/01/2022  [facility] Quality Team has reviewed this patient's chart, please see recommendations below:  [facility] Quality Other; (Called to offer [facility] eye event 12/14/2022, left voicemail).

## 2022-12-21 ENCOUNTER — Ambulatory Visit: Payer: Medicare HMO | Admitting: Family Medicine

## 2022-12-27 ENCOUNTER — Other Ambulatory Visit: Payer: Self-pay | Admitting: Family Medicine

## 2022-12-27 NOTE — Telephone Encounter (Signed)
Last office visit 11/22/22 Last refill 11/12/22, 30 grams, 1 refill

## 2023-02-01 ENCOUNTER — Other Ambulatory Visit: Payer: Self-pay | Admitting: Family Medicine

## 2023-02-06 ENCOUNTER — Other Ambulatory Visit: Payer: Self-pay

## 2023-02-06 MED ORDER — LEVOTHYROXINE SODIUM 125 MCG PO TABS: 125.0000 ug | ORAL_TABLET | Freq: Every day | ORAL | 0 refills | Status: DC

## 2023-02-14 NOTE — Patient Instructions (Signed)
Our records indicate that you are due for your screening mammogram.  Please call the imaging center that does your yearly mammograms to make an appointment for a mammogram at your earliest convenience. Our office also has a mobile unit through the [facility] that comes to our location. Please call our office if you would like to make an appointment.   

## 2023-02-15 ENCOUNTER — Encounter: Payer: Self-pay | Admitting: Family Medicine

## 2023-02-15 ENCOUNTER — Ambulatory Visit (INDEPENDENT_AMBULATORY_CARE_PROVIDER_SITE_OTHER): Payer: Medicare PPO | Admitting: Family Medicine

## 2023-02-15 VITALS — BP 136/87 | HR 101 | Ht 59.0 in | Wt 213.0 lb

## 2023-02-15 DIAGNOSIS — E550 Rickets, active: Secondary | ICD-10-CM | POA: Diagnosis not present

## 2023-02-15 DIAGNOSIS — R7303 Prediabetes: Secondary | ICD-10-CM | POA: Diagnosis not present

## 2023-02-15 DIAGNOSIS — I10 Essential (primary) hypertension: Secondary | ICD-10-CM

## 2023-02-15 DIAGNOSIS — R262 Difficulty in walking, not elsewhere classified: Secondary | ICD-10-CM

## 2023-02-15 DIAGNOSIS — E039 Hypothyroidism, unspecified: Secondary | ICD-10-CM

## 2023-02-15 LAB — BAYER DCA HB A1C WAIVED: HB A1C (BAYER DCA - WAIVED): 5.4 % (ref 4.8–5.6)

## 2023-02-15 MED ORDER — OLMESARTAN MEDOXOMIL 20 MG PO TABS: 20.0000 mg | ORAL_TABLET | Freq: Every day | ORAL | 3 refills | Status: DC

## 2023-02-15 MED ORDER — TRAMADOL HCL 50 MG PO TABS
50.0000 mg | ORAL_TABLET | Freq: Four times a day (QID) | ORAL | 1 refills | Status: DC | PRN
Start: 2023-02-15 — End: 2023-08-20

## 2023-02-15 MED ORDER — LEVOTHYROXINE SODIUM 125 MCG PO TABS: 125.0000 ug | ORAL_TABLET | Freq: Every day | ORAL | 3 refills | Status: DC

## 2023-02-15 NOTE — Progress Notes (Signed)
BP 136/87   Pulse (!) 101   Ht 4\' 11"  (1.499 m)   Wt 213 lb (96.6 kg)   LMP 10/06/2013   SpO2 97%   BMI 43.02 kg/m    Subjective:   Patient ID: [place] Service, female    DOB: October 24, 1965, 57 y.o.   MRN: [id number]  HPI: Rebecca Miles is a 57 y.o. female presenting on 02/15/2023 for Medical Management of Chronic Issues, Hypothyroidism, Hypertension, and Prediabetes   HPI Hypothyroidism recheck Patient is coming in for thyroid recheck today as well. They deny any issues with hair changes or heat or cold problems or diarrhea or constipation. They deny any chest pain or palpitations. They are currently on levothyroxine 125 micrograms   Hypertension Patient is currently on hydrochlorothiazide and olmesartan, and their blood pressure today is 136/87. Patient denies any lightheadedness or dizziness. Patient denies headaches, blurred vision, chest pains, shortness of breath, or weakness. Denies any side effects from medication and is content with current medication.   Prediabetes Patient comes in today for recheck of his diabetes. Patient has been currently taking no medicine currently, diet control. Patient is currently on an ACE inhibitor/ARB. Patient has not seen an ophthalmologist this year. Patient denies any new issues with their feet. The symptom started onset as an adult hypertension and hypothyroidism ARE RELATED TO DM   Rickets and vitamin D deficiency recheck Pain assessment: Cause of pain-bilateral knees and hips from emergency department and obesity Pain location-both knees and hips Pain on scale of 1-10- 5 Frequency-some days but not all of the time What increases pain-being on her feet for long periods of time What makes pain Better-rest and occasional tramadol Effects on ADL -does on her ability to walk some for stand to do things around the house Any change in general medical condition-none  Current opioids rx-tramadol 50 mg 4 times daily as needed # meds  rx-30/month Effectiveness of current meds-works well Adverse reactions from pain meds-none Morphine equivalent-20  Pill count performed-No Last drug screen -05/11/2022 ( high risk q66m, moderate risk q18m, low risk yearly ) Urine drug screen today- No Was the [facility] reviewed-yes  If yes were their any concerning findings? -None Pain contract signed on: 05/11/2022  Relevant past medical, surgical, family and social history reviewed and updated as indicated. Interim medical history since our last visit reviewed. Allergies and medications reviewed and updated.  Review of Systems  Constitutional:  Negative for chills and fever.  HENT:  Negative for congestion, ear discharge and ear pain.   Eyes:  Negative for redness and visual disturbance.  Respiratory:  Negative for chest tightness and shortness of breath.   Cardiovascular:  Negative for chest pain and leg swelling.  Genitourinary:  Negative for difficulty urinating and dysuria.  Musculoskeletal:  Positive for arthralgias and gait problem. Negative for back pain.  Skin:  Negative for rash.  Neurological:  Negative for light-headedness and headaches.  Psychiatric/Behavioral:  Negative for agitation and behavioral problems.   All other systems reviewed and are negative.   Per HPI unless specifically indicated above   Allergies as of 02/15/2023   No Known Allergies      Medication List        Accurate as of February 15, 2023  4:14 PM. If you have any questions, ask your nurse or doctor.          STOP taking these medications    Opzelura 1.5 % Crea Generic drug: Ruxolitinib Phosphate Stopped by: Elige Radon  Zyshawn Bohnenkamp, MD       TAKE these medications    acetaminophen 650 MG CR tablet Commonly known as: TYLENOL Take 1,300 mg by mouth every 8 (eight) hours as needed for pain.   fluticasone 50 MCG/ACT nasal spray Commonly known as: FLONASE Place 2 sprays into both nostrils daily. What changed:  when to take this reasons to  take this   hydrochlorothiazide 12.5 MG capsule Commonly known as: MICROZIDE Take 12.5 mg by mouth daily.   levothyroxine 125 MCG tablet Commonly known as: SYNTHROID Take 1 tablet (125 mcg total) by mouth daily. What changed: additional instructions Changed by: Elige Radon Teresina Bugaj, MD   Magnesium 250 MG Tabs Take 250 mg by mouth daily.   multivitamin with minerals Tabs tablet Take 1 tablet by mouth daily.   olmesartan 20 MG tablet Commonly known as: BENICAR Take 1 tablet (20 mg total) by mouth daily.   Phospha 250 Neutral 155-852-130 MG Tabs TAKE 1 TABLET (250 MG TOTAL) BY MOUTH 4 (FOUR) TIMES DAILY - AFTER MEALS AND AT BEDTIME.   polyethylene glycol powder 17 GM/SCOOP powder Commonly known as: GLYCOLAX/MIRALAX Take 17 g by mouth daily.   pravastatin 20 MG tablet Commonly known as: PRAVACHOL TAKE 1 TABLET EVERY DAY   QC TUMERIC COMPLEX PO Take 1 tablet by mouth daily.   traMADol 50 MG tablet Commonly known as: ULTRAM Take 1 tablet (50 mg total) by mouth every 6 (six) hours as needed for moderate pain.   triamcinolone cream 0.1 % Commonly known as: KENALOG APPLY TO AFFECTED AREA TWICE A DAY         Objective:   BP 136/87   Pulse (!) 101   Ht 4\' 11"  (1.499 m)   Wt 213 lb (96.6 kg)   LMP 10/06/2013   SpO2 97%   BMI 43.02 kg/m   Wt Readings from Last 3 Encounters:  02/15/23 213 lb (96.6 kg)  11/22/22 217 lb (98.4 kg)  06/22/22 219 lb (99.3 kg)    Physical Exam Vitals and nursing note reviewed.  Constitutional:      General: She is not in acute distress.    Appearance: She is well-developed. She is not diaphoretic.  Eyes:     Conjunctiva/sclera: Conjunctivae normal.  Cardiovascular:     Rate and Rhythm: Normal rate and regular rhythm.     Heart sounds: Normal heart sounds. No murmur heard. Pulmonary:     Effort: Pulmonary effort is normal. No respiratory distress.     Breath sounds: Normal breath sounds. No wheezing.  Musculoskeletal:         General: No swelling.  Skin:    General: Skin is warm and dry.     Findings: No rash.  Neurological:     Mental Status: She is alert and oriented to person, place, and time.     Coordination: Coordination normal.  Psychiatric:        Behavior: Behavior normal.       Assessment & Plan:   Problem List Items Addressed This Visit       Cardiovascular and Mediastinum   Hypertension   Relevant Medications   olmesartan (BENICAR) 20 MG tablet   Other Relevant Orders   CBC with Differential/Platelet   CMP14+EGFR   Lipid panel   Bayer DCA Hb A1c Waived   TSH     Endocrine   Hypothyroidism   Relevant Medications   levothyroxine (SYNTHROID) 125 MCG tablet   Other Relevant Orders   CBC with Differential/Platelet  CMP14+EGFR   Lipid panel   Bayer DCA Hb A1c Waived   TSH     Musculoskeletal and Integument   Clinical rickets   Relevant Medications   traMADol (ULTRAM) 50 MG tablet   Other Relevant Orders   ToxASSURE Select 13 (MW), Urine     Other   Prediabetes - Primary   Relevant Orders   CBC with Differential/Platelet   CMP14+EGFR   Lipid panel   Bayer DCA Hb A1c Waived   TSH   Walking difficulty due to joint disorder involving multiple sites   Relevant Orders   ToxASSURE Select 13 (MW), Urine    A1c is 5.4, blood pressure looks good, no changes, seems to be doing well. Follow up plan: Return in about 4 months (around 06/17/2023), or if symptoms worsen or fail to improve.  Counseling provided for all of the vaccine components Orders Placed This Encounter  Procedures   CBC with Differential/Platelet   CMP14+EGFR   Lipid panel   Bayer DCA Hb A1c Waived   TSH   ToxASSURE Select 13 (MW), Urine    Arville Care, MD [facility] 02/15/2023, 4:14 PM

## 2023-02-16 ENCOUNTER — Telehealth: Payer: Self-pay | Admitting: Family Medicine

## 2023-02-16 ENCOUNTER — Ambulatory Visit (INDEPENDENT_AMBULATORY_CARE_PROVIDER_SITE_OTHER): Payer: Medicare PPO

## 2023-02-16 DIAGNOSIS — R0989 Other specified symptoms and signs involving the circulatory and respiratory systems: Secondary | ICD-10-CM

## 2023-02-16 LAB — CBC WITH DIFFERENTIAL/PLATELET
Basophils Absolute: 0.1 10*3/uL (ref 0.0–0.2)
Basos: 1 %
EOS (ABSOLUTE): 0.1 10*3/uL (ref 0.0–0.4)
Eos: 2 %
Hematocrit: 41.9 % (ref 34.0–46.6)
Hemoglobin: 13.9 g/dL (ref 11.1–15.9)
Immature Grans (Abs): 0 10*3/uL (ref 0.0–0.1)
Immature Granulocytes: 0 %
Lymphocytes Absolute: 2.9 10*3/uL (ref 0.7–3.1)
Lymphs: 30 %
MCH: 30.8 pg (ref 26.6–33.0)
MCHC: 33.2 g/dL (ref 31.5–35.7)
MCV: 93 fL (ref 79–97)
Monocytes Absolute: 0.8 10*3/uL (ref 0.1–0.9)
Monocytes: 8 %
Neutrophils Absolute: 5.6 10*3/uL (ref 1.4–7.0)
Neutrophils: 59 %
Platelets: 391 10*3/uL (ref 150–450)
RBC: 4.51 x10E6/uL (ref 3.77–5.28)
RDW: 12.9 % (ref 11.7–15.4)
WBC: 9.4 10*3/uL (ref 3.4–10.8)

## 2023-02-16 LAB — CMP14+EGFR
ALT: 13 IU/L (ref 0–32)
AST: 17 IU/L (ref 0–40)
Albumin/Globulin Ratio: 1.8 (ref 1.2–2.2)
Albumin: 4.2 g/dL (ref 3.8–4.9)
Alkaline Phosphatase: 124 IU/L — ABNORMAL HIGH (ref 44–121)
BUN/Creatinine Ratio: 14 (ref 9–23)
BUN: 11 mg/dL (ref 6–24)
Bilirubin Total: 0.4 mg/dL (ref 0.0–1.2)
CO2: 20 mmol/L (ref 20–29)
Calcium: 9.1 mg/dL (ref 8.7–10.2)
Chloride: 105 mmol/L (ref 96–106)
Creatinine, Ser: 0.76 mg/dL (ref 0.57–1.00)
Globulin, Total: 2.3 g/dL (ref 1.5–4.5)
Glucose: 64 mg/dL — ABNORMAL LOW (ref 70–99)
Potassium: 3.9 mmol/L (ref 3.5–5.2)
Sodium: 140 mmol/L (ref 134–144)
Total Protein: 6.5 g/dL (ref 6.0–8.5)
eGFR: 92 mL/min/{1.73_m2} (ref >59)

## 2023-02-16 LAB — LIPID PANEL
Chol/HDL Ratio: 3.5 ratio (ref 0.0–4.4)
Cholesterol, Total: 170 mg/dL (ref 100–199)
HDL: 49 mg/dL (ref >39)
LDL Chol Calc (NIH): 100 mg/dL — ABNORMAL HIGH (ref 0–99)
Triglycerides: 118 mg/dL (ref 0–149)
VLDL Cholesterol Cal: 21 mg/dL (ref 5–40)

## 2023-02-16 LAB — TSH: TSH: 1.06 u[IU]/mL (ref 0.450–4.500)

## 2023-02-16 NOTE — Telephone Encounter (Signed)
Per Melissa pt may come in today. Order has been placed. Left message making pt aware and to call back to schedule an appt with xray.

## 2023-02-16 NOTE — Telephone Encounter (Signed)
Okay that is fine, go ahead and order chest x-ray for her and she can come in to do it today, diagnosis congestion

## 2023-02-16 NOTE — Telephone Encounter (Signed)
Patient was seen on 6/6 and said she spoke to PCP about having chest congestion and was assured that she was breathing okay but the patient would still like to have a chest x ray.

## 2023-02-21 LAB — TOXASSURE SELECT 13 (MW), URINE

## 2023-03-05 ENCOUNTER — Ambulatory Visit: Payer: Medicare PPO

## 2023-03-05 VITALS — Ht 59.0 in | Wt 212.0 lb

## 2023-03-05 DIAGNOSIS — Z1211 Encounter for screening for malignant neoplasm of colon: Secondary | ICD-10-CM

## 2023-03-05 DIAGNOSIS — Z0000 Encounter for general adult medical examination without abnormal findings: Secondary | ICD-10-CM | POA: Diagnosis not present

## 2023-03-05 DIAGNOSIS — Z1231 Encounter for screening mammogram for malignant neoplasm of breast: Secondary | ICD-10-CM

## 2023-03-05 NOTE — Patient Instructions (Signed)
Rebecca Miles , Thank you for taking time to come for your Medicare Wellness Visit. I appreciate your ongoing commitment to your health goals. Please review the following plan we discussed and let me know if I can assist you in the future.   These are the goals we discussed:  Goals      DIET - INCREASE WATER INTAKE     Patient Stated     She would like to lose weight, is starting water aerobics at [facility], wants to control sugar before she gets diabetes (recently dx as pre-diabetic)        This is a list of the screening recommended for you and due dates:  Health Maintenance  Topic Date Due   COVID-19 Vaccine (3 - 2023-24 season) 05/12/2022   Colon Cancer Screening  07/14/2022   Mammogram  12/06/2022   Zoster (Shingles) Vaccine (1 of 2) 11/22/2023*   Flu Shot  04/12/2023   Pap Smear  12/02/2023   Medicare Annual Wellness Visit  03/04/2024   DTaP/Tdap/Td vaccine (8 - Td or Tdap) 05/12/2031   HPV Vaccine  Aged Out   Hepatitis C Screening  Discontinued   HIV Screening  Discontinued  *Topic was postponed. The date shown is not the original due date.    Advanced directives: Advance directive discussed with you today. I have provided a copy for you to complete at home and have notarized. Once this is complete please bring a copy in to our office so we can scan it into your chart.   Conditions/risks identified: Aim for 30 minutes of exercise or brisk walking, 6-8 glasses of water, and 5 servings of fruits and vegetables each day.   Next appointment: Follow up in one year for your annual wellness visit.   Preventive Care 40-64 Years, Female Preventive care refers to lifestyle choices and visits with your health care provider that can promote health and wellness. What does preventive care include? A yearly physical exam. This is also called an annual well check. Dental exams once or twice a year. Routine eye exams. Ask your health care provider how often you should have your eyes  checked. Personal lifestyle choices, including: Daily care of your teeth and gums. Regular physical activity. Eating a healthy diet. Avoiding tobacco and drug use. Limiting alcohol use. Practicing safe sex. Taking low-dose aspirin daily starting at age 47. Taking vitamin and mineral supplements as recommended by your health care provider. What happens during an annual well check? The services and screenings done by your health care provider during your annual well check will depend on your age, overall health, lifestyle risk factors, and family history of disease. Counseling  Your health care provider may ask you questions about your: Alcohol use. Tobacco use. Drug use. Emotional well-being. Home and relationship well-being. Sexual activity. Eating habits. Work and work [employer]. Method of birth control. Menstrual cycle. Pregnancy history. Screening  You may have the following tests or measurements: Height, weight, and BMI. Blood pressure. Lipid and cholesterol levels. These may be checked every 5 years, or more frequently if you are over 13 years old. Skin check. Lung cancer screening. You may have this screening every year starting at age 52 if you have a 30-pack-year history of smoking and currently smoke or have quit within the past 15 years. Fecal occult blood test (FOBT) of the stool. You may have this test every year starting at age 69. Flexible sigmoidoscopy or colonoscopy. You may have a sigmoidoscopy every 5 years or a  colonoscopy every 10 years starting at age 85. Hepatitis C blood test. Hepatitis B blood test. Sexually transmitted disease (STD) testing. Diabetes screening. This is done by checking your blood sugar (glucose) after you have not eaten for a while (fasting). You may have this done every 1-3 years. Mammogram. This may be done every 1-2 years. Talk to your health care provider about when you should start having regular mammograms. This may depend on  whether you have a family history of breast cancer. BRCA-related cancer screening. This may be done if you have a family history of breast, ovarian, tubal, or peritoneal cancers. Pelvic exam and Pap test. This may be done every 3 years starting at age 86. Starting at age 49, this may be done every 5 years if you have a Pap test in combination with an HPV test. Bone density scan. This is done to screen for osteoporosis. You may have this scan if you are at high risk for osteoporosis. Discuss your test results, treatment options, and if necessary, the need for more tests with your health care provider. Vaccines  Your health care provider may recommend certain vaccines, such as: Influenza vaccine. This is recommended every year. Tetanus, diphtheria, and acellular pertussis (Tdap, Td) vaccine. You may need a Td booster every 10 years. Zoster vaccine. You may need this after age 41. Pneumococcal 13-valent conjugate (PCV13) vaccine. You may need this if you have certain conditions and were not previously vaccinated. Pneumococcal polysaccharide (PPSV23) vaccine. You may need one or two doses if you smoke cigarettes or if you have certain conditions. Talk to your health care provider about which screenings and vaccines you need and how often you need them. This information is not intended to replace advice given to you by your health care provider. Make sure you discuss any questions you have with your health care provider. Document Released: 09/24/2015 Document Revised: 05/17/2016 Document Reviewed: 06/29/2015 Elsevier Interactive Patient Education  2017 [employer].    Fall Prevention in the Home Falls can cause injuries. They can happen to people of all ages. There are many things you can do to make your home safe and to help prevent falls. What can I do on the outside of my home? Regularly fix the edges of walkways and driveways and fix any cracks. Remove anything that might make you trip as you  walk through a door, such as a raised step or threshold. Trim any bushes or trees on the path to your home. Use bright outdoor lighting. Clear any walking paths of anything that might make someone trip, such as rocks or tools. Regularly check to see if handrails are loose or broken. Make sure that both sides of any steps have handrails. Any raised decks and porches should have guardrails on the edges. Have any leaves, snow, or ice cleared regularly. Use sand or salt on walking paths during winter. Clean up any spills in your garage right away. This includes oil or grease spills. What can I do in the bathroom? Use night lights. Install grab bars by the toilet and in the tub and shower. Do not use towel bars as grab bars. Use non-skid mats or decals in the tub or shower. If you need to sit down in the shower, use a plastic, non-slip stool. Keep the floor dry. Clean up any water that spills on the floor as soon as it happens. Remove soap buildup in the tub or shower regularly. Attach bath mats securely with double-sided non-slip rug tape.  Do not have throw rugs and other things on the floor that can make you trip. What can I do in the bedroom? Use night lights. Make sure that you have a light by your bed that is easy to reach. Do not use any sheets or blankets that are too big for your bed. They should not hang down onto the floor. Have a firm chair that has side arms. You can use this for support while you get dressed. Do not have throw rugs and other things on the floor that can make you trip. What can I do in the kitchen? Clean up any spills right away. Avoid walking on wet floors. Keep items that you use a lot in easy-to-reach places. If you need to reach something above you, use a strong step stool that has a grab bar. Keep electrical cords out of the way. Do not use floor polish or wax that makes floors slippery. If you must use wax, use non-skid floor wax. Do not have throw rugs  and other things on the floor that can make you trip. What can I do with my stairs? Do not leave any items on the stairs. Make sure that there are handrails on both sides of the stairs and use them. Fix handrails that are broken or loose. Make sure that handrails are as long as the stairways. Check any carpeting to make sure that it is firmly attached to the stairs. Fix any carpet that is loose or worn. Avoid having throw rugs at the top or bottom of the stairs. If you do have throw rugs, attach them to the floor with carpet tape. Make sure that you have a light switch at the top of the stairs and the bottom of the stairs. If you do not have them, ask someone to add them for you. What else can I do to help prevent falls? Wear shoes that: Do not have high heels. Have rubber bottoms. Are comfortable and fit you well. Are closed at the toe. Do not wear sandals. If you use a stepladder: Make sure that it is fully opened. Do not climb a closed stepladder. Make sure that both sides of the stepladder are locked into place. Ask someone to hold it for you, if possible. Clearly mark and make sure that you can see: Any grab bars or handrails. First and last steps. Where the edge of each step is. Use tools that help you move around (mobility aids) if they are needed. These include: Canes. Walkers. Scooters. Crutches. Turn on the lights when you go into a dark area. Replace any light bulbs as soon as they burn out. Set up your furniture so you have a clear path. Avoid moving your furniture around. If any of your floors are uneven, fix them. If there are any pets around you, be aware of where they are. Review your medicines with your doctor. Some medicines can make you feel dizzy. This can increase your chance of falling. Ask your doctor what other things that you can do to help prevent falls. This information is not intended to replace advice given to you by your health care provider. Make sure  you discuss any questions you have with your health care provider. Document Released: 06/24/2009 Document Revised: 02/03/2016 Document Reviewed: 10/02/2014 Elsevier Interactive Patient Education  2017 [employer].

## 2023-03-05 NOTE — Progress Notes (Signed)
Subjective:   Rebecca Miles is a 57 y.o. female who presents for Medicare Annual (Subsequent) preventive examination.  Visit Complete: Virtual  I connected with  Rebecca Miles on 03/05/23 by a audio enabled telemedicine application and verified that I am speaking with the correct person using two identifiers.  Patient Location: Home  Provider Location: Home Office  I discussed the limitations of evaluation and management by telemedicine. The patient expressed understanding and agreed to proceed.  Patient Medicare AWV questionnaire was completed by the patient on 03/05/2023; I have confirmed that all information answered by patient is correct and no changes since this date.  Review of Systems     Cardiac Risk Factors include: advanced age (>22men, >46 women);dyslipidemia;hypertension     Objective:    Today's Vitals   03/05/23 0947  Weight: 212 lb (96.2 kg)  Height: 4\' 11"  (1.499 m)   Body mass index is 42.82 kg/m.     03/05/2023    9:51 AM 03/02/2022    9:50 AM 03/01/2021    1:30 PM 07/15/2019    7:35 AM 05/29/2018    8:34 AM 10/25/2017    3:49 PM 09/26/2012    5:00 PM  Advanced Directives  Does Patient Have a Medical Advance Directive? No No No No No No Patient does not have advance directive;Patient would not like information  Would patient like information on creating a medical advance directive? No - Patient declined No - Patient declined Yes (MAU/Ambulatory/Procedural Areas - Information given) No - Patient declined  No - Patient declined   Pre-existing out of facility DNR order (yellow form or pink MOST form)       No    Current Medications (verified) Outpatient Encounter Medications as of 03/05/2023  Medication Sig   acetaminophen (TYLENOL) 650 MG CR tablet Take 1,300 mg by mouth every 8 (eight) hours as needed for pain.   fluticasone (FLONASE) 50 MCG/ACT nasal spray Place 2 sprays into both nostrils daily. (Patient taking differently: Place 2 sprays into  both nostrils as needed for allergies.)   hydrochlorothiazide (MICROZIDE) 12.5 MG capsule Take 12.5 mg by mouth daily.   K Phos Mono-Sod Phos Di & Mono (PHOSPHA 250 NEUTRAL) 155-852-130 MG TABS TAKE 1 TABLET (250 MG TOTAL) BY MOUTH 4 (FOUR) TIMES DAILY - AFTER MEALS AND AT BEDTIME.   levothyroxine (SYNTHROID) 125 MCG tablet Take 1 tablet (125 mcg total) by mouth daily.   Magnesium 250 MG TABS Take 250 mg by mouth daily.   Multiple Vitamin (MULTIVITAMIN WITH MINERALS) TABS tablet Take 1 tablet by mouth daily.   olmesartan (BENICAR) 20 MG tablet Take 1 tablet (20 mg total) by mouth daily.   polyethylene glycol powder (GLYCOLAX/MIRALAX) 17 GM/SCOOP powder Take 17 g by mouth daily.   pravastatin (PRAVACHOL) 20 MG tablet TAKE 1 TABLET EVERY DAY   traMADol (ULTRAM) 50 MG tablet Take 1 tablet (50 mg total) by mouth every 6 (six) hours as needed for moderate pain.   triamcinolone cream (KENALOG) 0.1 % APPLY TO AFFECTED AREA TWICE A DAY   Turmeric (QC TUMERIC COMPLEX PO) Take 1 tablet by mouth daily.   No facility-administered encounter medications on file as of 03/05/2023.    Allergies (verified) Patient has no known allergies.   History: Past Medical History:  Diagnosis Date   Allergy    Arthritis    Hypertension    Hypothyroidism    Osteoarthritis    ankles and joints   Rickets    Past Surgical  History:  Procedure Laterality Date   CESAREAN SECTION     COLONOSCOPY N/A 07/15/2019   Diverticulosis in cecum, three 4-6 mm polyps s/p removal. Tubular adenomas, sessile serrated polyps without dysplasia. 3 year surveillance.    FEMUR IM NAIL  09/26/2012   Procedure: INTRAMEDULLARY (IM) RETROGRADE FEMORAL NAILING;  Surgeon: Loanne Drilling, MD;  Location: [facility];  Service: Orthopedics;  Laterality: Right;   LEG SURGERY     bilateral at age 69    POLYPECTOMY  07/15/2019   Procedure: POLYPECTOMY;  Surgeon: Corbin Ade, MD;  Location: [facility];  Service: Endoscopy;;  cecal    TIBIA  OSTEOTOMY  09/26/2012   Procedure: TIBIAL OSTEOTOMY;  Surgeon: Loanne Drilling, MD;  Location: [facility];  Service: Orthopedics;  Laterality: Right;  RIGHT FEMORAL CORRECTIVE OSTEOMITY WITH RETROGRADE IM FEMORAL NAIL   TUBAL LIGATION     TUBAL LIGATION     Family History  Problem Relation Age of Onset   Diabetes Mother    Hypertension Mother    Kidney disease Mother    Parkinson's disease Father    Lupus Sister    Arthritis Maternal Grandmother    Hypertension Maternal Grandmother    Kidney disease Maternal Grandmother    Drug abuse Brother    Early death Brother    Stroke Brother    Graves' disease Son    Myasthenia gravis Son    Rickets Son    Rickets Son    Rickets Son    ADD / ADHD Son    Colon cancer Neg Hx    Colon polyps Neg Hx    Breast cancer Neg Hx    Social History   Socioeconomic History   Marital status: Married    Spouse name: Gery Pray   Number of children: 4   Years of education: 13   Highest education level: Some college, no degree  Occupational History   Occupation: disabled  Tobacco Use   Smoking status: Former    Packs/day: 0.50    Years: 20.00    Additional pack years: 0.00    Total pack years: 10.00    Types: Cigarettes    Quit date: 09/12/1999    Years since quitting: 23.4   Smokeless tobacco: Never   Tobacco comments:    No current tobacco use  Vaping Use   Vaping Use: Never used  Substance and Sexual Activity   Alcohol use: Yes    Comment: occasional   Drug use: No   Sexual activity: Not Currently  Other Topics Concern   Not on file  Social History Narrative   Married; All 4 of her children and 2 grandchildren live with her.   Social Determinants of Health   Financial Resource Strain: Low Risk  (03/05/2023)   Overall Financial Resource Strain (CARDIA)    Difficulty of Paying Living Expenses: Not hard at all  Food Insecurity: No Food Insecurity (03/05/2023)   Hunger Vital Sign    Worried About Running Out of Food in the Last Year: Never  true    Ran Out of Food in the Last Year: Never true  Transportation Needs: No Transportation Needs (03/05/2023)   PRAPARE - Administrator, Civil Service (Medical): No    Lack of Transportation (Non-Medical): No  Physical Activity: Insufficiently Active (03/05/2023)   Exercise Vital Sign    Days of Exercise per Week: 3 days    Minutes of Exercise per Session: 30 min  Stress: No Stress Concern  Present (03/05/2023)   [employer] of Occupational Health - Occupational Stress Questionnaire    Feeling of Stress : Not at all  Social Connections: Moderately Integrated (03/05/2023)   Social Connection and Isolation Panel [NHANES]    Frequency of Communication with Friends and Family: More than three times a week    Frequency of Social Gatherings with Friends and Family: More than three times a week    Attends Religious Services: More than 4 times per year    Active Member of [employer] or Organizations: No    Attends Engineer, structural: Never    Marital Status: Married    Tobacco Counseling Counseling given: Not Answered Tobacco comments: No current tobacco use   Clinical Intake:  Pre-visit preparation completed: Yes  Pain : No/denies pain     Nutritional Risks: None Diabetes: No  How often do you need to have someone help you when you read instructions, pamphlets, or other written materials from your doctor or pharmacy?: 1 - Never  Interpreter Needed?: No  Information entered by :: Renie Ora, LPN   Activities of Daily Living    03/05/2023    9:51 AM  In your present state of health, do you have any difficulty performing the following activities:  Hearing? 0  Vision? 0  Difficulty concentrating or making decisions? 0  Walking or climbing stairs? 0  Dressing or bathing? 0  Doing errands, shopping? 0  Preparing Food and eating ? N  Using the Toilet? N  In the past six months, have you accidently leaked urine? N  Do you have problems with loss of  bowel control? N  Managing your Medications? N  Managing your Finances? N  Housekeeping or managing your Housekeeping? N    Patient Care Team: Dettinger, Elige Radon, MD as PCP - General (Family Medicine) Jena Gauss Gerrit Friends, MD as Consulting Physician (Gastroenterology) Ollen Gross, MD as Consulting Physician (Orthopedic Surgery)  Indicate any recent Medical Services you may have received from other than [facility] providers in the past year (date may be approximate).     Assessment:   This is a routine wellness examination for Rebecca Miles.  Hearing/Vision screen Vision Screening - Comments:: Wears rx glasses - up to date with routine eye exams with  Dr.Davis   Dietary issues and exercise activities discussed:     Goals Addressed             This Visit's Progress    DIET - INCREASE WATER INTAKE         Depression Screen    03/05/2023    9:50 AM 11/22/2022    8:32 AM 06/22/2022    2:43 PM 05/26/2022    1:40 PM 05/04/2022    3:03 PM 03/02/2022    9:49 AM 12/26/2021   11:11 AM  PHQ 2/9 Scores  PHQ - 2 Score 0 0 0 1 2 0 0  PHQ- 9 Score  1 1 2 4  2     Fall Risk    03/05/2023    9:48 AM 11/22/2022    8:31 AM 06/22/2022    2:42 PM 05/26/2022    1:40 PM 05/04/2022    3:03 PM  Fall Risk   Falls in the past year? 0 1 1 1 1   Number falls in past yr: 0 1 1 1 1   Injury with Fall? 0 1 1 1 1   Risk for fall due to : No Fall Risks Impaired balance/gait;Impaired mobility History of fall(s) History of fall(s) History  of fall(s)  Follow up Falls prevention discussed Falls evaluation completed  Falls evaluation completed Education provided    MEDICARE RISK AT HOME:  Medicare Risk at Home - 03/05/23 0947     Any stairs in or around the home? Yes    If so, are there any without handrails? No    Home free of loose throw rugs in walkways, pet beds, electrical cords, etc? Yes    Adequate lighting in your home to reduce risk of falls? Yes    Life alert? No    Use of a cane, walker or w/c? Yes     Grab bars in the bathroom? Yes    Shower chair or bench in shower? Yes    Elevated toilet seat or a handicapped toilet? Yes             TIMED UP AND GO:  Was the test performed?  No    Cognitive Function:    10/25/2017    3:55 PM  MMSE - Mini Mental State Exam  Orientation to time 5  Orientation to Place 5  Registration 3  Attention/ Calculation 5  Recall 3  Language- name 2 objects 2  Language- repeat 1  Language- follow 3 step command 3  Language- read & follow direction 1  Write a sentence 1  Copy design 1  Total score 30        03/05/2023    9:51 AM 03/02/2022    9:53 AM 03/01/2021    1:32 PM  6CIT Screen  What Year? 0 points 0 points 0 points  What month? 0 points 0 points 0 points  What time? 0 points 0 points 0 points  Count back from 20 0 points 0 points 0 points  Months in reverse 0 points 2 points 2 points  Repeat phrase 0 points 0 points 4 points  Total Score 0 points 2 points 6 points    Immunizations Immunization History  Administered Date(s) Administered   DTaP 05/07/1969, 06/18/1969, 09/17/1969, 06/17/1970, 06/16/1971   Hepatitis B 03/14/1999, 04/15/1999, 04/29/2001   IPV 06/17/1970, 06/16/1971, 06/26/1974   Influenza, Quadrivalent, Recombinant, Inj, Pf 06/19/2019   Influenza,inj,Quad PF,6+ Mos 06/22/2017, 06/19/2018, 07/08/2020, 06/20/2021, 05/26/2022   Influenza-Unspecified 06/19/2019   Moderna Sars-Covid-2 Vaccination 11/04/2019, 11/25/2019   Rubella 11/05/1969   Td 04/30/1996   Tdap 04/11/2008, 05/11/2021    TDAP status: Up to date  Flu Vaccine status: Up to date  Pneumococcal vaccine status: Due, Education has been provided regarding the importance of this vaccine. Advised may receive this vaccine at local pharmacy or Health Dept. Aware to provide a copy of the vaccination record if obtained from local pharmacy or Health Dept. Verbalized acceptance and understanding.  Covid-19 vaccine status: Completed vaccines  Qualifies for  Shingles Vaccine? Yes   Zostavax completed No   Shingrix Completed?: No.    Education has been provided regarding the importance of this vaccine. Patient has been advised to call insurance company to determine out of pocket expense if they have not yet received this vaccine. Advised may also receive vaccine at local pharmacy or Health Dept. Verbalized acceptance and understanding.  Screening Tests Health Maintenance  Topic Date Due   COVID-19 Vaccine (3 - 2023-24 season) 05/12/2022   Colonoscopy  07/14/2022   MAMMOGRAM  12/06/2022   Zoster Vaccines- Shingrix (1 of 2) 11/22/2023 (Originally 09/13/2017)   INFLUENZA VACCINE  04/12/2023   PAP SMEAR-Modifier  12/02/2023   Medicare Annual Wellness (AWV)  03/04/2024   DTaP/Tdap/Td (8 -  Td or Tdap) 05/12/2031   HPV VACCINES  Aged Out   Hepatitis C Screening  Discontinued   HIV Screening  Discontinued    Health Maintenance  Health Maintenance Due  Topic Date Due   COVID-19 Vaccine (3 - 2023-24 season) 05/12/2022   Colonoscopy  07/14/2022   MAMMOGRAM  12/06/2022    Colorectal cancer screening: Referral to GI placed 03/05/2023. Pt aware the office will call re: appt.  Mammogram status: Ordered 03/05/2023. Pt provided with contact info and advised to call to schedule appt.   Bone Density status: Ordered not of age . Pt provided with contact info and advised to call to schedule appt.  Lung Cancer Screening: (Low Dose CT Chest recommended if Age 21-80 years, 20 pack-year currently smoking OR have quit w/in 15years.) does not qualify.   Lung Cancer Screening Referral: n/a  Additional Screening:  Hepatitis C Screening: does not qualify;   Vision Screening: Recommended annual ophthalmology exams for early detection of glaucoma and other disorders of the eye. Is the patient up to date with their annual eye exam?  Yes  Who is the provider or what is the name of the office in which the patient attends annual eye exams? Dr.Davis  If pt is not  established with a provider, would they like to be referred to a provider to establish care? No .   Dental Screening: Recommended annual dental exams for proper oral hygiene  Diabetic Foot Exam: Diabetic Foot Exam: Overdue, Pt has been advised about the importance in completing this exam. Pt is scheduled for diabetic foot exam on next office visit .  Community Resource Referral / Chronic Care Management: CRR required this visit?  No   CCM required this visit?  No     Plan:     I have personally reviewed and noted the following in the patient's chart:   Medical and social history Use of alcohol, tobacco or illicit drugs  Current medications and supplements including opioid prescriptions. Patient is not currently taking opioid prescriptions. Functional ability and status Nutritional status Physical activity Advanced directives List of other physicians Hospitalizations, surgeries, and ER visits in previous 12 months Vitals Screenings to include cognitive, depression, and falls Referrals and appointments  In addition, I have reviewed and discussed with patient certain preventive protocols, quality metrics, and best practice recommendations. A written personalized care plan for preventive services as well as general preventive health recommendations were provided to patient.     Lorrene Reid, LPN   09/29/1476   After Visit Summary: (MyChart) Due to this being a telephonic visit, the after visit summary with patients personalized plan was offered to patient via MyChart   Nurse Notes: Due Pneumonia Vaccine

## 2023-03-08 ENCOUNTER — Encounter: Payer: Self-pay | Admitting: *Deleted

## 2023-04-15 ENCOUNTER — Other Ambulatory Visit: Payer: Self-pay | Admitting: Family Medicine

## 2023-06-05 ENCOUNTER — Other Ambulatory Visit: Payer: Self-pay | Admitting: Family Medicine

## 2023-06-09 DIAGNOSIS — Z79899 Other long term (current) drug therapy: Secondary | ICD-10-CM | POA: Diagnosis not present

## 2023-06-09 DIAGNOSIS — I10 Essential (primary) hypertension: Secondary | ICD-10-CM | POA: Diagnosis not present

## 2023-06-09 DIAGNOSIS — E079 Disorder of thyroid, unspecified: Secondary | ICD-10-CM | POA: Diagnosis not present

## 2023-06-09 DIAGNOSIS — Z79890 Hormone replacement therapy (postmenopausal): Secondary | ICD-10-CM | POA: Diagnosis not present

## 2023-06-09 DIAGNOSIS — M79603 Pain in arm, unspecified: Secondary | ICD-10-CM | POA: Diagnosis not present

## 2023-06-09 DIAGNOSIS — R079 Chest pain, unspecified: Secondary | ICD-10-CM | POA: Diagnosis not present

## 2023-06-09 DIAGNOSIS — Z87891 Personal history of nicotine dependence: Secondary | ICD-10-CM | POA: Diagnosis not present

## 2023-06-09 DIAGNOSIS — E785 Hyperlipidemia, unspecified: Secondary | ICD-10-CM | POA: Diagnosis not present

## 2023-06-13 ENCOUNTER — Ambulatory Visit: Payer: Medicare PPO | Admitting: Family Medicine

## 2023-06-13 ENCOUNTER — Encounter: Payer: Self-pay | Admitting: Family Medicine

## 2023-06-13 VITALS — BP 117/78 | HR 82 | Ht 59.0 in | Wt 221.0 lb

## 2023-06-13 DIAGNOSIS — E039 Hypothyroidism, unspecified: Secondary | ICD-10-CM

## 2023-06-13 DIAGNOSIS — I10 Essential (primary) hypertension: Secondary | ICD-10-CM

## 2023-06-13 DIAGNOSIS — R0789 Other chest pain: Secondary | ICD-10-CM | POA: Diagnosis not present

## 2023-06-13 NOTE — Progress Notes (Signed)
BP 117/78   Pulse 82   Ht 4\' 11"  (1.499 m)   Wt 221 lb (100.2 kg)   LMP 10/06/2013   SpO2 96%   BMI 44.64 kg/m    Subjective:   Patient ID: Rebecca Miles, female    DOB: 1966/01/16, 57 y.o.   MRN: [id number]  HPI: Rebecca Miles is a 57 y.o. female presenting on 06/13/2023 for Hospitalization Follow-up (Chest pain. Concerned that levothyroxine is causing) and Hypertension   HPI Chest pain Patient is coming in today for chest pain and elevated blood pressure.  She was in the ER on 06/09/2023.  She had a cardiac workup and was sent home.  Her blood pressure is up today and she is concerned about that as well as were her thyroid is.  She was concerned that her thyroid might be causing the chest pain.  The chest pain that she was having or pressure was right-sided and felt worse with range of motion of her shoulder, it felt like it pulled on that part of her chest.  Since leaving the emergency department she says she has not had any more chest pain.  She denies any shortness of breath or wheezing.  Her blood pressure on recheck was 117/78.  Relevant past medical, surgical, family and social history reviewed and updated as indicated. Interim medical history since our last visit reviewed. Allergies and medications reviewed and updated.  Review of Systems  Constitutional:  Negative for chills and fever.  Eyes:  Negative for redness and visual disturbance.  Respiratory:  Positive for chest tightness. Negative for shortness of breath and wheezing.   Cardiovascular:  Negative for chest pain, palpitations and leg swelling.  Genitourinary:  Negative for difficulty urinating and dysuria.  Musculoskeletal:  Negative for back pain and gait problem.  Skin:  Negative for rash.  Neurological:  Negative for light-headedness and headaches.  Psychiatric/Behavioral:  Negative for agitation and behavioral problems.   All other systems reviewed and are negative.   Per HPI unless specifically  indicated above   Allergies as of 06/13/2023   No Known Allergies      Medication List        Accurate as of June 13, 2023  4:34 PM. If you have any questions, ask your nurse or doctor.          acetaminophen 650 MG CR tablet Commonly known as: TYLENOL Take 1,300 mg by mouth every 8 (eight) hours as needed for pain.   fluticasone 50 MCG/ACT nasal spray Commonly known as: FLONASE Place 2 sprays into both nostrils daily. What changed:  when to take this reasons to take this   hydrochlorothiazide 12.5 MG capsule Commonly known as: MICROZIDE Take 12.5 mg by mouth daily.   ketorolac 10 MG tablet Commonly known as: TORADOL Take 10 mg by mouth every 6 (six) hours as needed.   levothyroxine 125 MCG tablet Commonly known as: SYNTHROID Take 1 tablet (125 mcg total) by mouth daily before breakfast.   Magnesium 250 MG Tabs Take 250 mg by mouth daily.   multivitamin with minerals Tabs tablet Take 1 tablet by mouth daily.   olmesartan 20 MG tablet Commonly known as: BENICAR TAKE 1 TABLET EVERY DAY   Phospha 250 Neutral 155-852-130 MG Tabs TAKE 1 TABLET (250 MG TOTAL) BY MOUTH 4 (FOUR) TIMES DAILY - AFTER MEALS AND AT BEDTIME.   polyethylene glycol powder 17 GM/SCOOP powder Commonly known as: GLYCOLAX/MIRALAX Take 17 g by mouth daily.   pravastatin  20 MG tablet Commonly known as: PRAVACHOL TAKE 1 TABLET EVERY DAY   QC TUMERIC COMPLEX PO Take 1 tablet by mouth daily.   traMADol 50 MG tablet Commonly known as: ULTRAM Take 1 tablet (50 mg total) by mouth every 6 (six) hours as needed for moderate pain.   triamcinolone cream 0.1 % Commonly known as: KENALOG APPLY TO AFFECTED AREA TWICE A DAY         Objective:   BP 117/78   Pulse 82   Ht 4\' 11"  (1.499 m)   Wt 221 lb (100.2 kg)   LMP 10/06/2013   SpO2 96%   BMI 44.64 kg/m   Wt Readings from Last 3 Encounters:  06/13/23 221 lb (100.2 kg)  03/05/23 212 lb (96.2 kg)  02/15/23 213 lb (96.6 kg)     Physical Exam Vitals and nursing note reviewed.  Constitutional:      General: She is not in acute distress.    Appearance: She is well-developed. She is not diaphoretic.  Eyes:     Conjunctiva/sclera: Conjunctivae normal.  Cardiovascular:     Rate and Rhythm: Normal rate and regular rhythm.     Heart sounds: Normal heart sounds. No murmur heard. Pulmonary:     Effort: Pulmonary effort is normal. No respiratory distress.     Breath sounds: Normal breath sounds. No wheezing.  Musculoskeletal:        General: No tenderness. Normal range of motion.  Skin:    General: Skin is warm and dry.     Findings: No rash.  Neurological:     Mental Status: She is alert and oriented to person, place, and time.     Coordination: Coordination normal.  Psychiatric:        Behavior: Behavior normal.       Assessment & Plan:   Problem List Items Addressed This Visit       Cardiovascular and Mediastinum   Hypertension   Relevant Orders   CMP14+EGFR   CBC with Differential/Platelet   Ambulatory referral to Cardiology     Endocrine   Hypothyroidism - Primary   Relevant Orders   TSH   CMP14+EGFR   CBC with Differential/Platelet   Other Visit Diagnoses     Atypical chest pain       Relevant Orders   TSH   CMP14+EGFR   CBC with Differential/Platelet   Ambulatory referral to Cardiology       Will place referral to cardiology because of possible atypical chest tightness.  She is not having any today.  Will also recheck thyroid to make sure it is doing okay. Follow up plan: Return if symptoms worsen or fail to improve.  Counseling provided for all of the vaccine components Orders Placed This Encounter  Procedures   TSH   CMP14+EGFR   CBC with Differential/Platelet   Ambulatory referral to Cardiology    Arville Care, MD [facility] 06/13/2023, 4:34 PM

## 2023-06-14 LAB — CBC WITH DIFFERENTIAL/PLATELET
Basophils Absolute: 0.1 10*3/uL (ref 0.0–0.2)
Basos: 1 %
EOS (ABSOLUTE): 0.1 10*3/uL (ref 0.0–0.4)
Eos: 1 %
Hematocrit: 43.1 % (ref 34.0–46.6)
Hemoglobin: 14.6 g/dL (ref 11.1–15.9)
Immature Grans (Abs): 0 10*3/uL (ref 0.0–0.1)
Immature Granulocytes: 0 %
Lymphocytes Absolute: 2.8 10*3/uL (ref 0.7–3.1)
Lymphs: 27 %
MCH: 31.5 pg (ref 26.6–33.0)
MCHC: 33.9 g/dL (ref 31.5–35.7)
MCV: 93 fL (ref 79–97)
Monocytes Absolute: 0.8 10*3/uL (ref 0.1–0.9)
Monocytes: 8 %
Neutrophils Absolute: 6.6 10*3/uL (ref 1.4–7.0)
Neutrophils: 63 %
Platelets: 392 10*3/uL (ref 150–450)
RBC: 4.63 x10E6/uL (ref 3.77–5.28)
RDW: 12.1 % (ref 11.7–15.4)
WBC: 10.4 10*3/uL (ref 3.4–10.8)

## 2023-06-14 LAB — CMP14+EGFR
ALT: 17 IU/L (ref 0–32)
AST: 20 IU/L (ref 0–40)
Albumin: 4.2 g/dL (ref 3.8–4.9)
Alkaline Phosphatase: 153 IU/L — ABNORMAL HIGH (ref 44–121)
BUN/Creatinine Ratio: 10 (ref 9–23)
BUN: 7 mg/dL (ref 6–24)
Bilirubin Total: 0.4 mg/dL (ref 0.0–1.2)
CO2: 23 mmol/L (ref 20–29)
Calcium: 9.6 mg/dL (ref 8.7–10.2)
Chloride: 103 mmol/L (ref 96–106)
Creatinine, Ser: 0.68 mg/dL (ref 0.57–1.00)
Globulin, Total: 2.5 g/dL (ref 1.5–4.5)
Glucose: 86 mg/dL (ref 70–99)
Potassium: 4.4 mmol/L (ref 3.5–5.2)
Sodium: 139 mmol/L (ref 134–144)
Total Protein: 6.7 g/dL (ref 6.0–8.5)
eGFR: 103 mL/min/{1.73_m2} (ref >59)

## 2023-06-14 LAB — TSH: TSH: 0.492 u[IU]/mL (ref 0.450–4.500)

## 2023-07-20 ENCOUNTER — Ambulatory Visit (INDEPENDENT_AMBULATORY_CARE_PROVIDER_SITE_OTHER): Payer: Medicare PPO | Admitting: Nurse Practitioner

## 2023-07-20 ENCOUNTER — Encounter: Payer: Self-pay | Admitting: Nurse Practitioner

## 2023-07-20 VITALS — BP 145/86 | HR 90 | Temp 96.9°F | Resp 20 | Ht 59.0 in | Wt 224.0 lb

## 2023-07-20 DIAGNOSIS — M25561 Pain in right knee: Secondary | ICD-10-CM | POA: Diagnosis not present

## 2023-07-20 DIAGNOSIS — M25562 Pain in left knee: Secondary | ICD-10-CM | POA: Diagnosis not present

## 2023-07-20 DIAGNOSIS — G8929 Other chronic pain: Secondary | ICD-10-CM

## 2023-07-20 DIAGNOSIS — Z23 Encounter for immunization: Secondary | ICD-10-CM | POA: Diagnosis not present

## 2023-07-20 MED ORDER — METHYLPREDNISOLONE ACETATE 40 MG/ML IJ SUSP
40.0000 mg | Freq: Once | INTRAMUSCULAR | Status: AC
  Administered 2023-07-20: 40 mg via INTRAMUSCULAR

## 2023-07-20 MED ORDER — BUPIVACAINE HCL 0.25 % IJ SOLN
1.0000 mL | Freq: Once | INTRAMUSCULAR | Status: AC
  Administered 2023-07-20: 1 mL via INTRA_ARTICULAR

## 2023-07-20 NOTE — Progress Notes (Signed)
Subjective:    Patient ID: Rebecca Miles, female    DOB: 06/27/1966, 57 y.o.   MRN: [id number]   Chief Complaint: Bilateral knee pain (Wants injections)   HPI Patient comes in c/o bil knee pain. Has been going on for years. She has seen DR alussio before. She is not ready for total knee. She has had femur straighten and rod inserted in past. She has had knees injected which helps. Last injected in march.  Patient Active Problem List   Diagnosis Date Noted   History of diverticulitis 06/19/2019   Clinical rickets 12/31/2017   Overactive bladder 12/31/2017   Walking difficulty due to joint disorder involving multiple sites 12/31/2017   Bilateral hip joint arthritis 12/31/2017   Hypothyroidism 05/21/2017   Hypertension 05/21/2017   Prediabetes 05/21/2017   OA (osteoarthritis) of knee 09/26/2012       Review of Systems  Constitutional:  Negative for diaphoresis.  Eyes:  Negative for pain.  Respiratory:  Negative for shortness of breath.   Cardiovascular:  Negative for chest pain, palpitations and leg swelling.  Gastrointestinal:  Negative for abdominal pain.  Endocrine: Negative for polydipsia.  Skin:  Negative for rash.  Neurological:  Negative for dizziness, weakness and headaches.  Hematological:  Does not bruise/bleed easily.  All other systems reviewed and are negative.      Objective:   Physical Exam Vitals and nursing note reviewed.  Constitutional:      General: She is not in acute distress.    Appearance: Normal appearance. She is well-developed.  Neck:     Vascular: No carotid bruit or JVD.  Cardiovascular:     Rate and Rhythm: Normal rate and regular rhythm.     Heart sounds: Normal heart sounds.  Pulmonary:     Effort: Pulmonary effort is normal. No respiratory distress.     Breath sounds: Normal breath sounds. No wheezing or rales.  Chest:     Chest wall: No tenderness.  Abdominal:     General: Bowel sounds are normal. There is no distension or  abdominal bruit.     Palpations: Abdomen is soft. There is no hepatomegaly, splenomegaly, mass or pulsatile mass.     Tenderness: There is no abdominal tenderness.  Musculoskeletal:        General: Normal range of motion.     Cervical back: Normal range of motion and neck supple.     Comments: Gait slow and steady with cane. Left leg bowed FROM of bil knees with flexion and extension.  Lymphadenopathy:     Cervical: No cervical adenopathy.  Skin:    General: Skin is warm and dry.  Neurological:     Mental Status: She is alert and oriented to person, place, and time.     Deep Tendon Reflexes: Reflexes are normal and symmetric.  Psychiatric:        Behavior: Behavior normal.        Thought Content: Thought content normal.        Judgment: Judgment normal.    BP (!) 145/86   Pulse 90   Temp (!) 96.9 F (36.1 C) (Temporal)   Resp 20   Ht 4\' 11"  (1.499 m)   Wt 224 lb (101.6 kg)   LMP 10/06/2013   SpO2 98%   BMI 45.24 kg/m    Joint Injection/Arthrocentesis  Date/Time: 07/20/2023 10:09 AM  Performed by: Bennie Pierini, FNP Authorized by: Bennie Pierini, FNP  Indications: pain  Body area: knee Joint: right knee Local  anesthesia used: no  Anesthesia: Local anesthesia used: no  Sedation: Patient sedated: no  Preparation: Patient was prepped and draped in the usual sterile fashion. Needle size: 22 G Approach: superior Methylprednisolone amount: 40 mg Bupivacaine 0.25% amount: 1 mL Patient tolerance: patient tolerated the procedure well with no immediate complications   Joint Injection/Arthrocentesis  Date/Time: 07/20/2023 10:10 AM  Performed by: Bennie Pierini, FNP Authorized by: Daphine Deutscher Mary-Margaret, FNP  Indications: pain  Local anesthesia used: no  Anesthesia: Local anesthesia used: no  Sedation: Patient sedated: no  Preparation: Patient was prepped and draped in the usual sterile fashion. Needle size: 22 G Ultrasound guidance:  no Approach: superior Methylprednisolone amount: 40 mg Bupivacaine 0.25% amount: 1 mL Patient tolerance: patient tolerated the procedure well with no immediate complications   Joint Injection/Arthrocentesis  Date/Time: 07/24/2023 4:24 PM  Performed by: Bennie Pierini, FNP Authorized by: Daphine Deutscher Mary-Margaret, FNP  Indications: pain  Body area: knee Joint: left knee Local anesthesia used: no  Anesthesia: Local anesthesia used: no  Sedation: Patient sedated: no  Needle size: 22 G Approach: superior Patient tolerance: patient tolerated the procedure well with no immediate complications         Assessment & Plan:   Rebecca Miles in today with chief complaint of Bilateral knee pain (Wants injections)   1. Chronic pain of left knee - methylPREDNISolone acetate (DEPO-MEDROL) injection 40 mg - bupivacaine (MARCAINE) 0.25 % (with pres) injection 1 mL  2. Chronic pain of right knee - methylPREDNISolone acetate (DEPO-MEDROL) injection 40 mg - bupivacaine (MARCAINE) 0.25 % (with pres) injection 1 mL  Ice Elevate RTO prn  The above assessment and management plan was discussed with the patient. The patient verbalized understanding of and has agreed to the management plan. Patient is aware to call the clinic if symptoms persist or worsen. Patient is aware when to return to the clinic for a follow-up visit. Patient educated on when it is appropriate to go to the emergency department.   Mary-Margaret Daphine Deutscher, FNP

## 2023-07-20 NOTE — Patient Instructions (Signed)
Joint Steroid Injection A joint steroid injection is a procedure to relieve swelling and pain in a joint. Steroids are medicines that reduce inflammation. In this procedure, your health care provider uses a syringe and a needle to inject a steroid medicine into a painful and inflamed joint. A pain-relieving medicine (anesthetic) may be injected along with the steroid. In some cases, your health care provider may use an imaging technique such as ultrasound or fluoroscopy to guide the injection. Joints that are often treated with steroid injections include the knee, shoulder, hip, and spine. These injections may also be used in the elbow, ankle, and joints of the hands or feet. You may have joint steroid injections as part of your treatment for inflammation caused by: Gout. Rheumatoid arthritis. Advanced wear-and-tear arthritis (osteoarthritis). Tendinitis. Bursitis. Joint steroid injections may be repeated, but having them too often can damage a joint or the skin over the joint. You should not have joint steroid injections less than 6 weeks apart or more than four times a year. Tell a health care provider about: Any allergies you have. All medicines you are taking, including vitamins, herbs, eye drops, creams, and over-the-counter medicines. Any problems you or family members have had with anesthetic medicines. Any blood disorders you have. Any surgeries you have had. Any medical conditions you have. Whether you are pregnant or may be pregnant. What are the risks? Generally, this is a safe treatment. However, problems may occur, including: Infection. Bleeding. Allergic reactions to medicines. Damage to the joint or tissues around the joint. Thinning of skin or loss of skin color over the joint. Temporary flushing of the face or chest. Temporary increase in pain. Temporary increase in blood sugar. Failure to relieve inflammation or pain. What happens before the treatment? Medicines Ask  your health care provider about: Changing or stopping your regular medicines. This is especially important if you are taking diabetes medicines or blood thinners. Taking medicines such as aspirin and ibuprofen. These medicines can thin your blood. Do not take these medicines unless your health care provider tells you to take them. Taking over-the-counter medicines, vitamins, herbs, and supplements. General instructions You may have imaging tests of your joint. Ask your health care provider if you can drive yourself home after the procedure. What happens during the treatment?  Your health care provider will position you for the injection and locate the injection site over your joint. The skin over the joint will be cleaned with a germ-killing soap. Your health care provider may: Spray a numbing solution (topical anesthetic) over the injection site. Inject a local anesthetic under the skin above your joint. The needle will be placed through your skin into your joint. Your health care provider may use imaging to guide the needle to the right spot for the injection. If imaging is used, a special contrast dye may be injected to confirm that the needle is in the correct location. The steroid medicine will be injected into your joint. Anesthetic may be injected along with the steroid. This may be a medicine that relieves pain for a short time (short-acting anesthetic) or for a longer time (long-acting anesthetic). The needle will be removed, and an adhesive bandage (dressing) will be placed over the injection site. The procedure may vary among health care providers and hospitals. What can I expect after the treatment? You will be able to go home after the treatment. It is normal to feel slight flushing for a few days after the injection. After the treatment, it is  common to have an increase in joint pain after the anesthetic has worn off. This may happen about an hour after a short-acting anesthetic  or about 8 hours after a longer-acting anesthetic. You should begin to feel relief from joint pain and swelling after 24 to 48 hours. Contact your health care provider if you do not begin to feel relief after 2 days. Follow these instructions at home: Injection site care Leave the adhesive dressing over your injection site in place until your health care provider says you can remove it. Check your injection site every day for signs of infection. Check for: More redness, swelling, or pain. Fluid or blood. Warmth. Pus or a bad smell. Activity Return to your normal activities as told by your health care provider. Ask your health care provider what activities are safe for you. You may be asked to limit activities that put stress on the joint for a few days. Do joint exercises as told by your health care provider. Do not take baths, swim, or use a hot tub until your health care provider approves. Ask your health care provider if you may take showers. You may only be allowed to take sponge baths. Managing pain, stiffness, and swelling  If directed, put ice on the joint. To do this: Put ice in a plastic bag. Place a towel between your skin and the bag. Leave the ice on for 20 minutes, 2-3 times a day. Remove the ice if your skin turns bright red. This is very important. If you cannot feel pain, heat, or cold, you have a greater risk of damage to the area. Raise (elevate) your joint above the level of your heart when you are sitting or lying down. General instructions Take over-the-counter and prescription medicines only as told by your health care provider. Do not use any products that contain nicotine or tobacco, such as cigarettes, e-cigarettes, and chewing tobacco. These can delay joint healing. If you need help quitting, ask your health care provider. If you have diabetes, be aware that your blood sugar may be slightly elevated for several days after the injection. Keep all follow-up visits.  This is important. Contact a health care provider if you have: Chills or a fever. Any signs of infection at your injection site. Increased pain or swelling or no relief after 2 days. Summary A joint steroid injection is a treatment to relieve pain and swelling in a joint. Steroids are medicines that reduce inflammation. Your health care provider may add an anesthetic along with the steroid. You may have joint steroid injections as part of your arthritis treatment. Joint steroid injections may be repeated, but having them too often can damage a joint or the skin over the joint. Contact your health care provider if you have a fever, chills, or signs of infection, or if you get no relief from joint pain or swelling. This information is not intended to replace advice given to you by your health care provider. Make sure you discuss any questions you have with your health care provider. Document Revised: 02/06/2020 Document Reviewed: 02/06/2020 Elsevier Patient Education  2024 [employer].

## 2023-08-15 ENCOUNTER — Ambulatory Visit: Payer: Medicare PPO | Admitting: Family Medicine

## 2023-08-18 ENCOUNTER — Other Ambulatory Visit: Payer: Self-pay | Admitting: Family Medicine

## 2023-08-20 ENCOUNTER — Encounter: Payer: Self-pay | Admitting: Family Medicine

## 2023-08-20 ENCOUNTER — Ambulatory Visit: Payer: Medicare PPO | Admitting: Family Medicine

## 2023-08-20 VITALS — BP 135/81 | HR 80 | Ht 59.0 in | Wt 223.0 lb

## 2023-08-20 DIAGNOSIS — E550 Rickets, active: Secondary | ICD-10-CM

## 2023-08-20 DIAGNOSIS — R7303 Prediabetes: Secondary | ICD-10-CM

## 2023-08-20 DIAGNOSIS — E039 Hypothyroidism, unspecified: Secondary | ICD-10-CM | POA: Diagnosis not present

## 2023-08-20 DIAGNOSIS — I10 Essential (primary) hypertension: Secondary | ICD-10-CM | POA: Diagnosis not present

## 2023-08-20 LAB — BAYER DCA HB A1C WAIVED: HB A1C (BAYER DCA - WAIVED): 5.2 % (ref 4.8–5.6)

## 2023-08-20 MED ORDER — OLMESARTAN MEDOXOMIL 20 MG PO TABS: 20.0000 mg | ORAL_TABLET | Freq: Every day | ORAL | 3 refills | Status: DC

## 2023-08-20 MED ORDER — TRAMADOL HCL 50 MG PO TABS: 50.0000 mg | ORAL_TABLET | Freq: Four times a day (QID) | ORAL | 1 refills | Status: DC | PRN

## 2023-08-20 MED ORDER — PRAVASTATIN SODIUM 20 MG PO TABS: 20.0000 mg | ORAL_TABLET | Freq: Every day | ORAL | 3 refills | Status: DC

## 2023-08-20 NOTE — Progress Notes (Signed)
BP 135/81   Pulse 80   Ht 4\' 11"  (1.499 m)   Wt 223 lb (101.2 kg)   LMP 10/06/2013   SpO2 99%   BMI 45.04 kg/m    Subjective:   Patient ID: Rebecca Miles, female    DOB: March 04, 1966, 57 y.o.   MRN: [id number]  HPI: Rebecca Miles is a 57 y.o. female presenting on 08/20/2023 for Medical Management of Chronic Issues, Hypothyroidism, and Hyperlipidemia  Hypothyroidism recheck Patient is currently taking levothyroxine 125 mcg. She denies any heat or cold intolerance, diarrhea or constipation, and chest pain or palpitations. She reports chronic changes in her hair texture, dry skin, and brittle nails. This has been going on since she was diagnosed with hypothyroidism.  Hypertension Patient is currently taking olmesartan. She is no longer taking the hydrochlorothiazide, perhaps because of issues with urge incontinence. Her blood pressure today is 135/81. She denies lightheadedness or dizziness, headaches, chest pain, vision changes, or shortness of breath.   Prediabetes Patient comes in today for recheck of her prediabetes. Patient is not currently taking any medications as her prediabetes is diet-controlled. Patient has not seen an ophthalmologist this year. Patient denies any new issues with their feet.    Rickets/Knee and ankle osteoarthritis Pain assessment: Cause of pain- Knee and ankle osteoarthritis Pain location- bilateral knees and ankles Pain on scale of 1-10- 5 Frequency- daily What increases pain-walking and standing What makes pain Better-Advil, Tylenol, and rarely tramadol  Effects on ADL - makes it harder walk and stand for long periods of time Any change in general medical condition-no  Current opioids rx- tramadol 50 mg every 6 hours as needed # meds rx- 30/month Effectiveness of current meds-works well Adverse reactions from pain meds-none Morphine equivalent- 20  Pill count performed-No Pain contract signed on: 02/23/2023 Last drug screen - 02/15/2023 (  high risk q53m, moderate risk q70m, low risk yearly ) Urine drug screen today- No Was the N[facility] reviewed- yes  If yes were their any concerning findings? - no   Relevant past medical, surgical, family and social history reviewed and updated as indicated. Interim medical history since our last visit reviewed. Allergies and medications reviewed and updated.  Review of Systems  Constitutional:  Negative for chills and fever.  HENT:  Negative for congestion, sinus pain and sore throat.   Eyes:  Negative for visual disturbance.  Respiratory:  Negative for cough, chest tightness and shortness of breath.   Cardiovascular:  Negative for chest pain, palpitations and leg swelling.  Gastrointestinal:  Negative for abdominal pain, constipation and diarrhea.  Endocrine: Negative for cold intolerance and heat intolerance.  Genitourinary:  Positive for enuresis. Negative for difficulty urinating, dysuria and hematuria.  Musculoskeletal:  Positive for arthralgias, gait problem and joint swelling. Negative for myalgias.  Neurological:  Negative for dizziness, syncope, weakness, light-headedness, numbness and headaches.    Per HPI unless specifically indicated above   Allergies as of 08/20/2023   No Known Allergies      Medication List        Accurate as of August 20, 2023 10:26 AM. If you have any questions, ask your nurse or doctor.          STOP taking these medications    hydrochlorothiazide 12.5 MG capsule Commonly known as: MICROZIDE Stopped by: Elige Radon Maurica Omura       TAKE these medications    acetaminophen 650 MG CR tablet Commonly known as: TYLENOL Take 1,300 mg by mouth every 8 (  eight) hours as needed for pain.   fluticasone 50 MCG/ACT nasal spray Commonly known as: FLONASE Place 2 sprays into both nostrils daily. What changed:  when to take this reasons to take this   ketorolac 10 MG tablet Commonly known as: TORADOL Take 10 mg by mouth every 6 (six) hours as  needed.   levothyroxine 125 MCG tablet Commonly known as: SYNTHROID Take 1 tablet (125 mcg total) by mouth daily before breakfast.   Magnesium 250 MG Tabs Take 250 mg by mouth daily.   multivitamin with minerals Tabs tablet Take 1 tablet by mouth daily.   olmesartan 20 MG tablet Commonly known as: BENICAR Take 1 tablet (20 mg total) by mouth daily.   Phospha 250 Neutral [id number] MG Tabs TAKE 1 TABLET (250 MG TOTAL) BY MOUTH 4 (FOUR) TIMES DAILY - AFTER MEALS AND AT BEDTIME.   polyethylene glycol powder 17 GM/SCOOP powder Commonly known as: GLYCOLAX/MIRALAX Take 17 g by mouth daily.   pravastatin 20 MG tablet Commonly known as: PRAVACHOL Take 1 tablet (20 mg total) by mouth daily.   QC TUMERIC COMPLEX PO Take 1 tablet by mouth daily.   traMADol 50 MG tablet Commonly known as: ULTRAM Take 1 tablet (50 mg total) by mouth every 6 (six) hours as needed for moderate pain (pain score 4-6).   triamcinolone cream 0.1 % Commonly known as: KENALOG APPLY TO AFFECTED AREA TWICE A DAY         Objective:   BP 135/81   Pulse 80   Ht 4\' 11"  (1.499 m)   Wt 223 lb (101.2 kg)   LMP 10/06/2013   SpO2 99%   BMI 45.04 kg/m   Wt Readings from Last 3 Encounters:  08/20/23 223 lb (101.2 kg)  07/20/23 224 lb (101.6 kg)  06/13/23 221 lb (100.2 kg)    Physical Exam Vitals and nursing note reviewed.  Constitutional:      General: She is not in acute distress.    Appearance: Normal appearance. She is obese.  HENT:     Head: Normocephalic and atraumatic.     Right Ear: External ear normal.     Left Ear: External ear normal.  Eyes:     Conjunctiva/sclera: Conjunctivae normal.  Cardiovascular:     Rate and Rhythm: Normal rate and regular rhythm.     Heart sounds: Normal heart sounds.  Pulmonary:     Effort: Pulmonary effort is normal.     Breath sounds: Normal breath sounds. No wheezing or rales.  Abdominal:     General: Abdomen is flat. There is no distension.      Palpations: Abdomen is soft.     Tenderness: There is no abdominal tenderness.  Musculoskeletal:     Cervical back: Normal range of motion and neck supple. No tenderness.     Right lower leg: No edema.     Left lower leg: No edema.     Comments: Ankle effusion near bilateral lateral malleoli with right > left. No pitting edema.  Skin:    General: Skin is warm and dry.     Comments: Depigmentation throughout from vitiligo  Neurological:     Mental Status: She is alert and oriented to person, place, and time.     Gait: Gait abnormal.  Psychiatric:        Mood and Affect: Mood normal.        Behavior: Behavior normal.        Thought Content: Thought content normal.  Judgment: Judgment normal.      Assessment & Plan:   Problem List Items Addressed This Visit       Cardiovascular and Mediastinum   Hypertension   Relevant Medications   olmesartan (BENICAR) 20 MG tablet   pravastatin (PRAVACHOL) 20 MG tablet   Other Relevant Orders   CMP14+EGFR (Completed)   Bayer DCA Hb A1c Waived (Completed)   Lipid panel (Completed)   TSH (Completed)     Endocrine   Hypothyroidism - Primary   Relevant Orders   CMP14+EGFR (Completed)   Bayer DCA Hb A1c Waived (Completed)   Lipid panel (Completed)   TSH (Completed)     Musculoskeletal and Integument   Clinical rickets   Relevant Medications   traMADol (ULTRAM) 50 MG tablet     Other   Prediabetes   Relevant Orders   CMP14+EGFR (Completed)   Bayer DCA Hb A1c Waived (Completed)   Lipid panel (Completed)   TSH (Completed)    Patient seems to be doing well. Hypothyroidism seems well controlled. Will recheck TSH today. Blood pressure well-controlled at 135/81. Will continue with only olmesartan, no hydrochlorothiazide. Encouraged patient to continue monitoring diet for prediabetes. Will recheck HgbA1c today. Patient seems to have a good routine for managing osteoarthritis. Will send refill for tramadol, though patient rarely  uses.  Follow up plan: Return in about 6 months (around 02/18/2024), or if symptoms worsen or fail to improve, for Thyroid and prediabetes and hypertension.  Counseling provided for all of the vaccine components Orders Placed This Encounter  Procedures   CMP14+EGFR   Bayer DCA Hb A1c Waived   Lipid panel   TSH    Gillermina Phy, Medical Student [facility] 08/20/2023, 10:26 AM  Patient seen and examined with Gillermina Phy, medical student.  Agree with assessment and plan above.  Patient seems to doing well with her arthritis after the injection she had a month ago and infrequently uses the tramadol.  Will check A1c today.  Will check other blood work as well Arville Care, MD [facility] 08/24/2023, 12:48 PM

## 2023-08-20 NOTE — Patient Instructions (Signed)
Rebecca Miles for Colonoscopy. [phone].

## 2023-08-21 LAB — CMP14+EGFR
ALT: 18 [IU]/L (ref 0–32)
AST: 15 [IU]/L (ref 0–40)
Albumin: 3.9 g/dL (ref 3.8–4.9)
Alkaline Phosphatase: 129 [IU]/L — ABNORMAL HIGH (ref 44–121)
BUN/Creatinine Ratio: 10 (ref 9–23)
BUN: 7 mg/dL (ref 6–24)
Bilirubin Total: 0.5 mg/dL (ref 0.0–1.2)
CO2: 24 mmol/L (ref 20–29)
Calcium: 9 mg/dL (ref 8.7–10.2)
Chloride: 105 mmol/L (ref 96–106)
Creatinine, Ser: 0.71 mg/dL (ref 0.57–1.00)
Globulin, Total: 2.3 g/dL (ref 1.5–4.5)
Glucose: 92 mg/dL (ref 70–99)
Potassium: 4.1 mmol/L (ref 3.5–5.2)
Sodium: 141 mmol/L (ref 134–144)
Total Protein: 6.2 g/dL (ref 6.0–8.5)
eGFR: 100 mL/min/{1.73_m2} (ref >59)

## 2023-08-21 LAB — LIPID PANEL
Chol/HDL Ratio: 2.7 {ratio} (ref 0.0–4.4)
Cholesterol, Total: 157 mg/dL (ref 100–199)
HDL: 58 mg/dL (ref >39)
LDL Chol Calc (NIH): 86 mg/dL (ref 0–99)
Triglycerides: 67 mg/dL (ref 0–149)
VLDL Cholesterol Cal: 13 mg/dL (ref 5–40)

## 2023-08-21 LAB — TSH: TSH: 2.07 u[IU]/mL (ref 0.450–4.500)

## 2023-08-24 NOTE — Telephone Encounter (Signed)
Copied from CRM [id number]. Topic: Clinical - Lab/Test Results >> Aug 24, 2023  3:16 PM Chantha C wrote: Reason for CRM: Pt would like to speak to the nurse to explain the labs results, pls c/b [phone]

## 2023-09-03 ENCOUNTER — Other Ambulatory Visit: Payer: Self-pay | Admitting: Family Medicine

## 2023-09-10 ENCOUNTER — Encounter (INDEPENDENT_AMBULATORY_CARE_PROVIDER_SITE_OTHER): Payer: Self-pay | Admitting: *Deleted

## 2023-09-19 ENCOUNTER — Telehealth: Payer: Self-pay

## 2023-09-19 NOTE — Telephone Encounter (Signed)
 Copied from CRM [id number]. Topic: Clinical - Medical Advice >> Sep 19, 2023  8:34 AM Rebecca Miles B wrote: Reason for CRM: Patient has been experiencing pain in Left calf x2 days, would like to be seen today, no acute available

## 2023-09-19 NOTE — Telephone Encounter (Signed)
 Per Dr. Louanne Skye pt can wait for an appt tomorrow unless she is in severe pain.  Per pt she is not in severe pain. Site is not red or hot to touch.  [facility] does not have any openings tomorrow. Scheduled with DOD at 8:50. Pt aware.

## 2023-09-20 ENCOUNTER — Ambulatory Visit: Payer: Medicare PPO | Admitting: Family Medicine

## 2023-09-20 ENCOUNTER — Ambulatory Visit (HOSPITAL_COMMUNITY)
Admission: RE | Admit: 2023-09-20 | Discharge: 2023-09-20 | Disposition: A | Discharge status: Home / Self Care | Payer: No Typology Code available for payment source | Source: Ambulatory Visit | Attending: Family Medicine | Admitting: Family Medicine

## 2023-09-20 ENCOUNTER — Encounter: Payer: Self-pay | Admitting: Family Medicine

## 2023-09-20 ENCOUNTER — Ambulatory Visit (INDEPENDENT_AMBULATORY_CARE_PROVIDER_SITE_OTHER): Payer: No Typology Code available for payment source | Admitting: Family Medicine

## 2023-09-20 VITALS — BP 134/85 | HR 86 | Temp 97.8°F | Ht 59.0 in | Wt 226.0 lb

## 2023-09-20 DIAGNOSIS — M79662 Pain in left lower leg: Secondary | ICD-10-CM | POA: Insufficient documentation

## 2023-09-20 DIAGNOSIS — M7989 Other specified soft tissue disorders: Secondary | ICD-10-CM | POA: Diagnosis not present

## 2023-09-20 NOTE — Progress Notes (Signed)
   Acute Office Visit  Subjective:     Patient ID: Rebecca Miles, female    DOB: [birth year], 58 y.o.   MRN: [id number]  Chief Complaint  Patient presents with   Leg Pain    Leg Pain  There was no injury mechanism. Pain location: leg posterior calf. The quality of the pain is described as stabbing and aching (tender). The pain is moderate. The pain has been Constant since onset. Pertinent negatives include no inability to bear weight, loss of sensation, muscle weakness, numbness or tingling. She reports no foreign bodies present. The symptoms are aggravated by palpation and weight bearing. She has tried rest and elevation for the symptoms. The treatment provided no relief.   Pain started 4 days ago. Left calf is mildly swollen. No erythema or warmth. Concerned about possible DVT. Brother died from DVT. No personal hx of cancer, immobility, PE or DVT.   Review of Systems  Neurological:  Negative for tingling and numbness.        Objective:    BP 134/85   Pulse 86   Temp 97.8 F (36.6 C) (Temporal)   Ht 4' 11 (1.499 m)   Wt 226 lb (102.5 kg)   LMP 10/06/2013   SpO2 99%   BMI 45.65 kg/m    Physical Exam Vitals and nursing note reviewed.  Constitutional:      General: She is not in acute distress.    Appearance: She is obese. She is not ill-appearing, toxic-appearing or diaphoretic.  Pulmonary:     Effort: Pulmonary effort is normal. No respiratory distress.  Musculoskeletal:     Comments: Mild swelling to left calf, non pitting. Left calf girth of 50 cm. No redness or erythema. Mild tenderness to posterior lower leg.   Right calf girth of 48 cm. No swelling, tenderness, warmth, or erythema.   Skin:    General: Skin is warm and dry.  Neurological:     Mental Status: She is alert and oriented to person, place, and time. Mental status is at baseline.     Gait: Gait abnormal (using cane).  Psychiatric:        Mood and Affect: Mood normal.        Behavior: Behavior  normal.     No results found for any visits on 09/20/23.      Assessment & Plan:   Rebecca Miles was seen today for leg pain.  Diagnoses and all orders for this visit:  Pain of left calf -     US  Venous Img Lower Unilateral Left (DVT); Future  Pain and swelling of left lower leg -     US  Venous Img Lower Unilateral Left (DVT); Future   US  discussed and ordered to r/o DVT. Will notify patient of results when available and plan of care pending results.   The patient indicates understanding of these issues and agrees with the plan.  Rebecca CHRISTELLA Search, FNP

## 2024-01-10 ENCOUNTER — Encounter: Payer: Self-pay | Admitting: Nurse Practitioner

## 2024-01-10 ENCOUNTER — Ambulatory Visit: Admitting: Nurse Practitioner

## 2024-01-10 VITALS — BP 143/88 | HR 95 | Temp 97.2°F | Ht 59.0 in | Wt 222.0 lb

## 2024-01-10 DIAGNOSIS — M25561 Pain in right knee: Secondary | ICD-10-CM

## 2024-01-10 DIAGNOSIS — M25562 Pain in left knee: Secondary | ICD-10-CM

## 2024-01-10 DIAGNOSIS — G8929 Other chronic pain: Secondary | ICD-10-CM

## 2024-01-10 MED ORDER — METHYLPREDNISOLONE ACETATE 40 MG/ML IJ SUSP
40.0000 mg | Freq: Once | INTRAMUSCULAR | Status: AC
  Administered 2024-01-10: 40 mg via INTRAMUSCULAR

## 2024-01-10 MED ORDER — BUPIVACAINE HCL 0.25 % IJ SOLN
1.0000 mL | Freq: Once | INTRAMUSCULAR | Status: AC
  Administered 2024-01-10: 1 mL via INTRA_ARTICULAR

## 2024-01-10 NOTE — Progress Notes (Signed)
 Subjective:    Patient ID: Rebecca Miles, female    DOB: [birth year], 58 y.o.   MRN: [id number]   Chief Complaint: Knee Pain (Wants injections in both knees/)   Knee Pain    Paient come sin c/o bil knee pain. She has had for several years and comes in for steroid injections. Wants to repeat steroid injections today. Last injections were done 07/20/23. Patient Active Problem List   Diagnosis Date Noted   History of diverticulitis 06/19/2019   Clinical rickets 12/31/2017   Overactive bladder 12/31/2017   Walking difficulty due to joint disorder involving multiple sites 12/31/2017   Bilateral hip joint arthritis 12/31/2017   Hypothyroidism 05/21/2017   Hypertension 05/21/2017   Prediabetes 05/21/2017   OA (osteoarthritis) of knee 09/26/2012       Review of Systems  Constitutional:  Negative for diaphoresis.  Eyes:  Negative for pain.  Respiratory:  Negative for shortness of breath.   Cardiovascular:  Negative for chest pain, palpitations and leg swelling.  Gastrointestinal:  Negative for abdominal pain.  Endocrine: Negative for polydipsia.  Musculoskeletal:  Positive for arthralgias (bil knees).  Skin:  Negative for rash.  Neurological:  Negative for dizziness, weakness and headaches.  Hematological:  Does not bruise/bleed easily.  All other systems reviewed and are negative.      Objective:   Physical Exam Constitutional:      Appearance: Normal appearance.  Cardiovascular:     Rate and Rhythm: Normal rate and regular rhythm.     Heart sounds: Normal heart sounds.  Pulmonary:     Effort: Pulmonary effort is normal.     Breath sounds: Normal breath sounds.  Musculoskeletal:     Comments: Right knee-mild effusion- FROM Left  knee- mild effusion with crepitus on flexion and extension  Skin:    General: Skin is warm.  Neurological:     General: No focal deficit present.     Mental Status: She is alert and oriented to person, place, and time.  Psychiatric:         Mood and Affect: Mood normal.        Behavior: Behavior normal.    BP (!) 143/88   Pulse 95   Temp (!) 97.2 F (36.2 C) (Temporal)   Ht 4\' 11"  (1.499 m)   Wt 222 lb (100.7 kg)   LMP 10/06/2013   SpO2 98%   BMI 44.84 kg/m     Joint Injection/Arthrocentesis  Date/Time: 01/10/2024 2:41 PM  Performed by: Delfina Feller, FNP Authorized by: Gaylyn Keas Mary-Margaret, FNP  Indications: joint swelling and pain  Body area: knee Joint: right knee Local anesthesia used: no  Anesthesia: Local anesthesia used: no  Sedation: Patient sedated: no  Needle size: 22 G Ultrasound guidance: no Approach: inferior Methylprednisolone  amount: 40 mg Bupivacaine  0.25% amount: 1 mL Patient tolerance: patient tolerated the procedure well with no immediate complications   Joint Injection/Arthrocentesis  Date/Time: 01/10/2024 2:41 PM  Performed by: Delfina Feller, FNP Authorized by: Gaylyn Keas, Mary-Margaret, FNP  Indications: joint swelling  Body area: knee Joint: left knee Local anesthesia used: no  Anesthesia: Local anesthesia used: no  Sedation: Patient sedated: no  Needle size: 22 G Ultrasound guidance: no Approach: inferior Methylprednisolone  amount: 40 mg Bupivacaine  0.25% amount: 1 mL Patient tolerance: patient tolerated the procedure well with no immediate complications    BP (!) 143/88   Pulse 95   Temp (!) 97.2 F (36.2 C) (Temporal)   Ht 4\' 11"  (1.499 m)   Wt  222 lb (100.7 kg)   LMP 10/06/2013   SpO2 98%   BMI 44.84 kg/m        Assessment & Plan:  Julie-Anne A Brittian in today with chief complaint of Knee Pain (Wants injections in both knees/)   1. Chronic pain of left knee (Primary) Knee injection Ice BID - methylPREDNISolone  acetate (DEPO-MEDROL ) injection 40 mg - bupivacaine  (MARCAINE ) 0.25 % (with pres) injection 1 mL  2. Chronic pain of right knee Knee injection Ice BID - methylPREDNISolone  acetate (DEPO-MEDROL ) injection 40 mg -  bupivacaine  (MARCAINE ) 0.25 % (with pres) injection 1 mL    The above assessment and management plan was discussed with the patient. The patient verbalized understanding of and has agreed to the management plan. Patient is aware to call the clinic if symptoms persist or worsen. Patient is aware when to return to the clinic for a follow-up visit. Patient educated on when it is appropriate to go to the emergency department.   Mary-Margaret Gaylyn Keas, FNP

## 2024-01-10 NOTE — Patient Instructions (Signed)
 Knee Injection A knee injection is a procedure to get medicine in your knee joint to help relieve the pain, swelling, and stiffness that is caused by arthritis. The medicine may also cushion your knee joint. Your health care provider will use a needle to inject the medicine. You may need more than one injection. Tell a health care provider about: Any allergies you have. All medicines you take. These include vitamins, herbs, eye drops, and creams. Any problems you or family members have had with anesthesia. Any bleeding problems you have. Any surgeries you've had. Any medical conditions you have. Whether you're pregnant or may be pregnant. What are the risks? Your provider will talk with you about risks. These may include: Infection. Bleeding. Symptoms that get worse. Damage to the area around your knee. Allergic reaction to the medicines. Skin reactions from having many injections. What happens before the procedure? Ask about changing or stopping: Any medicines you take. Any vitamins, herbs, or supplements you take. Do not take aspirin or ibuprofen unless you're told to. Plan to have a responsible adult drive you home from the hospital or clinic. You won't be allowed to drive. What happens during the procedure?  You will sit or lie down in a position for your knee to be treated. The skin over your kneecap will be cleaned with a soap that kills germs. You will be given a medicine that numbs the area. You may feel some stinging. The medicine will be injected into your knee. The needle is placed between your kneecap and your knee. The medicine is injected into the joint space. The needle will be taken out. A bandage may be placed over the injection site. The procedure may vary among providers and hospitals. What can I expect after the procedure? Your blood pressure, heart rate, breathing rate, and blood oxygen level will be monitored until you leave the hospital or clinic. You may  have to move your knee through its full range of motion. This helps to get the medicine into the joint space. You will be watched to make sure that you do not have a reaction to the injection. You may feel more pain, swelling, and warmth than you did before the injection. This reaction may last about 1-2 days. Follow these instructions at home: Medicines Take over-the-counter and prescription medicines only as told by your provider. Ask your provider if the medicine prescribed to you requires you to avoid driving or using machinery. Do not take medicines such as aspirin and ibuprofen unless your provider tells you to. Injection site care Follow instructions from your provider about: How to take care of your injection site. When and how you should change your bandage. When you should take off your bandage. Check your injection area every day for signs of infection. Check for: More redness, swelling, or pain after 2 days. Fluid or blood. Warmth. Pus or a bad smell. Managing pain, stiffness, and swelling  Use ice or an ice pack as told. Place a towel between your skin and the ice. Leave the ice on for 20 minutes, 2-3 times a day. If your skin turns red, take off the ice right away to prevent skin damage. The risk of damage is higher if you can't feel pain, heat, or cold. Do not put heat to your knee. Raise your knee above the level of your heart while you're sitting or lying down. General instructions If you have a bandage, keep it dry until your provider says it can be taken  off. Ask your provider when you can start taking showers and baths. Avoid activities that take a lot of effort for as long as told by your provider. Ask what things are safe for you to do at home. Ask when you can go back to work or school. Keep all follow-up visits. You may need more injections. Contact a health care provider if: You have a fever. You have any signs of infection. Your symptoms at the injection  site last longer than 2 days after your procedure. Get help right away if: Your knee turns very red. Your knee becomes very swollen. You have very bad knee pain. This information is not intended to replace advice given to you by your health care provider. Make sure you discuss any questions you have with your health care provider. Document Revised: 12/26/2022 Document Reviewed: 12/26/2022 Elsevier Patient Education  2024 [employer].

## 2024-02-15 DIAGNOSIS — M1990 Unspecified osteoarthritis, unspecified site: Secondary | ICD-10-CM | POA: Diagnosis not present

## 2024-02-15 DIAGNOSIS — Z008 Encounter for other general examination: Secondary | ICD-10-CM | POA: Diagnosis not present

## 2024-02-15 DIAGNOSIS — M810 Age-related osteoporosis without current pathological fracture: Secondary | ICD-10-CM | POA: Diagnosis not present

## 2024-02-15 DIAGNOSIS — R2681 Unsteadiness on feet: Secondary | ICD-10-CM | POA: Diagnosis not present

## 2024-02-15 DIAGNOSIS — E039 Hypothyroidism, unspecified: Secondary | ICD-10-CM | POA: Diagnosis not present

## 2024-02-15 DIAGNOSIS — F17211 Nicotine dependence, cigarettes, in remission: Secondary | ICD-10-CM | POA: Diagnosis not present

## 2024-02-15 DIAGNOSIS — Z6841 Body Mass Index (BMI) 40.0 and over, adult: Secondary | ICD-10-CM | POA: Diagnosis not present

## 2024-02-15 DIAGNOSIS — I10 Essential (primary) hypertension: Secondary | ICD-10-CM | POA: Diagnosis not present

## 2024-02-15 DIAGNOSIS — E785 Hyperlipidemia, unspecified: Secondary | ICD-10-CM | POA: Diagnosis not present

## 2024-03-05 ENCOUNTER — Ambulatory Visit: Payer: Medicare PPO

## 2024-03-05 VITALS — BP 143/88 | HR 88 | Ht 59.0 in | Wt 222.0 lb

## 2024-03-05 DIAGNOSIS — Z1231 Encounter for screening mammogram for malignant neoplasm of breast: Secondary | ICD-10-CM | POA: Diagnosis not present

## 2024-03-05 DIAGNOSIS — Z0000 Encounter for general adult medical examination without abnormal findings: Secondary | ICD-10-CM

## 2024-03-05 DIAGNOSIS — Z1211 Encounter for screening for malignant neoplasm of colon: Secondary | ICD-10-CM

## 2024-03-05 NOTE — Progress Notes (Signed)
 Subjective:   Rebecca Miles is a 58 y.o. who presents for a Medicare Wellness preventive visit.  As a reminder, Annual Wellness Visits don't include a physical exam, and some assessments may be limited, especially if this visit is performed virtually. We may recommend an in-person follow-up visit with your provider if needed.  Visit Complete: Virtual I connected with  Rebecca Miles on 03/05/24 by a audio enabled telemedicine application and verified that I am speaking with the correct person using two identifiers.  Patient Location: Home  Provider Location: Home Office  I discussed the limitations of evaluation and management by telemedicine. The patient expressed understanding and agreed to proceed.  Vital Signs: Because this visit was a virtual/telehealth visit, some criteria may be missing or patient reported. Any vitals not documented were not able to be obtained and vitals that have been documented are patient reported.  VideoDeclined- This patient declined Librarian, academic. Therefore the visit was completed with audio only.  Persons Participating in Visit: Patient.  AWV Questionnaire: No: Patient Medicare AWV questionnaire was not completed prior to this visit.  Cardiac Risk Factors include: advanced age (>74men, >80 women);hypertension;obesity (BMI >30kg/m2)     Objective:    Today's Vitals   03/05/24 1003  BP: (!) 143/88  Pulse: 88  Weight: 222 lb (100.7 kg)  Height: 4' 11 (1.499 m)   Body mass index is 44.84 kg/m.     03/05/2024    9:36 AM 03/05/2023    9:51 AM 03/02/2022    9:50 AM 03/01/2021    1:30 PM 07/15/2019    7:35 AM 05/29/2018    8:34 AM 10/25/2017    3:49 PM  Advanced Directives  Does Patient Have a Medical Advance Directive? No No No No No No  No   Would patient like information on creating a medical advance directive?  No - Patient declined No - Patient declined Yes (MAU/Ambulatory/Procedural Areas -  Information given) No - Patient declined  No - Patient declined      Data saved with a previous flowsheet row definition    Current Medications (verified) Outpatient Encounter Medications as of 03/05/2024  Medication Sig   acetaminophen  (TYLENOL ) 650 MG CR tablet Take 1,300 mg by mouth every 8 (eight) hours as needed for pain.   fluticasone  (FLONASE ) 50 MCG/ACT nasal spray Place 2 sprays into both nostrils daily.   K Phos  Mono-Sod Phos Di & Mono (PHOSPHA 250 NEUTRAL) [id number] MG TABS TAKE 1 TABLET (250 MG TOTAL) BY MOUTH 4 (FOUR) TIMES DAILY - AFTER MEALS AND AT BEDTIME.   ketorolac  (TORADOL ) 10 MG tablet Take 10 mg by mouth every 6 (six) hours as needed.   levothyroxine  (SYNTHROID ) 125 MCG tablet Take 1 tablet (125 mcg total) by mouth daily before breakfast.   Magnesium 250 MG TABS Take 250 mg by mouth daily.   Multiple Vitamin (MULTIVITAMIN WITH MINERALS) TABS tablet Take 1 tablet by mouth daily.   olmesartan  (BENICAR ) 20 MG tablet Take 1 tablet (20 mg total) by mouth daily.   pravastatin  (PRAVACHOL ) 20 MG tablet Take 1 tablet (20 mg total) by mouth daily.   traMADol  (ULTRAM ) 50 MG tablet Take 1 tablet (50 mg total) by mouth every 6 (six) hours as needed for moderate pain (pain score 4-6).   triamcinolone  cream (KENALOG ) 0.1 % APPLY TO AFFECTED AREA TWICE A DAY   polyethylene glycol powder (GLYCOLAX /MIRALAX ) 17 GM/SCOOP powder Take 17 g by mouth daily. (Patient not taking: Reported on  03/05/2024)   Turmeric (QC TUMERIC COMPLEX PO) Take 1 tablet by mouth daily. (Patient not taking: Reported on 03/05/2024)   No facility-administered encounter medications on file as of 03/05/2024.    Allergies (verified) Patient has no known allergies.   History: Past Medical History:  Diagnosis Date   Allergy    Arthritis    Hypertension    Hypothyroidism    Osteoarthritis    ankles and joints   Rickets    Past Surgical History:  Procedure Laterality Date   CESAREAN SECTION     COLONOSCOPY  N/A 07/15/2019   Diverticulosis in cecum, three 4-6 mm polyps s/p removal. Tubular adenomas, sessile serrated polyps without dysplasia. 3 year surveillance.    FEMUR IM NAIL  09/26/2012   Procedure: INTRAMEDULLARY (IM) RETROGRADE FEMORAL NAILING;  Surgeon: Dempsey LULLA Moan, MD;  Location: [facility];  Service: Orthopedics;  Laterality: Right;   LEG SURGERY     bilateral at age 8    POLYPECTOMY  07/15/2019   Procedure: POLYPECTOMY;  Surgeon: Shaaron Lamar HERO, MD;  Location: [facility];  Service: Endoscopy;;  cecal    TIBIA OSTEOTOMY  09/26/2012   Procedure: TIBIAL OSTEOTOMY;  Surgeon: Dempsey LULLA Moan, MD;  Location: [facility];  Service: Orthopedics;  Laterality: Right;  RIGHT FEMORAL CORRECTIVE OSTEOMITY WITH RETROGRADE IM FEMORAL NAIL   TUBAL LIGATION     TUBAL LIGATION     Family History  Problem Relation Age of Onset   Diabetes Mother    Hypertension Mother    Kidney disease Mother    Parkinson's disease Father    Lupus Sister    Arthritis Maternal Grandmother    Hypertension Maternal Grandmother    Kidney disease Maternal Grandmother    Drug abuse Brother    Early death Brother    Stroke Brother    Graves' disease Son    Myasthenia gravis Son    Rickets Son    Rickets Son    Rickets Son    ADD / ADHD Son    Colon cancer Neg Hx    Colon polyps Neg Hx    Breast cancer Neg Hx    Social History   Socioeconomic History   Marital status: Married    Spouse name: Wadie   Number of children: 4   Years of education: 13   Highest education level: Some college, no degree  Occupational History   Occupation: disabled  Tobacco Use   Smoking status: Former    Current packs/day: 0.00    Average packs/day: 0.5 packs/day for 20.0 years (10.0 ttl pk-yrs)    Types: Cigarettes    Start date: 09/12/1979    Quit date: 09/12/1999    Years since quitting: 24.4   Smokeless tobacco: Never   Tobacco comments:    No current tobacco use  Vaping Use   Vaping status: Never Used  Substance and Sexual  Activity   Alcohol use: Yes    Comment: occasional   Drug use: No   Sexual activity: Not Currently  Other Topics Concern   Not on file  Social History Narrative   Married; All 4 of her children and 2 grandchildren live with her.   Social Drivers of Corporate investment banker Strain: Low Risk  (03/05/2024)   Overall Financial Resource Strain (CARDIA)    Difficulty of Paying Living Expenses: Not hard at all  Food Insecurity: No Food Insecurity (03/05/2024)   Hunger Vital Sign    Worried About Running Out of  Food in the Last Year: Never true    Ran Out of Food in the Last Year: Never true  Transportation Needs: No Transportation Needs (03/05/2024)   PRAPARE - Administrator, Civil Service (Medical): No    Lack of Transportation (Non-Medical): No  Physical Activity: Insufficiently Active (03/05/2024)   Exercise Vital Sign    Days of Exercise per Week: 3 days    Minutes of Exercise per Session: 30 min  Stress: No Stress Concern Present (03/05/2024)   [employer] of Occupational Health - Occupational Stress Questionnaire    Feeling of Stress: Not at all  Social Connections: Moderately Integrated (03/05/2024)   Social Connection and Isolation Panel    Frequency of Communication with Friends and Family: Once a week    Frequency of Social Gatherings with Friends and Family: Once a week    Attends Religious Services: More than 4 times per year    Active Member of [employer] or Organizations: Yes    Attends Engineer, structural: More than 4 times per year    Marital Status: Married    Tobacco Counseling Counseling given: Not Answered Tobacco comments: No current tobacco use    Clinical Intake:  Pre-visit preparation completed: Yes  Pain : No/denies pain (per pt currently no pain)     Nutritional Risks: None Diabetes: No  Lab Results  Component Value Date   HGBA1C 5.2 08/20/2023   HGBA1C 5.4 02/15/2023   HGBA1C 5.1 05/04/2022     How often do you  need to have someone help you when you read instructions, pamphlets, or other written materials from your doctor or pharmacy?: 1 - Never  Interpreter Needed?: No  Information entered by :: alia t/cma   Activities of Daily Living     03/05/2024    9:31 AM  In your present state of health, do you have any difficulty performing the following activities:  Hearing? 0  Vision? 0  Difficulty concentrating or making decisions? 1  Comment remembering things  Walking or climbing stairs? 1  Dressing or bathing? 0  Doing errands, shopping? 0  Preparing Food and eating ? N  Using the Toilet? N  In the past six months, have you accidently leaked urine? Y  Do you have problems with loss of bowel control? Y  Managing your Medications? N  Managing your Finances? N  Housekeeping or managing your Housekeeping? N    Patient Care Team: Dettinger, Fonda LABOR, MD as PCP - General (Family Medicine) Shaaron Lamar HERO, MD as Consulting Physician (Gastroenterology) Melodi Lerner, MD as Consulting Physician (Orthopedic Surgery)  I have updated your Care Teams any recent Medical Services you may have received from other providers in the past year.     Assessment:   This is a routine wellness examination for Rebecca Miles.  Hearing/Vision screen Hearing Screening - Comments:: Pt denies hearing dif Vision Screening - Comments:: Pt wear glasses denies vision dif/pt goes to [facility] [address] in [place]/last apt 6/25   Goals Addressed             This Visit's Progress    Patient Stated   On track    She would like to lose weight, is starting water  aerobics at [facility], wants to control sugar before she gets diabetes (recently dx as pre-diabetic)       Depression Screen     03/05/2024    9:38 AM 01/10/2024    2:32 PM 09/20/2023   10:10 AM 08/20/2023  9:47 AM 07/20/2023    9:51 AM 06/13/2023    4:01 PM 03/05/2023    9:50 AM  PHQ 2/9 Scores  PHQ - 2 Score 0 1 1 0 0 0 0  PHQ- 9 Score 1  2 2 2 5      Fall Risk      03/05/2024    9:26 AM 01/10/2024    2:31 PM 09/20/2023   10:10 AM 08/20/2023    9:47 AM 07/20/2023    9:51 AM  Fall Risk   Falls in the past year? 1 1 1 1 1   Number falls in past yr: 1 1 1 1 1   Injury with Fall? 0 0 1 1 1   Risk for fall due to : Impaired balance/gait;Impaired mobility History of fall(s) History of fall(s) Impaired balance/gait History of fall(s)  Follow up Falls evaluation completed Education provided Falls evaluation completed Falls evaluation completed Education provided    MEDICARE RISK AT HOME:  Medicare Risk at Home Any stairs in or around the home?: Yes If so, are there any without handrails?: Yes Home free of loose throw rugs in walkways, pet beds, electrical cords, etc?: Yes Adequate lighting in your home to reduce risk of falls?: Yes Life alert?: No Use of a cane, walker or w/c?: Yes (cane) Grab bars in the bathroom?: No Shower chair or bench in shower?: Yes Elevated toilet seat or a handicapped toilet?: Yes  TIMED UP AND GO:  Was the test performed?  no  Cognitive Function: 6CIT completed    10/25/2017    3:55 PM  MMSE - Mini Mental State Exam  Orientation to time 5   Orientation to Place 5   Registration 3   Attention/ Calculation 5   Recall 3   Language- name 2 objects 2   Language- repeat 1  Language- follow 3 step command 3   Language- read & follow direction 1   Write a sentence 1   Copy design 1   Total score 30      Data saved with a previous flowsheet row definition        03/05/2024    9:40 AM 03/05/2023    9:51 AM 03/02/2022    9:53 AM 03/01/2021    1:32 PM  6CIT Screen  What Year? 0 points 0 points 0 points 0 points  What month? 0 points 0 points 0 points 0 points  What time? 0 points 0 points 0 points 0 points  Count back from 20 0 points 0 points 0 points 0 points  Months in reverse 0 points 0 points 2 points 2 points  Repeat phrase 0 points 0 points 0 points 4 points  Total Score 0 points 0 points 2 points 6 points     Immunizations Immunization History  Administered Date(s) Administered   DTaP 05/07/1969, 06/18/1969, 09/17/1969, 06/17/1970, 06/16/1971   Hepatitis B 03/14/1999, 04/15/1999, 04/29/2001   IPV 06/17/1970, 06/16/1971, 06/26/1974   Influenza, Quadrivalent, Recombinant, Inj, Pf 06/19/2019   Influenza, Seasonal, Injecte, Preservative Fre 07/20/2023   Influenza,inj,Quad PF,6+ Mos 06/22/2017, 06/19/2018, 07/08/2020, 06/20/2021, 05/26/2022   Influenza-Unspecified 06/19/2019   Moderna Sars-Covid-2 Vaccination 11/04/2019, 11/25/2019   Rubella 11/05/1969   Td 04/30/1996   Tdap 04/11/2008, 05/11/2021    Screening Tests Health Maintenance  Topic Date Due   Zoster Vaccines- Shingrix (1 of 2) Never done   Colonoscopy  07/14/2022   MAMMOGRAM  12/06/2022   Cervical Cancer Screening (HPV/Pap Cotest)  12/02/2023   COVID-19 Vaccine (3 - Moderna  risk series) 03/21/2025 (Originally 12/23/2019)   INFLUENZA VACCINE  04/11/2024   Medicare Annual Wellness (AWV)  03/05/2025   DTaP/Tdap/Td (8 - Td or Tdap) 05/12/2031   Hepatitis B Vaccines  Completed   HPV VACCINES  Aged Out   Meningococcal B Vaccine  Aged Out   Hepatitis C Screening  Discontinued   HIV Screening  Discontinued    Health Maintenance  Health Maintenance Due  Topic Date Due   Zoster Vaccines- Shingrix (1 of 2) Never done   Colonoscopy  07/14/2022   MAMMOGRAM  12/06/2022   Cervical Cancer Screening (HPV/Pap Cotest)  12/02/2023   Health Maintenance Items Addressed: See Nurse Notes at the end of this note  Additional Screening:  Vision Screening: Recommended annual ophthalmology exams for early detection of glaucoma and other disorders of the eye. Would you like a referral to an eye doctor? No    Dental Screening: Recommended annual dental exams for proper oral hygiene  Community Resource Referral / Chronic Care Management: CRR required this visit?  No   CCM required this visit?  No   Plan:    I have personally  reviewed and noted the following in the patient's chart:   Medical and social history Use of alcohol, tobacco or illicit drugs  Current medications and supplements including opioid prescriptions. Patient is not currently taking opioid prescriptions. Functional ability and status Nutritional status Physical activity Advanced directives List of other physicians Hospitalizations, surgeries, and ER visits in previous 12 months Vitals Screenings to include cognitive, depression, and falls Referrals and appointments  In addition, I have reviewed and discussed with patient certain preventive protocols, quality metrics, and best practice recommendations. A written personalized care plan for preventive services as well as general preventive health recommendations were provided to patient.   Ozie Ned, CMA   03/05/2024   After Visit Summary: (MyChart) Due to this being a telephonic visit, the after visit summary with patients personalized plan was offered to patient via MyChart   Notes: Please refer to Routing Comments.

## 2024-03-05 NOTE — Patient Instructions (Addendum)
 Rebecca Miles , Thank you for taking time out of your busy schedule to complete your Annual Wellness Visit with me. I enjoyed our conversation and look forward to speaking with you again next year. I, as well as your care team,  appreciate your ongoing commitment to your health goals. Please review the following plan we discussed and let me know if I can assist you in the future. Your Game plan/ To Do List    Follow up Visits: Next Medicare AWV with our clinical staff: 03/06/24 at 9:20a.m.   Next Office Visit with your provider: 04/17/24 at 1:55p.m.  Clinician Recommendations:  Aim for 30 minutes of exercise or brisk walking, 6-8 glasses of water , and 5 servings of fruits and vegetables each day. Please remember to get the following done at your next visit: Colonoscopy, mammogram, Pap and shingles vaccine.       This is a list of the screening recommended for you and due dates:  Health Maintenance  Topic Date Due   Zoster (Shingles) Vaccine (1 of 2) Never done   Colon Cancer Screening  07/14/2022   Mammogram  12/06/2022   Pap with HPV screening  12/02/2023   COVID-19 Vaccine (3 - Moderna risk series) 03/21/2025*   Flu Shot  04/11/2024   Medicare Annual Wellness Visit  03/05/2025   DTaP/Tdap/Td vaccine (8 - Td or Tdap) 05/12/2031   Hepatitis B Vaccine  Completed   HPV Vaccine  Aged Out   Meningitis B Vaccine  Aged Out   Hepatitis C Screening  Discontinued   HIV Screening  Discontinued  *Topic was postponed. The date shown is not the original due date.    Advanced directives: (Declined) Advance directive discussed with you today. Even though you declined this today, please call our office should you change your mind, and we can give you the proper paperwork for you to fill out. Advance Care Planning is important because it:  [x]  Makes sure you receive the medical care that is consistent with your values, goals, and preferences  [x]  It provides guidance to your family and loved ones and  reduces their decisional burden about whether or not they are making the right decisions based on your wishes.  Follow the link provided in your after visit summary or read over the paperwork we have mailed to you to help you started getting your Advance Directives in place. If you need assistance in completing these, please reach out to us  so that we can help you!  See attachments for Preventive Care and Fall Prevention Tips.

## 2024-03-07 ENCOUNTER — Other Ambulatory Visit: Payer: Self-pay | Admitting: Family Medicine

## 2024-03-07 DIAGNOSIS — E550 Rickets, active: Secondary | ICD-10-CM

## 2024-03-24 ENCOUNTER — Encounter

## 2024-03-30 ENCOUNTER — Other Ambulatory Visit: Payer: Self-pay | Admitting: Family Medicine

## 2024-03-30 DIAGNOSIS — E550 Rickets, active: Secondary | ICD-10-CM

## 2024-03-31 ENCOUNTER — Telehealth: Payer: Self-pay | Admitting: Family Medicine

## 2024-03-31 DIAGNOSIS — M1712 Unilateral primary osteoarthritis, left knee: Secondary | ICD-10-CM

## 2024-03-31 DIAGNOSIS — E550 Rickets, active: Secondary | ICD-10-CM

## 2024-03-31 NOTE — Telephone Encounter (Signed)
 Explained to pt that this request ws for a controlled med that can not be refilled outside a visit. Was able to make an appt for this Th 04/03/24

## 2024-03-31 NOTE — Telephone Encounter (Unsigned)
 Copied from CRM [id number]. Topic: Referral - Request for Referral >> Mar 31, 2024  8:41 AM Farrel B wrote: Did the patient discuss referral with their provider in the last year? Yes   Appointment offered? No  Type of order/referral and detailed reason for visit: Patient states she's been having a lot of patient in her femur where the rods are located. She needs to see this provider as she stated he is the provider that done the femur surgery previously  Preference of office, provider, location:  Dempsey GAILS. Aluisio, MD [address] ([address] 2) [place], [place] [address]  If referral order, have you been seen by this specialty before? Yes (If Yes, this issue or another issue? When? Where?  Can we respond through MyChart? Yes

## 2024-03-31 NOTE — Telephone Encounter (Signed)
 Please review

## 2024-03-31 NOTE — Addendum Note (Signed)
 Addended by: LONZO GARRE A on: 03/31/2024 02:52 PM   Modules accepted: Orders

## 2024-03-31 NOTE — Telephone Encounter (Signed)
 Copied from CRM [id number]. Topic: Clinical - Medication Refill >> Mar 31, 2024  8:36 AM Suzette B wrote: Medication:  traMADol  (ULTRAM ) 50 MG tablet  Has the patient contacted their pharmacy? Yes; patient was advised to called her provider and request a new refill for this prescriptions   This is the patient's preferred pharmacy:  [id number] - [place], [place] - [address] [facility] [address] [place] [address] Phone: [phone] Fax: [phone]    Is this the correct pharmacy for this prescription? Yes If no, delete pharmacy and type the correct one.   Has the prescription been filled recently? Yes  Is the patient out of the medication? Yes  Has the patient been seen for an appointment in the last year OR does the patient have an upcoming appointment? Yes  Can we respond through MyChart? Yes  Agent: Please be advised that Rx refills may take up to 3 business days. We ask that you follow-up with your pharmacy.

## 2024-04-02 NOTE — Telephone Encounter (Signed)
 Referral placed.

## 2024-04-02 NOTE — Telephone Encounter (Signed)
 Yes go ahead and place referral to the orthopedic that she indicated.  Diagnosis rickets and osteoarthritis of the knees

## 2024-04-03 ENCOUNTER — Encounter (INDEPENDENT_AMBULATORY_CARE_PROVIDER_SITE_OTHER): Payer: Self-pay | Admitting: *Deleted

## 2024-04-03 ENCOUNTER — Encounter: Payer: Self-pay | Admitting: Family Medicine

## 2024-04-03 ENCOUNTER — Ambulatory Visit (INDEPENDENT_AMBULATORY_CARE_PROVIDER_SITE_OTHER): Admitting: Family Medicine

## 2024-04-03 VITALS — BP 130/88 | HR 83 | Ht 59.0 in | Wt 221.0 lb

## 2024-04-03 DIAGNOSIS — I10 Essential (primary) hypertension: Secondary | ICD-10-CM | POA: Diagnosis not present

## 2024-04-03 DIAGNOSIS — Z79899 Other long term (current) drug therapy: Secondary | ICD-10-CM | POA: Diagnosis not present

## 2024-04-03 DIAGNOSIS — E550 Rickets, active: Secondary | ICD-10-CM | POA: Diagnosis not present

## 2024-04-03 DIAGNOSIS — M839 Adult osteomalacia, unspecified: Secondary | ICD-10-CM | POA: Diagnosis not present

## 2024-04-03 DIAGNOSIS — E039 Hypothyroidism, unspecified: Secondary | ICD-10-CM

## 2024-04-03 DIAGNOSIS — R7303 Prediabetes: Secondary | ICD-10-CM

## 2024-04-03 LAB — BAYER DCA HB A1C WAIVED: HB A1C (BAYER DCA - WAIVED): 5.2 % (ref 4.8–5.6)

## 2024-04-03 LAB — LIPID PANEL

## 2024-04-03 MED ORDER — LEVOTHYROXINE SODIUM 125 MCG PO TABS: 125.0000 ug | ORAL_TABLET | Freq: Every day | ORAL | 2 refills | Status: AC

## 2024-04-03 MED ORDER — TRAMADOL HCL 50 MG PO TABS: 50.0000 mg | ORAL_TABLET | Freq: Four times a day (QID) | ORAL | 1 refills | Status: DC | PRN

## 2024-04-03 NOTE — Addendum Note (Signed)
 Addended by: LEIGH ROSINA SAILOR on: 04/03/2024 08:44 AM   Modules accepted: Orders

## 2024-04-03 NOTE — Progress Notes (Signed)
 BP 130/88   Pulse 83   Ht 4' 11 (1.499 m)   Wt 221 lb (100.2 kg)   LMP 10/06/2013   SpO2 98%   BMI 44.64 kg/m    Subjective:   Patient ID: Rebecca Miles, female    DOB: 02/25/1966, 58 y.o.   MRN: [id number]  HPI: Rebecca Miles is a 58 y.o. female presenting on 04/03/2024 for Medical Management of Chronic Issues and Tramadol  refill   HPI Hypothyroidism recheck Patient is coming in for thyroid  recheck today as well. They deny any issues with hair changes or heat or cold problems or diarrhea or constipation. They deny any chest pain or palpitations. They are currently on levothyroxine  125 micrograms   Hypertension Patient is currently on olmesartan , and their blood pressure today is 130/88. Patient denies any lightheadedness or dizziness. Patient denies headaches, blurred vision, chest pains, shortness of breath, or weakness. Denies any side effects from medication and is content with current medication.   Prediabetes Patient comes in today for recheck of his diabetes. Patient has been currently taking no medicine currently, diet control. Patient is currently on an ACE inhibitor/ARB. Patient has not seen an ophthalmologist this year. Patient denies any new issues with their feet. The symptom started onset as an adult hypertension and hypothyroidism ARE RELATED TO DM   Pain assessment: Cause of pain-bilateral hip and knee arthritis worsened by rickets Pain location-hips and knees Pain on scale of 1-10- 6 Frequency-most days, some days are worse What increases pain-being on her feet moving and weather changes What makes pain Better-medication and rest Effects on ADL -limits some of the ability to stand and cook and walk and do things around the house Any change in general medical condition-none  Current opioids rx-tramadol  50 mg every 6 hours as needed # meds rx-30/month, last for longer than a month currently Effectiveness of current meds-works well Adverse reactions  from pain meds-none Morphine  equivalent-20  Pill count performed-No Last drug screen -02/23/2023 ( high risk q24m, moderate risk q39m, low risk yearly ) Urine drug screen today- Yes Was the [facility] reviewed-yes  If yes were their any concerning findings? -None Pain contract signed on: Today  Relevant past medical, surgical, family and social history reviewed and updated as indicated. Interim medical history since our last visit reviewed. Allergies and medications reviewed and updated.  Review of Systems  Constitutional:  Negative for chills and fever.  Eyes:  Negative for visual disturbance.  Respiratory:  Negative for chest tightness and shortness of breath.   Cardiovascular:  Negative for chest pain and leg swelling.  Genitourinary:  Negative for difficulty urinating and dysuria.  Musculoskeletal:  Positive for arthralgias, gait problem and myalgias. Negative for back pain.  Skin:  Negative for rash.  Neurological:  Negative for light-headedness and headaches.  Psychiatric/Behavioral:  Negative for agitation and behavioral problems.   All other systems reviewed and are negative.   Per HPI unless specifically indicated above   Allergies as of 04/03/2024   No Known Allergies      Medication List        Accurate as of April 03, 2024  8:31 AM. If you have any questions, ask your nurse or doctor.          STOP taking these medications    ketorolac  10 MG tablet Commonly known as: TORADOL  Stopped by: Fonda DELENA Masen Salvas       TAKE these medications    acetaminophen  650 MG CR tablet Commonly known  as: TYLENOL  Take 1,300 mg by mouth every 8 (eight) hours as needed for pain.   fluticasone  50 MCG/ACT nasal spray Commonly known as: FLONASE  Place 2 sprays into both nostrils daily.   levothyroxine  125 MCG tablet Commonly known as: SYNTHROID  Take 1 tablet (125 mcg total) by mouth daily before breakfast.   Magnesium 250 MG Tabs Take 250 mg by mouth daily.    multivitamin with minerals Tabs tablet Take 1 tablet by mouth daily.   olmesartan  20 MG tablet Commonly known as: BENICAR  Take 1 tablet (20 mg total) by mouth daily.   Phospha 250 Neutral 155-852-130 MG Tabs TAKE 1 TABLET (250 MG TOTAL) BY MOUTH 4 (FOUR) TIMES DAILY - AFTER MEALS AND AT BEDTIME.   polyethylene glycol powder 17 GM/SCOOP powder Commonly known as: GLYCOLAX /MIRALAX  Take 17 g by mouth daily.   pravastatin  20 MG tablet Commonly known as: PRAVACHOL  Take 1 tablet (20 mg total) by mouth daily.   QC TUMERIC COMPLEX PO Take 1 tablet by mouth daily.   traMADol  50 MG tablet Commonly known as: ULTRAM  Take 1 tablet (50 mg total) by mouth every 6 (six) hours as needed for moderate pain (pain score 4-6).   triamcinolone  cream 0.1 % Commonly known as: KENALOG  APPLY TO AFFECTED AREA TWICE A DAY         Objective:   BP 130/88   Pulse 83   Ht 4' 11 (1.499 m)   Wt 221 lb (100.2 kg)   LMP 10/06/2013   SpO2 98%   BMI 44.64 kg/m   Wt Readings from Last 3 Encounters:  04/03/24 221 lb (100.2 kg)  03/05/24 222 lb (100.7 kg)  01/10/24 222 lb (100.7 kg)    Physical Exam Vitals and nursing note reviewed.  Constitutional:      General: She is not in acute distress.    Appearance: She is well-developed. She is not diaphoretic.  Eyes:     Conjunctiva/sclera: Conjunctivae normal.  Cardiovascular:     Rate and Rhythm: Normal rate and regular rhythm.     Heart sounds: Normal heart sounds. No murmur heard. Pulmonary:     Effort: Pulmonary effort is normal. No respiratory distress.     Breath sounds: Normal breath sounds. No wheezing.  Musculoskeletal:        General: Deformity (Patient has bilateral lower extremity bowed legs) present. No swelling.  Skin:    General: Skin is warm and dry.     Findings: No rash.  Neurological:     Mental Status: She is alert and oriented to person, place, and time.     Coordination: Coordination normal.  Psychiatric:         Behavior: Behavior normal.       Assessment & Plan:   Problem List Items Addressed This Visit       Cardiovascular and Mediastinum   Hypertension   Relevant Orders   CBC with Differential/Platelet   CMP14+EGFR     Endocrine   Hypothyroidism - Primary   Relevant Medications   levothyroxine  (SYNTHROID ) 125 MCG tablet   Other Relevant Orders   Lipid panel   TSH     Musculoskeletal and Integument   Clinical rickets   Relevant Medications   traMADol  (ULTRAM ) 50 MG tablet     Other   Prediabetes   Relevant Orders   CBC with Differential/Platelet   CMP14+EGFR   Lipid panel   Bayer DCA Hb A1c Waived    Will do blood work today, did refill her  medications, she seems to been doing well except for her legs and joints and we have already placed referral for her to go back to her orthopedic to be seen.  She is due for mammogram and colonoscopy and Pap smear and she is aware of this. Follow up plan: Return in about 3 months (around 07/04/2024), or if symptoms worsen or fail to improve, for Prediabetes.  Counseling provided for all of the vaccine components Orders Placed This Encounter  Procedures   CBC with Differential/Platelet   CMP14+EGFR   Lipid panel   TSH   Bayer DCA Hb A1c Waived    Fonda Levins, MD [facility] 04/03/2024, 8:31 AM

## 2024-04-03 NOTE — Patient Instructions (Signed)
 [facility] in [place]. [phone]

## 2024-04-04 ENCOUNTER — Telehealth: Payer: Self-pay | Admitting: Family Medicine

## 2024-04-04 LAB — CBC WITH DIFFERENTIAL/PLATELET
Basophils Absolute: 0.1 x10E3/uL (ref 0.0–0.2)
Basos: 1 %
EOS (ABSOLUTE): 0.1 x10E3/uL (ref 0.0–0.4)
Eos: 2 %
Hematocrit: 41.9 % (ref 34.0–46.6)
Hemoglobin: 13.7 g/dL (ref 11.1–15.9)
Immature Grans (Abs): 0 x10E3/uL (ref 0.0–0.1)
Immature Granulocytes: 0 %
Lymphocytes Absolute: 2.4 x10E3/uL (ref 0.7–3.1)
Lymphs: 28 %
MCH: 30.2 pg (ref 26.6–33.0)
MCHC: 32.7 g/dL (ref 31.5–35.7)
MCV: 93 fL (ref 79–97)
Monocytes Absolute: 0.7 x10E3/uL (ref 0.1–0.9)
Monocytes: 8 %
Neutrophils Absolute: 5.1 x10E3/uL (ref 1.4–7.0)
Neutrophils: 61 %
Platelets: 373 x10E3/uL (ref 150–450)
RBC: 4.53 x10E6/uL (ref 3.77–5.28)
RDW: 13.1 % (ref 11.7–15.4)
WBC: 8.4 x10E3/uL (ref 3.4–10.8)

## 2024-04-04 LAB — LIPID PANEL
Cholesterol, Total: 177 mg/dL (ref 100–199)
HDL: 48 mg/dL (ref >39)
LDL CALC COMMENT:: 3.7 ratio (ref 0.0–4.4)
LDL Chol Calc (NIH): 112 mg/dL — AB (ref 0–99)
Triglycerides: 90 mg/dL (ref 0–149)
VLDL Cholesterol Cal: 17 mg/dL (ref 5–40)

## 2024-04-04 LAB — CMP14+EGFR
ALT: 14 IU/L (ref 0–32)
AST: 18 IU/L (ref 0–40)
Albumin: 3.9 g/dL (ref 3.8–4.9)
Alkaline Phosphatase: 143 IU/L — AB (ref 44–121)
BUN/Creatinine Ratio: 10 (ref 9–23)
BUN: 6 mg/dL (ref 6–24)
Bilirubin Total: 0.4 mg/dL (ref 0.0–1.2)
CO2: 20 mmol/L (ref 20–29)
Calcium: 9.4 mg/dL (ref 8.7–10.2)
Chloride: 105 mmol/L (ref 96–106)
Creatinine, Ser: 0.61 mg/dL (ref 0.57–1.00)
Globulin, Total: 2.2 g/dL (ref 1.5–4.5)
Glucose: 95 mg/dL (ref 70–99)
Potassium: 4.2 mmol/L (ref 3.5–5.2)
Sodium: 139 mmol/L (ref 134–144)
Total Protein: 6.1 g/dL (ref 6.0–8.5)
eGFR: 105 mL/min/1.73 (ref >59)

## 2024-04-04 LAB — TSH: TSH: 1.22 u[IU]/mL (ref 0.450–4.500)

## 2024-04-04 NOTE — Telephone Encounter (Signed)
 Copied from CRM #[id number]. Topic: Clinical - Lab/Test Results >> Apr 04, 2024  8:20 AM Larissa RAMAN wrote: Reason for CRM: Patient has questions regarding lab results. No provider notes available at this time.

## 2024-04-04 NOTE — Telephone Encounter (Signed)
 Note resulted yet will call once provider reviews.

## 2024-04-07 LAB — TOXASSURE SELECT 13 (MW), URINE

## 2024-04-11 ENCOUNTER — Ambulatory Visit: Payer: Self-pay | Admitting: Family Medicine

## 2024-04-11 MED ORDER — PRAVASTATIN SODIUM 40 MG PO TABS: 40.0000 mg | ORAL_TABLET | Freq: Every day | ORAL | 3 refills | Status: AC

## 2024-04-17 ENCOUNTER — Encounter: Admitting: Family Medicine

## 2024-04-17 ENCOUNTER — Encounter: Payer: Self-pay | Admitting: Family Medicine

## 2024-04-21 ENCOUNTER — Inpatient Hospital Stay: Admission: RE | Admit: 2024-04-21 | Source: Ambulatory Visit

## 2024-05-07 ENCOUNTER — Inpatient Hospital Stay: Admission: RE | Admit: 2024-05-07 | Source: Ambulatory Visit

## 2024-05-22 ENCOUNTER — Other Ambulatory Visit (HOSPITAL_COMMUNITY)
Admission: RE | Admit: 2024-05-22 | Discharge: 2024-05-22 | Disposition: A | Discharge status: Home / Self Care | Source: Ambulatory Visit | Attending: Family Medicine | Admitting: Family Medicine

## 2024-05-22 ENCOUNTER — Encounter: Payer: Self-pay | Admitting: Family Medicine

## 2024-05-22 ENCOUNTER — Ambulatory Visit (INDEPENDENT_AMBULATORY_CARE_PROVIDER_SITE_OTHER): Admitting: Family Medicine

## 2024-05-22 VITALS — BP 129/79 | HR 82 | Temp 97.9°F | Ht 59.0 in | Wt 219.0 lb

## 2024-05-22 DIAGNOSIS — Z01419 Encounter for gynecological examination (general) (routine) without abnormal findings: Secondary | ICD-10-CM | POA: Diagnosis not present

## 2024-05-22 DIAGNOSIS — I10 Essential (primary) hypertension: Secondary | ICD-10-CM

## 2024-05-22 DIAGNOSIS — R7303 Prediabetes: Secondary | ICD-10-CM

## 2024-05-22 DIAGNOSIS — E039 Hypothyroidism, unspecified: Secondary | ICD-10-CM | POA: Diagnosis not present

## 2024-05-22 DIAGNOSIS — M174 Other bilateral secondary osteoarthritis of knee: Secondary | ICD-10-CM

## 2024-05-22 DIAGNOSIS — Z01411 Encounter for gynecological examination (general) (routine) with abnormal findings: Secondary | ICD-10-CM | POA: Diagnosis not present

## 2024-05-22 DIAGNOSIS — Z23 Encounter for immunization: Secondary | ICD-10-CM | POA: Diagnosis not present

## 2024-05-22 LAB — BAYER DCA HB A1C WAIVED: HB A1C (BAYER DCA - WAIVED): 5.4 % (ref 4.8–5.6)

## 2024-05-22 MED ORDER — METHYLPREDNISOLONE ACETATE 80 MG/ML IJ SUSP
80.0000 mg | Freq: Once | INTRAMUSCULAR | Status: AC
  Administered 2024-05-22: 80 mg via INTRA_ARTICULAR

## 2024-05-22 MED ORDER — OLMESARTAN MEDOXOMIL 20 MG PO TABS: 20.0000 mg | ORAL_TABLET | Freq: Every day | ORAL | 3 refills | Status: AC

## 2024-05-22 NOTE — Progress Notes (Signed)
 BP 129/79   Pulse 82   Temp 97.9 F (36.6 C)   Ht 4' 11 (1.499 m)   Wt 219 lb (99.3 kg)   LMP 10/06/2013   SpO2 96%   BMI 44.23 kg/m    Subjective:   Patient ID: Rebecca Miles, female    DOB: 02-12-1966, 58 y.o.   MRN: [id number]  HPI: Rebecca Miles is a 58 y.o. female presenting on 05/22/2024 for Medical Management of Chronic Issues (CPE)   Discussed the use of AI scribe software for clinical note transcription with the patient, who gave verbal consent to proceed.  History of Present Illness   Rebecca Miles is a 58 year old female with knee osteoarthritis who presents for a physical exam and knee injections.  She has ongoing issues with her knees, particularly the left knee, which has been diagnosed with severe arthritis. An orthopedic specialist confirmed the diagnosis with x-rays, noting that her left leg is worse than the right. She manages her knee pain with tramadol  as needed and is considering knee injections today.  She is on thyroid  medication and experiences hot flashes, particularly at night, which she attributes to both her thyroid  condition and postmenopausal status. She manages these symptoms by sleeping with a fan. Her thyroid  levels are to be checked during this visit.  Her blood pressure is well-controlled with olmesartan , and she occasionally checks her blood pressure at home. She is also taking pravastatin  for cholesterol management and reports doing well on this medication.  She mentions undergoing a detox regimen involving herbal teas, which she is using to improve her immune system. She is not following a vegan diet but is incorporating more vegetables into her meals.  She has a history of uterine prolapse, which causes bladder issues such as leakage, for which she wears pull-ups. She is considering surgical options to address this issue.  No new breast lumps or bumps, and her nipples have always been inverted.          Relevant past  medical, surgical, family and social history reviewed and updated as indicated. Interim medical history since our last visit reviewed. Allergies and medications reviewed and updated.  Review of Systems  Constitutional:  Negative for chills and fever.  HENT:  Negative for congestion, ear discharge and ear pain.   Eyes:  Negative for redness and visual disturbance.  Respiratory:  Negative for chest tightness and shortness of breath.   Cardiovascular:  Negative for chest pain and leg swelling.  Gastrointestinal:  Negative for abdominal pain, constipation and diarrhea.  Genitourinary:  Positive for urgency. Negative for difficulty urinating and dysuria.  Musculoskeletal:  Positive for arthralgias, gait problem and myalgias. Negative for back pain.  Skin:  Negative for rash.  Neurological:  Negative for light-headedness and headaches.  Psychiatric/Behavioral:  Negative for agitation and behavioral problems.   All other systems reviewed and are negative.   Per HPI unless specifically indicated above   Allergies as of 05/22/2024   No Known Allergies      Medication List        Accurate as of May 22, 2024  2:34 PM. If you have any questions, ask your nurse or doctor.          acetaminophen  650 MG CR tablet Commonly known as: TYLENOL  Take 1,300 mg by mouth every 8 (eight) hours as needed for pain.   fluticasone  50 MCG/ACT nasal spray Commonly known as: FLONASE  Place 2 sprays into both nostrils daily.  levothyroxine  125 MCG tablet Commonly known as: SYNTHROID  Take 1 tablet (125 mcg total) by mouth daily before breakfast.   Magnesium 250 MG Tabs Take 250 mg by mouth daily.   multivitamin with minerals Tabs tablet Take 1 tablet by mouth daily.   olmesartan  20 MG tablet Commonly known as: BENICAR  Take 1 tablet (20 mg total) by mouth daily.   Phospha 250 Neutral 155-852-130 MG Tabs TAKE 1 TABLET (250 MG TOTAL) BY MOUTH 4 (FOUR) TIMES DAILY - AFTER MEALS AND AT  BEDTIME.   polyethylene glycol powder 17 GM/SCOOP powder Commonly known as: GLYCOLAX /MIRALAX  Take 17 g by mouth daily.   pravastatin  40 MG tablet Commonly known as: PRAVACHOL  Take 1 tablet (40 mg total) by mouth daily.   QC TUMERIC COMPLEX PO Take 1 tablet by mouth daily.   traMADol  50 MG tablet Commonly known as: ULTRAM  Take 1 tablet (50 mg total) by mouth every 6 (six) hours as needed for moderate pain (pain score 4-6).   triamcinolone  cream 0.1 % Commonly known as: KENALOG  APPLY TO AFFECTED AREA TWICE A DAY         Objective:   BP 129/79   Pulse 82   Temp 97.9 F (36.6 C)   Ht 4' 11 (1.499 m)   Wt 219 lb (99.3 kg)   LMP 10/06/2013   SpO2 96%   BMI 44.23 kg/m   Wt Readings from Last 3 Encounters:  05/22/24 219 lb (99.3 kg)  04/03/24 221 lb (100.2 kg)  03/05/24 222 lb (100.7 kg)    Physical Exam Vitals and nursing note reviewed. Exam conducted with a chaperone present.  Constitutional:      Appearance: She is obese.  Genitourinary:    Exam position: Lithotomy position.     Vagina: Prolapsed vaginal walls present. No vaginal discharge, erythema or tenderness.     Cervix: Normal.  Neurological:     Mental Status: She is alert.    Physical Exam   VITALS: BP- 129/79 CHEST: Lungs clear to auscultation. CARDIOVASCULAR: Regular heart sounds, no murmurs. BREAST: No breast masses or abnormalities. ABDOMEN: Abdomen non-tender to palpation. GENITOURINARY: Uterine prolapse with cervix descending into vaginal area.       Knee bilateral injection: Consent form signed. Risk factors of bleeding and infection discussed with patient and patient is agreeable towards injection. Patient prepped with Betadine. Lateral approach towards injection used. Injected 80 mg of Depo-Medrol  and 1 mL of 2% lidocaine . Patient tolerated procedure well and no side effects from noted. Minimal to no bleeding. Simple bandage applied after.   Assessment & Plan:   Problem List Items  Addressed This Visit       Cardiovascular and Mediastinum   Hypertension   Relevant Medications   olmesartan  (BENICAR ) 20 MG tablet   Other Relevant Orders   CMP14+EGFR   Lipid panel     Endocrine   Hypothyroidism   Relevant Orders   TSH     Musculoskeletal and Integument   OA (osteoarthritis) of knee   Relevant Medications   methylPREDNISolone  acetate (DEPO-MEDROL ) injection 80 mg (Start on 05/22/2024  2:45 PM)   methylPREDNISolone  acetate (DEPO-MEDROL ) injection 80 mg (Start on 05/22/2024  2:45 PM)     Other   Prediabetes   Relevant Orders   CMP14+EGFR   Lipid panel   Bayer DCA Hb A1c Waived   Other Visit Diagnoses       Well woman exam with routine gynecological exam    -  Primary   Relevant Orders  Cytology - PAP([place])   CBC with Differential/Platelet   CMP14+EGFR   Lipid panel   TSH          Uterine prolapse with urinary incontinence Uterine prolapse with urinary incontinence. Interested in bladder sling surgery. - Provided information on bladder sling surgery. - Consider referral to urologist or gynecologist.  Bilateral knee osteoarthritis Severe bilateral knee osteoarthritis, left worse than right. Previous consultation indicated significant joint degeneration. Hesitant about surgery. - Administer 80 mg knee injections in both knees.  Well Woman Visit Routine visit. Discussed mammogram importance. Pap smear performed. - Perform mammogram. - Perform Pap smear.  Hypothyroidism Experiencing hot flashes, possibly related to thyroid  function or postmenopausal status. - Check thyroid  levels.  Essential hypertension Blood pressure well-controlled on olmesartan . - Continue olmesartan . - Monitor blood pressure at home monthly.  Hyperlipidemia Well-managed on pravastatin . - Check cholesterol levels.          Follow up plan: Return in about 6 months (around 11/19/2024), or if symptoms worsen or fail to improve, for Prediabetes and thyroid   recheck.  Counseling provided for all of the vaccine components Orders Placed This Encounter  Procedures   CBC with Differential/Platelet   CMP14+EGFR   Lipid panel   TSH   Bayer DCA Hb A1c Waived    Fonda Levins, MD [facility] 05/22/2024, 2:34 PM

## 2024-05-23 LAB — CBC WITH DIFFERENTIAL/PLATELET
Basophils Absolute: 0 x10E3/uL (ref 0.0–0.2)
Basos: 1 %
EOS (ABSOLUTE): 0.2 x10E3/uL (ref 0.0–0.4)
Eos: 2 %
Hematocrit: 43.7 % (ref 34.0–46.6)
Hemoglobin: 14.1 g/dL (ref 11.1–15.9)
Immature Grans (Abs): 0 x10E3/uL (ref 0.0–0.1)
Immature Granulocytes: 0 %
Lymphocytes Absolute: 2.9 x10E3/uL (ref 0.7–3.1)
Lymphs: 37 %
MCH: 30.2 pg (ref 26.6–33.0)
MCHC: 32.3 g/dL (ref 31.5–35.7)
MCV: 94 fL (ref 79–97)
Monocytes Absolute: 0.7 x10E3/uL (ref 0.1–0.9)
Monocytes: 9 %
Neutrophils Absolute: 4.1 x10E3/uL (ref 1.4–7.0)
Neutrophils: 51 %
Platelets: 386 x10E3/uL (ref 150–450)
RBC: 4.67 x10E6/uL (ref 3.77–5.28)
RDW: 12.3 % (ref 11.7–15.4)
WBC: 7.9 x10E3/uL (ref 3.4–10.8)

## 2024-05-23 LAB — CMP14+EGFR
ALT: 17 IU/L (ref 0–32)
AST: 22 IU/L (ref 0–40)
Albumin: 4.3 g/dL (ref 3.8–4.9)
Alkaline Phosphatase: 135 IU/L — ABNORMAL HIGH (ref 44–121)
BUN/Creatinine Ratio: 11 (ref 9–23)
BUN: 8 mg/dL (ref 6–24)
Bilirubin Total: 0.5 mg/dL (ref 0.0–1.2)
CO2: 21 mmol/L (ref 20–29)
Calcium: 9.4 mg/dL (ref 8.7–10.2)
Chloride: 105 mmol/L (ref 96–106)
Creatinine, Ser: 0.7 mg/dL (ref 0.57–1.00)
Globulin, Total: 2.4 g/dL (ref 1.5–4.5)
Glucose: 86 mg/dL (ref 70–99)
Potassium: 4.3 mmol/L (ref 3.5–5.2)
Sodium: 141 mmol/L (ref 134–144)
Total Protein: 6.7 g/dL (ref 6.0–8.5)
eGFR: 101 mL/min/1.73 (ref >59)

## 2024-05-23 LAB — LIPID PANEL
Chol/HDL Ratio: 4.2 ratio (ref 0.0–4.4)
Cholesterol, Total: 160 mg/dL (ref 100–199)
HDL: 38 mg/dL — ABNORMAL LOW (ref >39)
LDL Chol Calc (NIH): 96 mg/dL (ref 0–99)
Triglycerides: 146 mg/dL (ref 0–149)
VLDL Cholesterol Cal: 26 mg/dL (ref 5–40)

## 2024-05-23 LAB — TSH: TSH: 0.864 u[IU]/mL (ref 0.450–4.500)

## 2024-05-28 ENCOUNTER — Ambulatory Visit: Payer: Self-pay | Admitting: Family Medicine

## 2024-05-29 LAB — CYTOLOGY - PAP: Diagnosis: NEGATIVE

## 2024-06-13 DIAGNOSIS — M19071 Primary osteoarthritis, right ankle and foot: Secondary | ICD-10-CM | POA: Diagnosis not present

## 2024-06-13 DIAGNOSIS — M170 Bilateral primary osteoarthritis of knee: Secondary | ICD-10-CM | POA: Diagnosis not present

## 2024-06-13 DIAGNOSIS — M1712 Unilateral primary osteoarthritis, left knee: Secondary | ICD-10-CM | POA: Diagnosis not present

## 2024-06-13 DIAGNOSIS — M1711 Unilateral primary osteoarthritis, right knee: Secondary | ICD-10-CM | POA: Diagnosis not present

## 2024-07-04 ENCOUNTER — Encounter (INDEPENDENT_AMBULATORY_CARE_PROVIDER_SITE_OTHER): Admitting: Family Medicine

## 2024-07-04 DIAGNOSIS — R7303 Prediabetes: Secondary | ICD-10-CM

## 2024-07-04 NOTE — Progress Notes (Signed)
 Erroneous visit, patient did not need to come in so soon, will no charge her for this.

## 2024-07-09 ENCOUNTER — Other Ambulatory Visit: Payer: Self-pay | Admitting: Family Medicine

## 2024-07-09 MED ORDER — TRIAMCINOLONE ACETONIDE 0.1 % EX CREA: TOPICAL_CREAM | CUTANEOUS | 11 refills | Status: AC

## 2024-07-09 NOTE — Telephone Encounter (Signed)
 Copied from CRM [id number]. Topic: Clinical - Medication Refill >> Jul 09, 2024  9:52 AM Gustabo D wrote: Medication: triamcinolone  cream (KENALOG ) 0.1 %  Has the patient contacted their pharmacy? Yes says she need a new prescription (Agent: If no, request that the patient contact the pharmacy for the refill. If patient does not wish to contact the pharmacy document the reason why and proceed with request.) (Agent: If yes, when and what did the pharmacy advise?)  This is the patient's preferred pharmacy:  [id number] [place], [place] - [address] [address] [place] [address] Phone: [phone] Fax: [phone]    Is this the correct pharmacy for this prescription? Yes If no, delete pharmacy and type the correct one.   Has the prescription been filled recently? No  Is the patient out of the medication? Yes  Has the patient been seen for an appointment in the last year OR does the patient have an upcoming appointment? Yes  Can we respond through MyChart? No  Agent: Please be advised that Rx refills may take up to 3 business days. We ask that you follow-up with your pharmacy.

## 2024-09-15 ENCOUNTER — Encounter: Payer: Self-pay | Admitting: Family Medicine

## 2024-09-15 ENCOUNTER — Ambulatory Visit: Payer: Self-pay | Admitting: Family Medicine

## 2024-09-21 ENCOUNTER — Other Ambulatory Visit: Payer: Self-pay | Admitting: Family Medicine

## 2024-09-22 ENCOUNTER — Ambulatory Visit: Admitting: Family Medicine

## 2024-09-22 ENCOUNTER — Encounter: Payer: Self-pay | Admitting: Family Medicine

## 2024-09-24 ENCOUNTER — Ambulatory Visit (INDEPENDENT_AMBULATORY_CARE_PROVIDER_SITE_OTHER): Admitting: Family Medicine

## 2024-09-24 ENCOUNTER — Encounter: Payer: Self-pay | Admitting: Family Medicine

## 2024-09-24 VITALS — BP 128/84 | HR 96 | Temp 97.0°F | Ht 59.0 in | Wt 223.0 lb

## 2024-09-24 DIAGNOSIS — I10 Essential (primary) hypertension: Secondary | ICD-10-CM | POA: Diagnosis not present

## 2024-09-24 DIAGNOSIS — R7303 Prediabetes: Secondary | ICD-10-CM

## 2024-09-24 DIAGNOSIS — E550 Rickets, active: Secondary | ICD-10-CM

## 2024-09-24 DIAGNOSIS — E039 Hypothyroidism, unspecified: Secondary | ICD-10-CM

## 2024-09-24 DIAGNOSIS — M174 Other bilateral secondary osteoarthritis of knee: Secondary | ICD-10-CM

## 2024-09-24 MED ORDER — TRAMADOL HCL 50 MG PO TABS: 50.0000 mg | ORAL_TABLET | Freq: Four times a day (QID) | ORAL | 1 refills | Status: AC | PRN

## 2024-09-24 NOTE — Progress Notes (Signed)
 "  BP 128/84   Pulse 96   Temp (!) 97 F (36.1 C)   Ht 4' 11 (1.499 m)   Wt 223 lb (101.2 kg)   LMP 10/06/2013   SpO2 95%   BMI 45.04 kg/m    Subjective:   Patient ID: Rebecca Miles, female    DOB: [birth year], 59 y.o.   MRN: [id number]  HPI: Rebecca Miles is a 59 y.o. female presenting on 09/24/2024 for Medical Management of Chronic Issues, Hypothyroidism, and Osteoarthritis   Discussed the use of AI scribe software for clinical note transcription with the patient, who gave verbal consent to proceed.  History of Present Illness   [name] is a 59 year old female who presents for a recheck of her thyroid  condition.  Thyroid  dysfunction - Persistent nocturnal hot flashes unrelieved by current levothyroxine  therapy - Currently taking levothyroxine  for thyroid  condition - Questions whether hot flashes are related to thyroid  dysfunction or menopause  Menopausal symptoms - No menstrual periods for a prolonged duration, coinciding with initiation of thyroid  medication - No history of endometrial ablation or hysterectomy - History of tubal ligation after last childbirth, with some menstrual cycles following the procedure before complete cessation - Requires a fan at night for relief of hot flashes  Knee pain and osteoarthritis - Uses tramadol  as needed for knee pain, but infrequently - Uses turmeric for stiffness with perceived benefit - Completed a series of three gel injections for knees with good response  Balance disturbance - Occasional balance issues without significant concern  Chronic medical therapy - Currently taking blood pressure medication and pravastatin  for hyperlipidemia          Relevant past medical, surgical, family and social history reviewed and updated as indicated. Interim medical history since our last visit reviewed. Allergies and medications reviewed and updated.  Review of Systems  Constitutional:  Negative for chills and  fever.  Eyes:  Negative for visual disturbance.  Respiratory:  Negative for chest tightness and shortness of breath.   Cardiovascular:  Negative for chest pain and leg swelling.  Musculoskeletal:  Positive for arthralgias, gait problem and myalgias. Negative for back pain.  Skin:  Negative for rash.  Neurological:  Negative for dizziness, light-headedness and headaches.  Psychiatric/Behavioral:  Negative for agitation and behavioral problems.   All other systems reviewed and are negative.   Per HPI unless specifically indicated above   Allergies as of 09/24/2024   No Known Allergies      Medication List        Accurate as of September 24, 2024  4:22 PM. If you have any questions, ask your nurse or doctor.          acetaminophen  650 MG CR tablet Commonly known as: TYLENOL  Take 1,300 mg by mouth every 8 (eight) hours as needed for pain.   fluticasone  50 MCG/ACT nasal spray Commonly known as: FLONASE  Place 2 sprays into both nostrils daily.   levothyroxine  125 MCG tablet Commonly known as: SYNTHROID  Take 1 tablet (125 mcg total) by mouth daily before breakfast.   Magnesium 250 MG Tabs Take 250 mg by mouth daily.   multivitamin with minerals Tabs tablet Take 1 tablet by mouth daily.   olmesartan  20 MG tablet Commonly known as: BENICAR  Take 1 tablet (20 mg total) by mouth daily.   Phospha 250 Neutral [id number] MG Tabs TAKE 1 TABLET (250 MG TOTAL) BY MOUTH 4 (FOUR) TIMES DAILY - AFTER MEALS AND AT BEDTIME.  polyethylene glycol powder 17 GM/SCOOP powder Commonly known as: GLYCOLAX /MIRALAX  Take 17 g by mouth daily.   pravastatin  40 MG tablet Commonly known as: PRAVACHOL  Take 1 tablet (40 mg total) by mouth daily.   QC TUMERIC COMPLEX PO Take 1 tablet by mouth daily.   traMADol  50 MG tablet Commonly known as: ULTRAM  Take 1 tablet (50 mg total) by mouth every 6 (six) hours as needed for moderate pain (pain score 4-6).   triamcinolone  cream 0.1 % Commonly  known as: KENALOG  APPLY TO AFFECTED AREA TWICE A DAY         Objective:   BP 128/84   Pulse 96   Temp (!) 97 F (36.1 C)   Ht 4' 11 (1.499 m)   Wt 223 lb (101.2 kg)   LMP 10/06/2013   SpO2 95%   BMI 45.04 kg/m   Wt Readings from Last 3 Encounters:  09/24/24 223 lb (101.2 kg)  05/22/24 219 lb (99.3 kg)  04/03/24 221 lb (100.2 kg)    Physical Exam Physical Exam   VITALS: BP- 128/84 NECK: Thyroid  normal, no nodules. CHEST: Lungs clear to auscultation bilaterally. CARDIOVASCULAR: Heart regular rate and rhythm, no murmurs. EXTREMITIES: No edema in extremities.        Assessment & Plan:   Problem List Items Addressed This Visit       Cardiovascular and Mediastinum   Hypertension   Relevant Orders   Bayer DCA Hb A1c Waived   CMP14+EGFR   TSH     Endocrine   Hypothyroidism - Primary   Relevant Orders   Bayer DCA Hb A1c Waived   CMP14+EGFR   TSH     Musculoskeletal and Integument   OA (osteoarthritis) of knee   Relevant Medications   traMADol  (ULTRAM ) 50 MG tablet   Clinical rickets   Relevant Medications   traMADol  (ULTRAM ) 50 MG tablet     Other   Prediabetes   Relevant Orders   Bayer DCA Hb A1c Waived   CMP14+EGFR   TSH       Acquired hypothyroidism Well-managed with levothyroxine . Nocturnal hot flashes likely due to menopause, not thyroid  dysfunction. - Rechecked thyroid  levels to confirm stability.  Primary hypertension Well-controlled with current medication regimen. - Continue current antihypertensive medication.  Secondary osteoarthritis of both knees Managed with tramadol  as needed. Turmeric helps with stiffness. - Refilled tramadol  prescription for as-needed use. - Continue turmeric for anti-inflammatory effects.          Follow up plan: Return in about 3 months (around 12/23/2024), or if symptoms worsen or fail to improve, for And hypothyroidism and prediabetes.  Counseling provided for all of the vaccine components Orders  Placed This Encounter  Procedures   Bayer DCA Hb A1c Waived   CMP14+EGFR   TSH    Fonda Levins, MD [facility] 09/24/2024, 4:22 PM     "

## 2024-09-25 LAB — CMP14+EGFR
ALT: 13 IU/L (ref 0–32)
AST: 16 IU/L (ref 0–40)
Albumin: 4.3 g/dL (ref 3.8–4.9)
Alkaline Phosphatase: 147 IU/L — ABNORMAL HIGH (ref 49–135)
BUN/Creatinine Ratio: 12 (ref 9–23)
BUN: 8 mg/dL (ref 6–24)
Bilirubin Total: 0.4 mg/dL (ref 0.0–1.2)
CO2: 21 mmol/L (ref 20–29)
Calcium: 9.8 mg/dL (ref 8.7–10.2)
Chloride: 105 mmol/L (ref 96–106)
Creatinine, Ser: 0.66 mg/dL (ref 0.57–1.00)
Globulin, Total: 2.2 g/dL (ref 1.5–4.5)
Glucose: 87 mg/dL (ref 70–99)
Potassium: 4.6 mmol/L (ref 3.5–5.2)
Sodium: 140 mmol/L (ref 134–144)
Total Protein: 6.5 g/dL (ref 6.0–8.5)
eGFR: 102 mL/min/1.73

## 2024-09-25 LAB — BAYER DCA HB A1C WAIVED: HB A1C (BAYER DCA - WAIVED): 5.2 % (ref 4.8–5.6)

## 2024-09-25 LAB — TSH: TSH: 0.36 u[IU]/mL — ABNORMAL LOW (ref 0.450–4.500)

## 2024-10-01 ENCOUNTER — Ambulatory Visit: Payer: Self-pay | Admitting: Family Medicine

## 2024-10-07 ENCOUNTER — Telehealth: Payer: Self-pay

## 2024-10-07 NOTE — Telephone Encounter (Signed)
 Left message making pt aware that an order is in epic for a screening mammo. Advised pt to call back if the order needs to be faxed to a particular location.

## 2024-10-07 NOTE — Telephone Encounter (Signed)
 Copied from CRM [id number]. Topic: Clinical - Request for Lab/Test Order >> Oct 07, 2024 11:58 AM Hadassah PARAS wrote: Reason for CRM: Pt is req an order for a routine mammogram. Please advise pt on #[id number]

## 2024-12-24 ENCOUNTER — Ambulatory Visit: Admitting: Family Medicine

## 2025-03-06 ENCOUNTER — Ambulatory Visit: Payer: Self-pay
